# Patient Record
Sex: Female | Born: 1970 | Race: Black or African American | Hispanic: No | Smoking: Never smoker
Health system: Southern US, Community
[De-identification: ages and names within clinical notes are randomized; demographics above are authoritative.]

## PROBLEM LIST (undated history)

## (undated) DIAGNOSIS — N6019 Diffuse cystic mastopathy of unspecified breast: Secondary | ICD-10-CM

## (undated) DIAGNOSIS — Z8742 Personal history of other diseases of the female genital tract: Secondary | ICD-10-CM

## (undated) DIAGNOSIS — N809 Endometriosis, unspecified: Secondary | ICD-10-CM

## (undated) DIAGNOSIS — M179 Osteoarthritis of knee, unspecified: Secondary | ICD-10-CM

## (undated) DIAGNOSIS — E119 Type 2 diabetes mellitus without complications: Secondary | ICD-10-CM

## (undated) DIAGNOSIS — E785 Hyperlipidemia, unspecified: Secondary | ICD-10-CM

## (undated) DIAGNOSIS — K219 Gastro-esophageal reflux disease without esophagitis: Secondary | ICD-10-CM

## (undated) DIAGNOSIS — T7840XA Allergy, unspecified, initial encounter: Secondary | ICD-10-CM

## (undated) DIAGNOSIS — M1710 Unilateral primary osteoarthritis, unspecified knee: Secondary | ICD-10-CM

## (undated) DIAGNOSIS — O1490 Unspecified pre-eclampsia, unspecified trimester: Secondary | ICD-10-CM

## (undated) HISTORY — DX: Diffuse cystic mastopathy of unspecified breast: N60.19

## (undated) HISTORY — DX: Hyperlipidemia, unspecified: E78.5

## (undated) HISTORY — PX: DILATION AND CURETTAGE, DIAGNOSTIC / THERAPEUTIC: SUR384

## (undated) HISTORY — DX: Unspecified pre-eclampsia, unspecified trimester: O14.90

## (undated) HISTORY — DX: Gastro-esophageal reflux disease without esophagitis: K21.9

## (undated) HISTORY — DX: Osteoarthritis of knee, unspecified: M17.9

## (undated) HISTORY — DX: Type 2 diabetes mellitus without complications: E11.9

## (undated) HISTORY — PX: PARTIAL HYSTERECTOMY: SHX80

## (undated) HISTORY — DX: Endometriosis, unspecified: N80.9

## (undated) HISTORY — DX: Allergy, unspecified, initial encounter: T78.40XA

## (undated) HISTORY — DX: Personal history of other diseases of the female genital tract: Z87.42

## (undated) HISTORY — DX: Unilateral primary osteoarthritis, unspecified knee: M17.10

---

## 1998-10-14 ENCOUNTER — Other Ambulatory Visit: Admission: RE | Admit: 1998-10-14 | Discharge: 1998-10-14 | Payer: Self-pay | Admitting: Obstetrics and Gynecology

## 1999-12-17 ENCOUNTER — Other Ambulatory Visit: Admission: RE | Admit: 1999-12-17 | Discharge: 1999-12-17 | Payer: Self-pay | Admitting: Obstetrics and Gynecology

## 2000-11-12 ENCOUNTER — Other Ambulatory Visit: Admission: RE | Admit: 2000-11-12 | Discharge: 2000-11-12 | Payer: Self-pay | Admitting: *Deleted

## 2001-11-15 ENCOUNTER — Other Ambulatory Visit: Admission: RE | Admit: 2001-11-15 | Discharge: 2001-11-15 | Payer: Self-pay | Admitting: *Deleted

## 2002-03-02 ENCOUNTER — Ambulatory Visit (HOSPITAL_COMMUNITY): Admission: RE | Admit: 2002-03-02 | Discharge: 2002-03-02 | Payer: Self-pay | Admitting: Family Medicine

## 2003-02-02 ENCOUNTER — Encounter: Payer: Self-pay | Admitting: Family Medicine

## 2003-02-02 ENCOUNTER — Encounter: Admission: RE | Admit: 2003-02-02 | Discharge: 2003-02-02 | Payer: Self-pay | Admitting: Family Medicine

## 2003-03-12 ENCOUNTER — Other Ambulatory Visit: Admission: RE | Admit: 2003-03-12 | Discharge: 2003-03-12 | Payer: Self-pay | Admitting: *Deleted

## 2004-03-18 ENCOUNTER — Inpatient Hospital Stay (HOSPITAL_COMMUNITY): Admission: AD | Admit: 2004-03-18 | Discharge: 2004-03-18 | Payer: Self-pay | Admitting: Obstetrics and Gynecology

## 2004-03-21 ENCOUNTER — Encounter: Admission: RE | Admit: 2004-03-21 | Discharge: 2004-03-21 | Payer: Self-pay | Admitting: Obstetrics and Gynecology

## 2004-03-27 ENCOUNTER — Encounter: Admission: RE | Admit: 2004-03-27 | Discharge: 2004-04-26 | Payer: Self-pay | Admitting: Obstetrics and Gynecology

## 2004-05-06 ENCOUNTER — Other Ambulatory Visit: Admission: RE | Admit: 2004-05-06 | Discharge: 2004-05-06 | Payer: Self-pay | Admitting: Obstetrics and Gynecology

## 2004-07-01 HISTORY — PX: ABDOMINAL ADHESION SURGERY: SHX90

## 2004-07-22 ENCOUNTER — Ambulatory Visit (HOSPITAL_COMMUNITY): Admission: RE | Admit: 2004-07-22 | Discharge: 2004-07-22 | Payer: Self-pay | Admitting: Obstetrics and Gynecology

## 2005-05-11 ENCOUNTER — Other Ambulatory Visit: Admission: RE | Admit: 2005-05-11 | Discharge: 2005-05-11 | Payer: Self-pay | Admitting: Obstetrics and Gynecology

## 2006-05-03 ENCOUNTER — Encounter: Payer: Self-pay | Admitting: Internal Medicine

## 2006-05-03 LAB — CONVERTED CEMR LAB: Pap Smear: NORMAL

## 2006-05-20 ENCOUNTER — Other Ambulatory Visit: Admission: RE | Admit: 2006-05-20 | Discharge: 2006-05-20 | Payer: Self-pay | Admitting: Obstetrics and Gynecology

## 2008-05-31 ENCOUNTER — Encounter: Payer: Self-pay | Admitting: Internal Medicine

## 2008-05-31 DIAGNOSIS — K219 Gastro-esophageal reflux disease without esophagitis: Secondary | ICD-10-CM

## 2008-05-31 DIAGNOSIS — J45909 Unspecified asthma, uncomplicated: Secondary | ICD-10-CM

## 2008-05-31 DIAGNOSIS — J309 Allergic rhinitis, unspecified: Secondary | ICD-10-CM | POA: Insufficient documentation

## 2008-06-06 ENCOUNTER — Ambulatory Visit: Payer: Self-pay | Admitting: Internal Medicine

## 2008-06-06 DIAGNOSIS — J069 Acute upper respiratory infection, unspecified: Secondary | ICD-10-CM | POA: Insufficient documentation

## 2008-06-27 ENCOUNTER — Telehealth (INDEPENDENT_AMBULATORY_CARE_PROVIDER_SITE_OTHER): Payer: Self-pay | Admitting: *Deleted

## 2008-06-27 DIAGNOSIS — N63 Unspecified lump in unspecified breast: Secondary | ICD-10-CM

## 2008-07-04 ENCOUNTER — Ambulatory Visit: Payer: Self-pay | Admitting: Internal Medicine

## 2008-07-04 LAB — HM MAMMOGRAPHY

## 2009-02-05 ENCOUNTER — Ambulatory Visit: Payer: Self-pay | Admitting: Internal Medicine

## 2009-02-05 DIAGNOSIS — R21 Rash and other nonspecific skin eruption: Secondary | ICD-10-CM | POA: Insufficient documentation

## 2009-02-05 DIAGNOSIS — J209 Acute bronchitis, unspecified: Secondary | ICD-10-CM

## 2009-07-17 ENCOUNTER — Ambulatory Visit: Payer: Self-pay | Admitting: Internal Medicine

## 2009-07-17 DIAGNOSIS — G4710 Hypersomnia, unspecified: Secondary | ICD-10-CM | POA: Insufficient documentation

## 2009-07-17 DIAGNOSIS — H1030 Unspecified acute conjunctivitis, unspecified eye: Secondary | ICD-10-CM | POA: Insufficient documentation

## 2009-07-18 LAB — CONVERTED CEMR LAB
Albumin: 3.7 g/dL (ref 3.5–5.2)
Alkaline Phosphatase: 53 units/L (ref 39–117)
BUN: 9 mg/dL (ref 6–23)
Basophils Relative: 0 % (ref 0.0–3.0)
Bilirubin Urine: NEGATIVE
CO2: 26 meq/L (ref 19–32)
Calcium: 9.3 mg/dL (ref 8.4–10.5)
Glucose, Bld: 89 mg/dL (ref 70–99)
HCT: 41.3 % (ref 36.0–46.0)
HDL: 67.9 mg/dL (ref >39.00)
Hemoglobin, Urine: NEGATIVE
Ketones, ur: NEGATIVE mg/dL
LDL Cholesterol: 45 mg/dL (ref 0–99)
Lymphocytes Relative: 42.1 % (ref 12.0–46.0)
Lymphs Abs: 3.1 10*3/uL (ref 0.7–4.0)
MCHC: 34.2 g/dL (ref 30.0–36.0)
Monocytes Relative: 6.5 % (ref 3.0–12.0)
Neutro Abs: 3.7 10*3/uL (ref 1.4–7.7)
Neutrophils Relative %: 50.3 % (ref 43.0–77.0)
Platelets: 310 10*3/uL (ref 150.0–400.0)
RBC: 4.68 M/uL (ref 3.87–5.11)
RDW: 12 % (ref 11.5–14.6)
Sodium: 141 meq/L (ref 135–145)
TSH: 1.49 microintl units/mL (ref 0.35–5.50)
Total Bilirubin: 0.6 mg/dL (ref 0.3–1.2)
Total CHOL/HDL Ratio: 2
Total Protein, Urine: NEGATIVE mg/dL
Triglycerides: 144 mg/dL (ref 0.0–149.0)
Urine Glucose: NEGATIVE mg/dL

## 2009-07-30 ENCOUNTER — Ambulatory Visit: Payer: Self-pay | Admitting: Pulmonary Disease

## 2009-08-20 ENCOUNTER — Ambulatory Visit (HOSPITAL_BASED_OUTPATIENT_CLINIC_OR_DEPARTMENT_OTHER): Admission: RE | Admit: 2009-08-20 | Discharge: 2009-08-20 | Payer: Self-pay | Admitting: Pulmonary Disease

## 2009-08-20 ENCOUNTER — Encounter: Payer: Self-pay | Admitting: Pulmonary Disease

## 2009-08-26 ENCOUNTER — Ambulatory Visit: Payer: Self-pay | Admitting: Pulmonary Disease

## 2009-11-29 ENCOUNTER — Ambulatory Visit (HOSPITAL_COMMUNITY): Admission: RE | Admit: 2009-11-29 | Discharge: 2009-11-29 | Payer: Self-pay | Admitting: Obstetrics and Gynecology

## 2009-11-29 ENCOUNTER — Encounter (INDEPENDENT_AMBULATORY_CARE_PROVIDER_SITE_OTHER): Payer: Self-pay | Admitting: Obstetrics and Gynecology

## 2010-02-21 ENCOUNTER — Ambulatory Visit: Payer: Self-pay | Admitting: Internal Medicine

## 2010-02-21 DIAGNOSIS — M545 Low back pain, unspecified: Secondary | ICD-10-CM | POA: Insufficient documentation

## 2010-02-21 DIAGNOSIS — M62838 Other muscle spasm: Secondary | ICD-10-CM

## 2010-02-21 DIAGNOSIS — R10819 Abdominal tenderness, unspecified site: Secondary | ICD-10-CM

## 2010-02-21 DIAGNOSIS — R509 Fever, unspecified: Secondary | ICD-10-CM

## 2010-02-21 LAB — CONVERTED CEMR LAB
Bilirubin Urine: NEGATIVE
Ketones, ur: NEGATIVE mg/dL
Leukocytes, UA: NEGATIVE
Nitrite: NEGATIVE
Specific Gravity, Urine: 1.015 (ref 1.000–1.030)
Total Protein, Urine: NEGATIVE mg/dL
Urobilinogen, UA: 0.2 (ref 0.0–1.0)

## 2010-04-07 ENCOUNTER — Telehealth (INDEPENDENT_AMBULATORY_CARE_PROVIDER_SITE_OTHER): Payer: Self-pay | Admitting: *Deleted

## 2010-04-21 ENCOUNTER — Encounter
Admission: RE | Admit: 2010-04-21 | Discharge: 2010-04-21 | Payer: Self-pay | Admitting: Physical Medicine and Rehabilitation

## 2010-07-07 ENCOUNTER — Ambulatory Visit: Payer: Self-pay | Admitting: Internal Medicine

## 2010-07-07 LAB — CONVERTED CEMR LAB
Albumin: 3.7 g/dL (ref 3.5–5.2)
Alkaline Phosphatase: 55 units/L (ref 39–117)
Basophils Absolute: 0 10*3/uL (ref 0.0–0.1)
Basophils Relative: 0.4 % (ref 0.0–3.0)
Bilirubin Urine: NEGATIVE
CO2: 26 meq/L (ref 19–32)
Chloride: 107 meq/L (ref 96–112)
Cholesterol: 152 mg/dL (ref 0–200)
Creatinine, Ser: 1 mg/dL (ref 0.4–1.2)
Eosinophils Absolute: 0.1 10*3/uL (ref 0.0–0.7)
HDL: 47.2 mg/dL (ref >39.00)
Hemoglobin: 14.5 g/dL (ref 12.0–15.0)
Leukocytes, UA: NEGATIVE
Lymphocytes Relative: 38.3 % (ref 12.0–46.0)
Lymphs Abs: 2.7 10*3/uL (ref 0.7–4.0)
MCHC: 34.5 g/dL (ref 30.0–36.0)
MCV: 89.8 fL (ref 78.0–100.0)
Platelets: 319 10*3/uL (ref 150.0–400.0)
Sodium: 142 meq/L (ref 135–145)
TSH: 1.49 microintl units/mL (ref 0.35–5.50)
Triglycerides: 73 mg/dL (ref 0.0–149.0)
Urobilinogen, UA: 0.2 (ref 0.0–1.0)
VLDL: 14.6 mg/dL (ref 0.0–40.0)
WBC: 7 10*3/uL (ref 4.5–10.5)
pH: 7 (ref 5.0–8.0)

## 2010-07-14 ENCOUNTER — Ambulatory Visit: Payer: Self-pay | Admitting: Internal Medicine

## 2010-07-14 DIAGNOSIS — M1710 Unilateral primary osteoarthritis, unspecified knee: Secondary | ICD-10-CM

## 2010-09-30 NOTE — Assessment & Plan Note (Signed)
Summary: 5 mos f/u // cd   Vital Signs:  Patient profile:   40 year old female Height:      64.5 inches Weight:      196.25 pounds BMI:     33.29 O2 Sat:      98 % on Room air Temp:     98.9 degrees F oral Pulse rate:   87 / minute BP sitting:   106 / 70  (left arm) Cuff size:   regular  Vitals Entered By: Zella Ball Ewing CMA Duncan Dull) (July 14, 2010 4:06 PM)  O2 Flow:  Room air  CC: 5 month followup/RE   Primary Care Provider:  Corwin Levins MD  CC:  5 month followup/RE.  History of Present Illness: here for wellness, overall doing well,  Pt denies CP, worsening sob, doe, wheezing, orthopnea, pnd, worsening LE edema, palps, dizziness or syncope  Pt denies new neuro symptoms such as headache, facial or extremity weakness  Pt denies polydipsia, polyuria   Overall good compliance with meds, trying to follow low chol diet, wt stable, little excercise however No fever, wt loss, night sweats, loss of appetite or other constitutional symptoms  Denies worsening depressive symptoms, suicidal ideation, or panic.  Pt states good ability with ADL's, low fall risk, home safety reviewed and adequate, no significant change in hearing or vision, trying to follow lower chol diet, and occasionally active only with regular excercise.   Preventive Screening-Counseling & Management      Drug Use:  no.    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 2 Puffs Qid As Needed 2)  Xyzal 5 Mg Tabs (Levocetirizine Dihydrochloride) .Marland Kitchen.. 1 By Mouth Once Daily As Needed 3)  Advair Diskus 250-50 Mcg/dose Misc (Fluticasone-Salmeterol) .Marland Kitchen.. 1 Puff Two Times A Day As Needed 4)  Fluticasone Propionate 50 Mcg/act Susp (Fluticasone Propionate) .... 2 Spray/side Once Daily As Needed 5)  Gianvi 3-0.02 Mg Tabs (Drospirenone-Ethinyl Estradiol) .... Use As Directed 6)  Tramadol Hcl 50 Mg Tabs (Tramadol Hcl) .Marland Kitchen.. 1-2 By Mouth Q 6 Hrs As Needed 7)  Carisoprodol 350 Mg Tabs (Carisoprodol) .Marland Kitchen.. 1 By Mouth Q 6 Hrs As Needed Spasm 8)  Prednisone 10 Mg Tabs (Prednisone) .... 3po Qd For 3days, Then 2po Qd For 3days, Then 1po Qd For 3days, Then Stop  Current Medications (verified): 1)  Proair Hfa 108 (90 Base) Mcg/act Aers (Albuterol Sulfate) .... 2 Puffs Qid As Needed 2)  Xyzal 5 Mg Tabs (Levocetirizine Dihydrochloride) .Marland Kitchen.. 1 By Mouth Once Daily As Needed 3)  Advair Diskus 250-50 Mcg/dose Misc (Fluticasone-Salmeterol) .Marland Kitchen.. 1 Puff Two Times A Day As Needed 4)  Fluticasone Propionate 50 Mcg/act Susp (Fluticasone Propionate) .... 2 Spray/side Once Daily As Needed 5)  Tramadol Hcl 50 Mg Tabs (Tramadol Hcl) .Marland Kitchen.. 1-2 By Mouth Q 6 Hrs As Needed 6)  Norethindrone Acetate 5 Mg Tabs (Norethindrone Acetate) .Marland Kitchen.. 1 By Mouth  Once Daily  Allergies (verified): 1)  ! Sulfa 2)  ! 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 Married Drug use-no  Risk  Factors: Smoking Status: never (05/31/2008)  Past Medical History: Allergic rhinitis GERD fibrocystic breast disease Asthma hx of preeclampsia hx of endometriosis left knee DJD  Social History: Never Smoked 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         all otherwise negative per pt -    Physical Exam  General:  alert and overweight-appearing.  , not ill appearing Head:  normocephalic and atraumatic.   Eyes:  vision grossly intact, pupils equal, and pupils round.   Ears:  R ear normal and L ear normal.   Nose:  no external deformity and no nasal discharge.   Mouth:  no gingival abnormalities and pharynx pink and moist.   Neck:  supple and no masses.   Lungs:  normal respiratory effort and normal breath sounds.   Heart:  normal rate and regular rhythm.   Abdomen:  soft and normal bowel sounds.  non-tender, no hepatomegaly, and no splenomegaly.   Msk:  no joint tenderness and no joint swelling., mild left knee crepitus noted Extremities:  no edema, no erythema  Neurologic:  cranial nerves II-XII intact, strength normal in all extremities, and gait normal.   Skin:  color normal and no rashes.   Psych:  not anxious appearing and not depressed appearing.     Impression & Recommendations:  Problem # 1:  Preventive Health Care (ICD-V70.0) Overall doing well, age appropriate education and counseling updated, referral for preventive services and immunizations addressed, dietary  counseling and smoking status adressed , most recent labs reviewed I have personally reviewed and have noted 1.The patient's medical and social history 2.Their use of alcohol, tobacco or illicit drugs 3.Their current medications and supplements 4. Functional ability including ADL's, fall risk, home safety risk, hearing & visual impairment  5.Diet and physical activities 6.Evidence for depression or mood disorders The patients weight, height, BMI  have been recorded in the chart I have made referrals, counseling and provided education to the patient based review of the above   Problem # 2:  OSTEOARTHRITIS, KNEE, LEFT (ICD-715.96)  The following medications were removed from the medication list:    Carisoprodol 350 Mg Tabs (Carisoprodol) .Marland Kitchen... 1 by mouth q 6 hrs as needed spasm Her updated medication list for this problem includes:    Tramadol Hcl 50 Mg Tabs (Tramadol hcl) .Marland Kitchen... 1-2 by mouth q 6 hrs as needed treat as above, f/u any worsening signs or symptoms   Complete Medication List: 1)  Proair Hfa 108 (90 Base) Mcg/act Aers (Albuterol sulfate) .... 2 puffs qid as needed 2)  Xyzal 5 Mg Tabs (Levocetirizine dihydrochloride) .Marland Kitchen.. 1 by mouth once daily as needed 3)  Advair Diskus 250-50 Mcg/dose Misc (Fluticasone-salmeterol) .Marland Kitchen.. 1 puff two times a day as needed 4)  Fluticasone Propionate 50 Mcg/act Susp (Fluticasone propionate) .... 2 spray/side once daily as needed 5)  Tramadol Hcl 50 Mg Tabs (Tramadol hcl) .Marland Kitchen.. 1-2 by mouth q 6 hrs as needed 6)  Norethindrone Acetate 5 Mg Tabs (Norethindrone acetate) .Marland Kitchen.. 1 by mouth once daily  Patient Instructions: 1)  Continue all previous medications as before this visit  2)  Your blood work is forwarded to your GYN 3)  Your prevention is up to date 4)  Please follow up with GYN for further bleeding 5)  Please schedule a follow-up appointment in 1 year, or sooner if needed. with CPX and labs Prescriptions: TRAMADOL HCL 50 MG TABS (TRAMADOL HCL)  1-2 by mouth q 6 hrs as needed  #60 x 2   Entered and Authorized by:   Corwin Levins MD   Signed by:   Corwin Levins MD on 07/14/2010   Method used:   Electronically to        [facility]  [address] [phone]* (retail)       [address]       [place], [place]  [address]       Ph: [phone]       Fax: [phone]   RxID:   [id number] ADVAIR DISKUS 250-50 MCG/DOSE MISC (FLUTICASONE-SALMETEROL) 1 puff two times a day as needed  #1 x 11   Entered and Authorized by:   Corwin Levins MD   Signed by:   Corwin Levins MD on 07/14/2010   Method used:   [facility]ronically to        [facility]  [address] [phone]* (retail)       [address]       [place], [place]  [address]       Ph: [phone]       Fax: [phone]   RxID:   [id number] PROAIR HFA 108 (90 BASE) MCG/ACT AERS (ALBUTEROL SULFATE) 2 puffs qid as needed  #1 x 11   Entered and Authorized by:   Corwin Levins MD   Signed by:   Corwin Levins MD on 07/14/2010   Method used:   [facility] to        [facility]  [address] [phone]* (retail)       [address]       [place], [place]  [address]       Ph: [phone]       Fax: [phone]   RxID:   [id number]    Orders Added: 1)  Est. Patient 18-39 years [[id number]]

## 2010-09-30 NOTE — Assessment & Plan Note (Signed)
Summary: HIP PAIN   MUSCLE SPASMS--STC   Vital Signs:  Patient profile:   40 year old female Height:      65 inches Weight:      195.50 pounds BMI:     32.65 O2 Sat:      99 % on Room air Temp:     99.8 degrees F oral Pulse rate:   92 / minute BP sitting:   122 / 84  (left arm) Cuff size:   regular  Vitals Entered By: Zella Ball Ewing CMA Duncan Dull) (February 21, 2010 2:40 PM)  O2 Flow:  Room air CC: left hip pain/RE   Primary Care Provider:  Corwin Levins MD  CC:  left hip pain/RE.  History of Present Illness: here after slipped on treadmill in august 2010 but did not fall but was jerked to the left; now with recurrent Left lower back pain for one year, exepcially worse in the past 1 wk, now moderate with some radiation to the left post and lat thigh area (not below the knee);  pain worse to get up from sitting and not assoc with worsening LE pain otherwise, numbness or weakness;  no bowel or bladder changes, no gait change or fall,  no subjective fever, wt loss, night sweats, or other constitutional symtpoms.  Did have some tingling to left toes today but not sure if related.  Alleve helps pain.  Better to lie down or sit as well.  She is unaware of any fever today, chills n/v abd pain.   Does c/o also of intermittent muscular cramps that seem to be random, but persitent since prior to last visit as well, often to the calves, but several to the right upper arm as well.  Pt denies CP, sob, doe, wheezing, orthopnea, pnd, worsening LE edema, palps, dizziness or syncope  Pt denies new neuro symptoms such as headache, facial or extremity weakness  No recent reduced by mouth intake.  had similar symptoms nov 2010 and labs reviewed with pt.  Needs inhaler refill, but no sob, wheezing or nighttime awakening.  Also specifically denies GU symtpoms such as pain on urination, urgency or freq.  No t currently having menses.  NO bowel changes or constipation.    Problems Prior to Update: 1)  Abdominal Tenderness   (ICD-789.60) 2)  Back Pain, Lumbar  (ICD-724.2) 3)  Hypersomnia  (ICD-780.54) 4)  Conjunctivitis, Acute, Right  (ICD-372.00) 5)  Preventive Health Care  (ICD-V70.0) 6)  Rash-nonvesicular  (ICD-782.1) 7)  Acute Bronchitis  (ICD-466.0) 8)  Breast Mass  (ICD-611.72) 9)  Uri  (ICD-465.9) 10)  Asthma  (ICD-493.90) 11)  Gerd  (ICD-530.81) 12)  Allergic Rhinitis  (ICD-477.9)  Medications Prior to Update: 1)  Proair Hfa 108 (90 Base) Mcg/act Aers (Albuterol Sulfate) .... 2 Puffs Qid As Needed 2)  Xyzal 5 Mg Tabs (Levocetirizine Dihydrochloride) .Marland Kitchen.. 1 By Mouth Once Daily As Needed 3)  Advair Diskus 250-50 Mcg/dose Misc (Fluticasone-Salmeterol) .Marland Kitchen.. 1 Puff Two Times A Day As Needed 4)  Fluticasone Propionate 50 Mcg/act Susp (Fluticasone Propionate) .... 2 Spray/side Once Daily As Needed 5)  Gianvi 3-0.02 Mg Tabs (Drospirenone-Ethinyl Estradiol) .... Use As Directed  Current Medications (verified): 1)  Proair Hfa 108 (90 Base) Mcg/act Aers (Albuterol Sulfate) .... 2 Puffs Qid As Needed 2)  Xyzal 5 Mg Tabs (Levocetirizine Dihydrochloride) .Marland Kitchen.. 1 By Mouth Once Daily As Needed 3)  Advair Diskus 250-50 Mcg/dose Misc (Fluticasone-Salmeterol) .Marland Kitchen.. 1 Puff Two Times A Day As Needed 4)  Fluticasone  Propionate 50 Mcg/act Susp (Fluticasone Propionate) .... 2 Spray/side Once Daily As Needed 5)  Gianvi 3-0.02 Mg Tabs (Drospirenone-Ethinyl Estradiol) .... Use As Directed 6)  Tramadol Hcl 50 Mg Tabs (Tramadol Hcl) .Marland Kitchen.. 1-2 By Mouth Q 6 Hrs As Needed 7)  Carisoprodol 350 Mg Tabs (Carisoprodol) .Marland Kitchen.. 1 By Mouth Q 6 Hrs As Needed Spasm 8)  Prednisone 10 Mg Tabs (Prednisone) .... 3po Qd For 3days, Then 2po Qd For 3days, Then 1po Qd For 3days, Then Stop  Allergies (verified): 1)  ! Sulfa 2)  ! 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  , not ill appearing Head:  normocephalic and atraumatic.   Eyes:  vision grossly intact, pupils equal, and pupils round.   Ears:  R ear normal and L ear normal.   Nose:  no external deformity and no nasal discharge.   Mouth:  no gingival abnormalities and pharynx pink and moist.   Neck:  supple and no masses.   Lungs:  normal respiratory effort and normal breath sounds.   Heart:  normal rate and regular rhythm.   Abdomen:  soft and normal bowel sounds.  , with lower mid abd tender without guarding or rebound Msk:  no joint tenderness and no joint swelling.  , but has tender over left SI joint area, no spine tender or significant paravertebral tenderness except for marked left lumbar paravertebral spasm/tender Pulses:  2+ LE's Extremities:  no edema, no erythema  Neurologic:  cranial nerves II-XII intact, strength normal in lowerextremities, and sensation intact to light touc except for ? mild prox LE weakness to hip flexion but exam limited due to pain   Impression & Recommendations:  Problem # 1:  BACK PAIN, LUMBAR (ICD-724.2)  c/w left sciatica with ? mild LLE weakness , but also has lumbar left spasm by exam , doubt needs MRI at this time - for medical tx for now, f/u any worsening symptoms  Her updated medication list for this problem includes:    Tramadol Hcl 50 Mg Tabs (Tramadol hcl) .Marland Kitchen... 1-2 by mouth q 6 hrs as needed    Carisoprodol 350 Mg Tabs (Carisoprodol) .Marland Kitchen... 1 by mouth q 6 hrs as needed spasm  Problem # 2:  MUSCLE SPASM (ICD-728.85) Assessment: Unchanged  suspect  due to overall deconditioning,  labs revewed with pt, declines repeat at this time;  tx with different muscle relaxer  Problem # 3:  ABDOMINAL TENDERNESS (ICD-789.60)  low mid abd, with fever but o/w asympt  - for urine studies, and antibx pending urine studies  Orders: T-Culture, Urine ([id number]) TLB-Udip w/ Micro (81001-URINE)  Problem # 4:  FEVER UNSPECIFIED (ICD-780.60) Assessment: Unchanged low grade, exam o/w benign except for above, ok to follow  Complete Medication List: 1)  Proair Hfa 108 (90 Base) Mcg/act Aers (Albuterol sulfate) .... 2 puffs qid as needed 2)  Xyzal 5 Mg Tabs (Levocetirizine dihydrochloride) .Marland Kitchen.. 1 by mouth once daily as needed 3)  Advair Diskus 250-50 Mcg/dose Misc (Fluticasone-salmeterol) .Marland KitchenMarland KitchenMarland Kitchen  1 puff two times a day as needed 4)  Fluticasone Propionate 50 Mcg/act Susp (Fluticasone propionate) .... 2 spray/side once daily as needed 5)  Gianvi 3-0.02 Mg Tabs (Drospirenone-ethinyl estradiol) .... Use as directed 6)  Tramadol Hcl 50 Mg Tabs (Tramadol hcl) .Marland Kitchen.. 1-2 by mouth q 6 hrs as needed 7)  Carisoprodol 350 Mg Tabs (Carisoprodol) .Marland Kitchen.. 1 by mouth q 6 hrs as needed spasm 8)  Prednisone 10 Mg Tabs (Prednisone) .... 3po qd for 3days, then 2po qd for 3days, then 1po qd for 3days, then stop  Patient Instructions: 1)  Please take all new medications as prescribed 2)  Continue all previous medications as before this visit  3)  Please go to the Lab in the basement for your urine tests today 4)  Please schedule a follow-up appointment in 5 months with CPX labs Prescriptions: ADVAIR DISKUS 250-50 MCG/DOSE MISC (FLUTICASONE-SALMETEROL) 1 puff two times a day as needed  #1 x 11   Entered and Authorized by:   Corwin Levins MD   Signed by:   Corwin Levins MD on 02/22/2010   Method used:   [facility] to        [facility]  [address] [phone]* (retail)       [address]       [place], [place]  [address]       Ph: [phone]       Fax: [phone]   RxID:    [id number] PROAIR HFA 108 (90 BASE) MCG/ACT AERS (ALBUTEROL SULFATE) 2 puffs qid as needed  #1 x 11   Entered and Authorized by:   Corwin Levins MD   Signed by:   Corwin Levins MD on 02/22/2010   Method used:   [facility]        [facility]  [address] [id number] ([facility]ail)       [address]       [place], [place]  [address]       Ph: [phone]       Fax: [phone]   RxID:   [id number] PREDNISONE 10 MG TABS (PREDNISONE) 3po qd for 3days, then 2po qd for 3days, then 1po qd for 3days, then stop  #18 x 0   Entered and Authorized by:   Corwin Levins MD   Signed by:   Corwin Levins MD on 02/21/2010   Method used:   Print then Give to Patient   RxID:   [id number] CARISOPRODOL 350 MG TABS (CARISOPRODOL) 1 by mouth q 6 hrs as needed spasm  #60 x 2   Entered and Authorized by:   Corwin Levins MD   Signed by:   Corwin Levins MD on 02/21/2010   Method used:   Print then Give to Patient   RxID:   [id number] TRAMADOL HCL 50 MG TABS (TRAMADOL HCL) 1-2 by mouth q 6 hrs as needed  #60 x 2   Entered and Authorized by:   Corwin Levins MD   Signed by:   Corwin Levins MD on 02/21/2010   Method used:   Print then Give to Patient   RxID:   [id number]

## 2010-09-30 NOTE — Progress Notes (Signed)
  Phone Note Other Incoming   Request: Send information Action Taken: Software engineer of Call: Request for records received from [employer]. Request forwarded to [facility].

## 2010-11-23 LAB — CBC
Hemoglobin: 14.5 g/dL (ref 12.0–15.0)
RBC: 4.83 MIL/uL (ref 3.87–5.11)
RDW: 13.6 % (ref 11.5–15.5)

## 2010-11-23 LAB — PREGNANCY, URINE: Preg Test, Ur: NEGATIVE

## 2010-12-24 ENCOUNTER — Telehealth: Payer: Self-pay

## 2010-12-24 MED ORDER — FLUTICASONE PROPIONATE 50 MCG/ACT NA SUSP: 2.0000 | Freq: Every day | NASAL | Status: DC

## 2010-12-24 MED ORDER — FEXOFENADINE HCL 180 MG PO TABS: 180.0000 mg | ORAL_TABLET | Freq: Every day | ORAL | Status: DC

## 2010-12-24 NOTE — Telephone Encounter (Signed)
Pt called stating she has been experiencing severe allergy sxs x 1-2 months. Pt says she has tried several OTC medication with no help and is requesting prescription strength medication to pharmacy

## 2010-12-24 NOTE — Telephone Encounter (Signed)
Done per emr 

## 2010-12-25 NOTE — Telephone Encounter (Signed)
Pt advised of Rxs and pharmacy

## 2010-12-26 ENCOUNTER — Encounter: Payer: Self-pay | Admitting: Internal Medicine

## 2010-12-26 ENCOUNTER — Ambulatory Visit (INDEPENDENT_AMBULATORY_CARE_PROVIDER_SITE_OTHER): Payer: BC Managed Care – PPO | Admitting: Internal Medicine

## 2010-12-26 VITALS — BP 106/78 | HR 97 | Temp 99.0°F | Ht 65.0 in | Wt 193.2 lb

## 2010-12-26 DIAGNOSIS — M629 Disorder of muscle, unspecified: Secondary | ICD-10-CM

## 2010-12-26 DIAGNOSIS — Z0001 Encounter for general adult medical examination with abnormal findings: Secondary | ICD-10-CM | POA: Insufficient documentation

## 2010-12-26 DIAGNOSIS — Z0000 Encounter for general adult medical examination without abnormal findings: Secondary | ICD-10-CM | POA: Insufficient documentation

## 2010-12-26 DIAGNOSIS — J45909 Unspecified asthma, uncomplicated: Secondary | ICD-10-CM

## 2010-12-26 DIAGNOSIS — M6289 Other specified disorders of muscle: Secondary | ICD-10-CM

## 2010-12-26 DIAGNOSIS — J019 Acute sinusitis, unspecified: Secondary | ICD-10-CM | POA: Insufficient documentation

## 2010-12-26 DIAGNOSIS — J309 Allergic rhinitis, unspecified: Secondary | ICD-10-CM

## 2010-12-26 MED ORDER — CYCLOBENZAPRINE HCL 5 MG PO TABS: 5.0000 mg | ORAL_TABLET | Freq: Three times a day (TID) | ORAL | Status: AC | PRN

## 2010-12-26 MED ORDER — CEPHALEXIN 500 MG PO CAPS: 500.0000 mg | ORAL_CAPSULE | Freq: Four times a day (QID) | ORAL | Status: AC

## 2010-12-26 NOTE — Patient Instructions (Addendum)
Take all new medications as prescribed - the antibiotic, and the muscle relaxer Continue all other medications as before Please return in Nov 2012 with Lab testing done 3-5 days before

## 2010-12-27 ENCOUNTER — Encounter: Payer: Self-pay | Admitting: Internal Medicine

## 2010-12-27 NOTE — Assessment & Plan Note (Signed)
stable overall by hx and exam, most recent lab reviewed with pt, and pt to continue medical treatment as before Lab Results  Component Value Date   WBC 7.0 07/07/2010   HGB 14.5 07/07/2010   HCT 42.0 07/07/2010   PLT 319.0 07/07/2010   CHOL 152 07/07/2010   TRIG 73.0 07/07/2010   HDL 47.20 07/07/2010   ALT 19 07/07/2010   AST 21 07/07/2010   NA 142 07/07/2010   K 5.2* 07/07/2010   CL 107 07/07/2010   CREATININE 1.0 07/07/2010   BUN 8 07/07/2010   CO2 26 07/07/2010   TSH 1.49 07/07/2010

## 2010-12-27 NOTE — Assessment & Plan Note (Signed)
Mild to mod, for antibx course,  to f/u any worsening symptoms or concerns 

## 2010-12-27 NOTE — Progress Notes (Signed)
Subjective:    Patient ID: Claire Irwin, female    DOB: Jan 16, 1971, 40 y.o.   MRN: [id number]  HPI   Here with 3 days acute onset fever, facial pain, pressure, general weakness and malaise, and greenish d/c, with slight ST, but little to no cough and Pt denies chest pain, increased sob or doe, wheezing, orthopnea, PND, increased LE swelling, palpitations, dizziness or syncope.  Also with hamstring tightness and spasm occasionally to the left post thigh con's to recur over the last 6 mo for no apparent reason such as overexertion, repetition movement, trauma.; overall mild but cont;s to be bothersome.  Pt denies recurring LBP or bowel or bladder change, fever, wt loss,  worsening LE pain/numbness/weakness, gait change or falls.  Also with mild increased nasal allergy symptoms over the past month, without wheezing or night time awakening, overall well controlled with more regular use of her allergy meds.  Pt denies chest pain, increased sob or doe, wheezing, orthopnea, PND, increased LE swelling, palpitations, dizziness or syncope.  Pt denies new neurological symptoms such as new headache, or facial or extremity weakness or numbness   Pt denies polydipsia, polyuria.  Overall good compliance with treatment, and good medicine tolerability.  Past Medical History  Diagnosis Date  . Allergy   . GERD (gastroesophageal reflux disease)   . Asthma   . Fibrocystic breast disease   . History of endometriosis   . Preeclampsia   . DJD (degenerative joint disease) of knee     LEFT   Past Surgical History  Procedure Date  . Cesarean section 02/2004  . Dilation and curettage, diagnostic / therapeutic   . Abdominal adhesion surgery 07/2004    reports that she has never smoked. She does not have any smokeless tobacco history on file. She reports that she does not drink alcohol or use illicit drugs. family history includes Allergies in her maternal grandmother and mother; Asthma in her maternal grandmother  and mother; Cancer in her paternal grandmother; Diabetes in her father and mother; Heart disease in her paternal uncle; and Hypertension in her mother. Allergies  Allergen Reactions  . Hydrocodone   . Sulfonamide Derivatives    Current Outpatient Prescriptions on File Prior to Visit  Medication Sig Dispense Refill  . fexofenadine (ALLEGRA) 180 MG tablet Take 1 tablet (180 mg total) by mouth daily.  30 tablet  2  . fluticasone (FLONASE) 50 MCG/ACT nasal spray 2 sprays by Nasal route daily.  16 g  2   Review of Systems Review of Systems  Constitutional: Negative for diaphoresis and unexpected weight change.  HENT: Negative for drooling and tinnitus.   Eyes: Negative for photophobia and visual disturbance.  Respiratory: Negative for choking and stridor.   Gastrointestinal: Negative for vomiting and blood in stool.  Genitourinary: Negative for hematuria and decreased urine volume.  Musculoskeletal: Negative for gait problem.  Skin: Negative for color change and wound.  Neurological: Negative for tremors and numbness.  Psychiatric/Behavioral: Negative for decreased concentration. The patient is not hyperactive.       Objective:   Physical Exam BP 106/78  Pulse 97  Temp(Src) 99 F (37.2 C) (Oral)  Ht 5\' 5"  (1.651 m)  Wt 193 lb 4 oz (87.658 kg)  BMI 32.16 kg/m2  SpO2 98% Physical Exam  VS noted Constitutional: Pt appears well-developed and well-nourished.  HENT: Head: Normocephalic.  Bilat tm's mild erythema.  Sinus tender.  Pharynx mild erythema Right Ear: External ear normal.  Left Ear: External ear normal.  Eyes: Conjunctivae and EOM are normal. Pupils are equal, round, and reactive to light.  Neck: Normal range of motion. Neck supple.  Cardiovascular: Normal rate and regular rhythm.   Pulmonary/Chest: Effort normal and breath sounds normal.  Abd:  Soft, NT, non-distended, + BS Neurological: Pt is alert. No cranial nerve deficit.  Skin: Skin is warm. No erythema.    Psychiatric: Pt behavior is normal. Thought content normal. 1+ nervous MSK:  nontender posterior left thigh , no current spasm,  Has FROM without pain to left hip or knee, no effusion       Assessment & Plan:

## 2010-12-27 NOTE — Assessment & Plan Note (Signed)
Mild, current exam benign, for flexeril prn,  to f/u any worsening symptoms or concerns

## 2010-12-27 NOTE — Assessment & Plan Note (Signed)
stable overall by hx and exam, most recent lab reviewed with pt, and pt to continue medical treatment as before 

## 2011-01-16 NOTE — H&P (Signed)
Claire Irwin, Claire Irwin              ACCOUNT NO.:  [id number]   MEDICAL RECORD NO.:  [id number]          PATIENT TYPE:  AMB   LOCATION:  SDC                           FACILITY:  [facility]   PHYSICIAN:  Zenaida Niece, M.D.DATE OF BIRTH:  09/08/1970   DATE OF ADMISSION:  DATE OF DISCHARGE:                                HISTORY & PHYSICAL   DATE OF ADMISSION:  July 22, 2004   CHIEF COMPLAINT:  Pelvic pain.   HISTORY OF PRESENT ILLNESS:  This is a 40 year old black female gravida 2  para 1-0-1-2 who had a cesarean section for twins on March 23, 2004 at  [facility].  She was known prior to and during pregnancy to have an  irregularity in her posterior cul-de-sac, but this was not causing any  problems.  She had a postpartum exam on September 6 and at that time was  doing well.  However, she presented to the office on October 24 with  increasing vaginal and rectal pain for the past 2 weeks which was gradually  getting worse.  Ibuprofen was helping but she was having to take it on a  regular basis, as it would wear off.  Exam revealed cervical motion  tenderness and a tender cervix with a tender mass in the posterior left cul-  de-sac.  She is being admitted for laparoscopic evaluation of this  mass/tenderness and possible laparotomy.   PAST OBSTETRICAL HISTORY:  Significant for the recent delivery of twins by  cesarean section, and she has one prior spontaneous miscarriage treated with  a D&C.   PAST MEDICAL HISTORY:  Mild asthma, gastroesophageal reflux disease, and  hemoglobin AS.   PAST SURGICAL HISTORY:  Significant only for the [facility] and her recent cesarean  section.   ALLERGIES:  None known.   CURRENT MEDICATIONS:  She received Depo-Provera on October 19.   SOCIAL HISTORY:  The patient is married and denies alcohol, tobacco, or drug  use.   FAMILY HISTORY:  Noncontributory.   PHYSICAL EXAMINATION:  GENERAL:  This is a well-developed, well-nourished  black female  who is in no acute distress.  Last weight in the office was 170  pounds.  NECK:  Supple without lymphadenopathy or thyromegaly.  LUNGS:  Clear to auscultation.  HEART:  Regular rate and rhythm without murmur.  ABDOMEN:  Soft, nondistended, without palpable masses and she has a well-  healed transverse scar.  EXTREMITIES:  Have no edema and are nontender.  PELVIC:  External genitalia is without lesions.  Again, on bimanual exam she  has cervical motion tenderness, a tender cervix, and a tender mass on the  posterior left cul-de-sac.  She is tender in her left adnexa but okay on the  right.  The uterus seems normal size.   ASSESSMENT:  Pelvic pain with irregularity/mass in the left posterior cul-de-  sac.  This irregularity has been known to exist for some time, even before  her pregnancy when she was followed by Dr. Randell Patient.  However, it is now  becoming uncomfortable.  All options were discussed with the patient and she  wants  surgical evaluation.  Risks of surgery including bleeding, infection,  and damage to surrounding organs have been discussed with the patient, and  she understands.   PLAN:  Admit the patient for a diagnostic laparoscopy with possible lysis of  adhesions and fulguration of endometriosis, as well as evaluation of this  irregularity in the left posterior cul-de-sac.  There is a possibility for a  laparotomy and the patient understands this.     Todd   TDM/MEDQ  D:  07/21/2004  T:  07/21/2004  Job:  [id number]

## 2011-01-16 NOTE — Op Note (Signed)
NAMEBARB, Claire Irwin              ACCOUNT NO.:  [id number]   MEDICAL RECORD NO.:  [id number]          PATIENT TYPE:  AMB   LOCATION:  SDC                           [facility]ILITY:  [facility]   PHYSICIAN:  Zenaida Niece, M.D.DATE OF BIRTH:  1970-12-22   DATE OF PROCEDURE:  07/22/2004  DATE OF DISCHARGE:                                 OPERATIVE REPORT   PREOPERATIVE DIAGNOSES:  Pelvic pain and left posterior cul-de-sac mass.   POSTOPERATIVE DIAGNOSES:  Pelvic pain and pelvic adhesions.   PROCEDURE:  Laparoscopy with adhesiolysis.   SURGEON:  Zenaida Niece, M.D.   ANESTHESIA:  General endotracheal tube.   ESTIMATED BLOOD LOSS:  Less than 50 mL.   FINDINGS:  She had a normal appendix, liver edge and gallbladder. The middle  portion of the right tube was adherent to the anterior portion of the  uterus. There were filmy adhesions in the anterior cul-de-sac. She had  otherwise normal appearing tubes and ovaries. There were adhesions of the  sigmoid colon to the posterior uterus at the junction of the left  uterosacral ligament but no significant mass or endometriosis was seen.   DESCRIPTION OF PROCEDURE:  The patient was taken to the operating room and  placed in the dorsal supine position. General anesthesia was induced and she  was placed in mobile stirrups. The abdomen was then prepped and draped in  the usual sterile fashion for a laparoscopic procedure, bladder drained with  a red rubber catheter, hulka tenaculum applied to the cervix for uterine  manipulation.  Infraumbilical skin was then infiltrated with 0.25% Marcaine  and a 1.5 cm horizontal was made.  The Veress needle was inserted into the  peritoneal cavity and placement confirmed by the water drop test and by an  opening pressure of 2 mmHg.  CO2 gas was insufflated to a pressure of 12  mmHg and the Veress needle was removed.  The 10/11 disposable trocar was  then introduced and placement confirmed by the laparoscope. A 5  mm port was  placed low in the midline under direct visualization.  Inspection revealed  the above mentioned findings.  There were also some adhesions noted of the  small intestine to the right abdominal side wall.  The adhesions of the  right fallopian tube to the anterior uterus was taken down with sharp  dissection and the remainder of the tube looked normal. The filmy adhesions  in the anterior cul-de-sac were incised with scissors.  The adhesions in the  posterior cul-de-sac were taken down sharply and with blunt dissection. This  freed the sigmoid colon from this area of the posterior uterus and left  uterosacral ligament.  Bleeding in this area was controlled with bipolar  cautery.  I was unable to identify any significant areas that looked like  endometriosis or any significant mass.  I feel the mass that was felt on  exam was just the adhesions.  The remainder of her pelvis looked normal  without evidence of endometriosis.  The pelvis was copiously irrigated and  bleeding from the posterior portion of the uterus was controlled  with  electrocautery.  The remainder of the pelvis was hemostatic.  The 5 mm port  was then removed under direct visualization. The laparoscope was removed and  all gas allowed to deflate from the abdomen. The umbilical trocar was then  removed. The skin incisions were closed with interrupted subcuticular  sutures of 4-0 Vicryl followed by Steri-Strips and band-aids.  The patient  tolerated the  procedure well and was taken to the recovery room in stable condition.  At  the end of the procedure, the Hulka tenaculum was removed from the cervix.  Counts were correct x2 and the patient had PAS hose on throughout the  procedure.     Todd   TDM/MEDQ  D:  07/22/2004  T:  07/22/2004  Job:  [id number]

## 2011-06-24 ENCOUNTER — Other Ambulatory Visit (INDEPENDENT_AMBULATORY_CARE_PROVIDER_SITE_OTHER): Payer: BC Managed Care – PPO

## 2011-06-24 DIAGNOSIS — Z0000 Encounter for general adult medical examination without abnormal findings: Secondary | ICD-10-CM

## 2011-06-24 LAB — HEPATIC FUNCTION PANEL
AST: 23 U/L (ref 0–37)
Albumin: 3.8 g/dL (ref 3.5–5.2)
Alkaline Phosphatase: 55 U/L (ref 39–117)
Total Protein: 7.4 g/dL (ref 6.0–8.3)

## 2011-06-24 LAB — URINALYSIS, ROUTINE W REFLEX MICROSCOPIC
Bilirubin Urine: NEGATIVE
Hgb urine dipstick: NEGATIVE
Ketones, ur: NEGATIVE
Leukocytes, UA: NEGATIVE
Specific Gravity, Urine: 1.01 (ref 1.000–1.030)
Urobilinogen, UA: 0.2 (ref 0.0–1.0)

## 2011-06-24 LAB — CBC WITH DIFFERENTIAL/PLATELET
Basophils Absolute: 0 10*3/uL (ref 0.0–0.1)
Eosinophils Absolute: 0.1 10*3/uL (ref 0.0–0.7)
HCT: 41.3 % (ref 36.0–46.0)
Lymphs Abs: 2.3 10*3/uL (ref 0.7–4.0)
Monocytes Relative: 7.3 % (ref 3.0–12.0)
Platelets: 289 10*3/uL (ref 150.0–400.0)
RDW: 13.5 % (ref 11.5–14.6)

## 2011-06-24 LAB — BASIC METABOLIC PANEL
CO2: 24 mEq/L (ref 19–32)
Glucose, Bld: 101 mg/dL — ABNORMAL HIGH (ref 70–99)
Potassium: 3.9 mEq/L (ref 3.5–5.1)
Sodium: 140 mEq/L (ref 135–145)

## 2011-06-24 LAB — TSH: TSH: 1.38 u[IU]/mL (ref 0.35–5.50)

## 2011-06-29 ENCOUNTER — Ambulatory Visit (INDEPENDENT_AMBULATORY_CARE_PROVIDER_SITE_OTHER): Payer: BC Managed Care – PPO | Admitting: Internal Medicine

## 2011-06-29 ENCOUNTER — Encounter: Payer: Self-pay | Admitting: Internal Medicine

## 2011-06-29 VITALS — BP 104/72 | HR 90 | Temp 98.2°F | Ht 64.0 in | Wt 198.1 lb

## 2011-06-29 DIAGNOSIS — Z0000 Encounter for general adult medical examination without abnormal findings: Secondary | ICD-10-CM

## 2011-06-29 DIAGNOSIS — E785 Hyperlipidemia, unspecified: Secondary | ICD-10-CM

## 2011-06-29 HISTORY — DX: Hyperlipidemia, unspecified: E78.5

## 2011-06-29 NOTE — Progress Notes (Signed)
Subjective:    Patient ID: Claire Irwin, female    DOB: [birth year], 40 y.o.   MRN: [id number]  HPI  Here for wellness and f/u;  Overall doing ok;  Pt denies CP, worsening SOB, DOE, wheezing, orthopnea, PND, worsening LE edema, palpitations, dizziness or syncope.  Pt denies neurological change such as new Headache, facial or extremity weakness.  Pt denies polydipsia, polyuria, or low sugar symptoms. Pt states overall good compliance with treatment and medications, good tolerability, and trying to follow lower cholesterol diet.  Pt denies worsening depressive symptoms, suicidal ideation or panic. No fever, wt loss, night sweats, loss of appetite, or other constitutional symptoms.  Pt states good ability with ADL's, low fall risk, home safety reviewed and adequate, no significant changes in hearing or vision, and occasionally active with exercise. No acute complaints Past Medical History  Diagnosis Date  . Allergy   . GERD (gastroesophageal reflux disease)   . Asthma   . Fibrocystic breast disease   . History of endometriosis   . Preeclampsia   . DJD (degenerative joint disease) of knee     LEFT   Past Surgical History  Procedure Date  . Cesarean section 02/2004  . Dilation and curettage, diagnostic / therapeutic   . Abdominal adhesion surgery 07/2004    reports that she has never smoked. She does not have any smokeless tobacco history on file. She reports that she does not drink alcohol or use illicit drugs. family history includes Allergies in her maternal grandmother and mother; Asthma in her maternal grandmother and mother; Cancer in her paternal grandmother; Diabetes in her father and mother; Heart disease in her paternal uncle; and Hypertension in her mother. Allergies  Allergen Reactions  . Hydrocodone   . Sulfonamide Derivatives    Current Outpatient Prescriptions on File Prior to Visit  Medication Sig Dispense Refill  . albuterol (PROAIR HFA) 108 (90 BASE) MCG/ACT inhaler  Inhale 2 puffs into the lungs every 6 (six) hours as needed.        . cyclobenzaprine (FLEXERIL) 5 MG tablet Take 1 tablet (5 mg total) by mouth 3 (three) times daily as needed for muscle spasms.  60 tablet  1  . fexofenadine (ALLEGRA) 180 MG tablet Take 1 tablet (180 mg total) by mouth daily.  30 tablet  2  . fluticasone (FLONASE) 50 MCG/ACT nasal spray 2 sprays by Nasal route daily.  16 g  2  . Fluticasone-Salmeterol (ADVAIR DISKUS) 250-50 MCG/DOSE AEPB Inhale 1 puff into the lungs every 12 (twelve) hours.        . NORETHINDRONE ACETATE PO Take by mouth.         Review of Systems Review of Systems  Constitutional: Negative for diaphoresis, activity change, appetite change and unexpected weight change.  HENT: Negative for hearing loss, ear pain, facial swelling, mouth sores and neck stiffness.   Eyes: Negative for pain, redness and visual disturbance.  Respiratory: Negative for shortness of breath and wheezing.   Cardiovascular: Negative for chest pain and palpitations.  Gastrointestinal: Negative for diarrhea, blood in stool, abdominal distention and rectal pain.  Genitourinary: Negative for hematuria, flank pain and decreased urine volume.  Musculoskeletal: Negative for myalgias and joint swelling.  Skin: Negative for color change and wound.  Neurological: Negative for syncope and numbness.  Hematological: Negative for adenopathy.  Psychiatric/Behavioral: Negative for hallucinations, self-injury, decreased concentration and agitation.      Objective:   Physical Exam BP 104/72  Pulse 90  Temp(Src) 98.2 F (  36.8 C) (Oral)  Ht 5\' 4"  (1.626 m)  Wt 198 lb 2 oz (89.869 kg)  BMI 34.01 kg/m2  SpO2 97% Physical Exam  VS noted, obese Constitutional: Pt is oriented to person, place, and time. Appears well-developed and well-nourished.  HENT:  Head: Normocephalic and atraumatic.  Right Ear: External ear normal.  Left Ear: External ear normal.  Nose: Nose normal.  Mouth/Throat:  Oropharynx is clear and moist.  Eyes: Conjunctivae and EOM are normal. Pupils are equal, round, and reactive to light.  Neck: Normal range of motion. Neck supple. No JVD present. No tracheal deviation present.  Cardiovascular: Normal rate, regular rhythm, normal heart sounds and intact distal pulses.   Pulmonary/Chest: Effort normal and breath sounds normal.  Abdominal: Soft. Bowel sounds are normal. There is no tenderness.  Musculoskeletal: Normal range of motion. Exhibits no edema.  Lymphadenopathy:  Has no cervical adenopathy.  Neurological: Pt is alert and oriented to person, place, and time. Pt has normal reflexes. No cranial nerve deficit.  Skin: Skin is warm and dry. No rash noted.  Psychiatric:  Has  normal mood and affect. Behavior is normal.     Assessment & Plan:

## 2011-06-29 NOTE — Patient Instructions (Signed)
Continue all other medications as before Please have the pharmacy call if you need refills Please remember to get regular exercise, lower cholesterol diet and weight loss Please return in 1 year for your yearly visit, or sooner if needed, with Lab testing done 3-5 days before

## 2011-06-29 NOTE — Assessment & Plan Note (Signed)

## 2011-07-23 ENCOUNTER — Other Ambulatory Visit: Payer: Self-pay | Admitting: Internal Medicine

## 2012-03-07 ENCOUNTER — Other Ambulatory Visit: Payer: Self-pay | Admitting: Internal Medicine

## 2012-10-06 ENCOUNTER — Telehealth: Payer: Self-pay

## 2012-10-06 DIAGNOSIS — Z0000 Encounter for general adult medical examination without abnormal findings: Secondary | ICD-10-CM

## 2012-10-06 NOTE — Telephone Encounter (Signed)
Labs entered.

## 2012-10-27 ENCOUNTER — Encounter: Payer: Self-pay | Admitting: Internal Medicine

## 2012-10-27 ENCOUNTER — Telehealth: Payer: Self-pay | Admitting: Internal Medicine

## 2012-10-27 ENCOUNTER — Ambulatory Visit (INDEPENDENT_AMBULATORY_CARE_PROVIDER_SITE_OTHER): Payer: BC Managed Care – PPO | Admitting: Internal Medicine

## 2012-10-27 VITALS — BP 110/80 | HR 80 | Temp 98.0°F | Ht 64.5 in | Wt 198.0 lb

## 2012-10-27 DIAGNOSIS — M546 Pain in thoracic spine: Secondary | ICD-10-CM

## 2012-10-27 DIAGNOSIS — M65839 Other synovitis and tenosynovitis, unspecified forearm: Secondary | ICD-10-CM

## 2012-10-27 DIAGNOSIS — M549 Dorsalgia, unspecified: Secondary | ICD-10-CM

## 2012-10-27 DIAGNOSIS — M65849 Other synovitis and tenosynovitis, unspecified hand: Secondary | ICD-10-CM

## 2012-10-27 DIAGNOSIS — M659 Synovitis and tenosynovitis, unspecified: Secondary | ICD-10-CM

## 2012-10-27 MED ORDER — METHYLPREDNISOLONE ACETATE 80 MG/ML IJ SUSP
80.0000 mg | Freq: Once | INTRAMUSCULAR | Status: AC
  Administered 2012-10-27: 80 mg via INTRAMUSCULAR

## 2012-10-27 MED ORDER — NAPROXEN 500 MG PO TABS: 500.0000 mg | ORAL_TABLET | Freq: Two times a day (BID) | ORAL | Status: DC

## 2012-10-27 MED ORDER — CYCLOBENZAPRINE HCL 5 MG PO TABS: 5.0000 mg | ORAL_TABLET | Freq: Three times a day (TID) | ORAL | Status: DC | PRN

## 2012-10-27 NOTE — Telephone Encounter (Signed)
[facility] Triage Call Report Triage Record Num: [id number] Operator: Griselda Miner Patient Name: Claire Irwin Call Date & Time: 10/26/2012 5:18:28PM Patient Phone: PCP: Oliver Barre Patient Gender: Female PCP Fax : [phone] Patient DOB: October 05, 1970 [facility] Name: [facility] Reason for Call: Caller: Lior/Patient; PCP: Oliver Barre (Adults only); CB#: [phone]; Call regarding Knot in Neck and Tingling in Right Arm; Has a knot in neck for a few years-provider is aware. With recent stress (has lost family member) area is tender and she knows back is more tense. Tingling in right hand and fingers started on 10/24/12-does not affect function but patient is aware of it. Triaged using numbenss or tingling with a disposition to see provider within 72 hours due to any numbness or tingling that persists despite home care measures for past 24 hours. Care advice given. Appointment made with Dr. Jonny Ruiz for 10/27/12 at 10:00. Caller demonstrated her understanding. Protocol(s) Used: Arm Non-Injury Protocol(s) Used: Numbness or Tingling Recommended Outcome per Protocol: See Provider within 72 Hours Reason for Outcome: Gradual onset or worsening numbness/tingling Any numbness/tingling (not related to trauma or prolonged positioning) that persists despite home care measures for past 24 hours. Care Advice: ~ Avoid injury to affected area. Maintain good posture. Avoid putting pressure on a nerve by not carrying heavy objects such as computer case or backpack. Avoid overuse activities - alternate activities by switching sides and limit length of activity. Avoid lifting heavy objects. ~ If numbness/tingling, weakness or pain worsens, see provider in 24 hours. Call provider if numbness/tingling becomes worse when you walk, or is accompanied by other symptoms such as dizziness or muscle spasm.

## 2012-10-27 NOTE — Patient Instructions (Signed)
You had the steroid shot today Please take all new medication as prescribed - the anti-inflammatory Please continue all other medications as before, and refills have been done if requested. Please wear the right wrist splint you have at home at night only to see if this helps Thank you for enrolling in MyChart. Please follow the instructions below to securely access your online medical record. MyChart allows you to send messages to your doctor, view your test results, renew your prescriptions, schedule appointments, and more. To Log into My Chart online, please go by [employer] or [name] to [email or web address], or download the MyChart App from the [employer] .  Your Username is: [email or web address] (pass barbie) Please send a practice Message on Mychart later today.

## 2012-10-27 NOTE — Progress Notes (Signed)
Subjective:    Patient ID: Claire Irwin, female    DOB: 1970/09/09, 42 y.o.   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  Pt denies chest pain, increased sob or doe, wheezing, orthopnea, PND, increased LE swelling, palpitations, dizziness or syncope.  Pt denies new neurological symptoms such as new headache, or facial or extremity weakness or numbness except for the above.   Pt denies fever, wt loss, night sweats, loss of appetite, or other constitutional symptoms No recent traums Past Medical History  Diagnosis Date  . Allergy   . GERD (gastroesophageal reflux disease)   . Asthma   . Fibrocystic breast disease   . History of endometriosis   . Preeclampsia   . 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 Hyperlipidemia 06/29/2011   Past Surgical History  Procedure Laterality Date  . Cesarean section  02/2004  . Dilation and curettage, diagnostic / therapeutic    . Abdominal adhesion surgery  07/2004    reports that she has never smoked. She does not have any smokeless tobacco history on file. She reports that she does not drink alcohol or use illicit drugs. family history includes Allergies in her maternal grandmother and mother; Asthma in her maternal grandmother and mother; Cancer in her paternal grandmother; Diabetes in her father and mother; Heart disease in her paternal uncle; and Hypertension in her mother. Allergies  Allergen Reactions  . Hydrocodone   . Sulfonamide Derivatives    Current Outpatient Prescriptions on File Prior to Visit  Medication Sig Dispense Refill  . ADVAIR DISKUS 250-50 MCG/DOSE AEPB USE 1 PUFF TWO TIMES DAILY AS NEEDED  60 each  2  . NORETHINDRONE ACETATE PO  Take by mouth.        Marland Kitchen PROAIR HFA 108 (90 BASE) MCG/ACT inhaler USE 2 PUFFS 4 TIMES DAILY AS NEEDED  8.5 g  5  . fexofenadine (ALLEGRA) 180 MG tablet Take 1 tablet (180 mg total) by mouth daily.  30 tablet  2  . fluticasone (FLONASE) 50 MCG/ACT nasal spray 2 sprays by Nasal route daily.  16 g  2   No current facility-administered medications on file prior to visit.   Review of Systems  Constitutional: Negative for unexpected weight change, or unusual diaphoresis  HENT: Negative for tinnitus.   Eyes: Negative for photophobia and visual disturbance.  Respiratory: Negative for choking and stridor.   Gastrointestinal: Negative for vomiting and blood in stool.  Genitourinary: Negative for hematuria and decreased urine volume.  Musculoskeletal: Negative for acute joint swelling Skin: Negative for color change and wound.  Neurological: Negative for tremors and numbness other than noted  Psychiatric/Behavioral: Negative for decreased concentration or  hyperactivity.       Objective:   Physical Exam BP 110/80  Pulse 80  Temp(Src) 98 F (36.7 C) (Oral)  Ht 5' 4.5" (1.638 m)  Wt 198 lb (89.812 kg)  BMI 33.47 kg/m2  SpO2 97% VS noted,  Constitutional: Pt appears well-developed and well-nourished.  HENT: Head: NCAT.  Right Ear: External ear normal.  Left Ear: External ear normal.  Eyes: Conjunctivae and EOM are normal. Pupils are equal, round, and reactive to  light.  Neck: Normal range of motion. Neck supple.  Cardiovascular: Normal rate and regular rhythm.   Pulmonary/Chest: Effort normal and breath sounds normal.  Bilat trapezoid tender noted Spine nontender No paracervical tender, shoulders NT with FROM Neurological: Pt is alert. Not confused , motor/sens/dtr intact to UE's, neg tinel Skin: Skin is warm. No erythema. No rash but has diffuse mild puffiness/tender to RUE diffusely, cappillary refill normal, no bruising or other skin change Psychiatric: Pt behavior is normal. Thought  content normal.     Assessment & Plan:

## 2012-10-30 DIAGNOSIS — M659 Synovitis and tenosynovitis, unspecified: Secondary | ICD-10-CM | POA: Insufficient documentation

## 2012-10-30 DIAGNOSIS — M549 Dorsalgia, unspecified: Secondary | ICD-10-CM | POA: Insufficient documentation

## 2012-10-30 NOTE — Assessment & Plan Note (Signed)
C/w bilat trapezoid strain, for pain controltylenol prn

## 2012-10-30 NOTE — Assessment & Plan Note (Signed)
C/w overuse distal RUE, for depomedrol IM, and nsiad prn, avoid further overuse

## 2012-11-14 ENCOUNTER — Other Ambulatory Visit (INDEPENDENT_AMBULATORY_CARE_PROVIDER_SITE_OTHER): Payer: BC Managed Care – PPO

## 2012-11-14 DIAGNOSIS — Z0000 Encounter for general adult medical examination without abnormal findings: Secondary | ICD-10-CM

## 2012-11-14 LAB — URINALYSIS, ROUTINE W REFLEX MICROSCOPIC
Ketones, ur: NEGATIVE
Leukocytes, UA: NEGATIVE
Nitrite: NEGATIVE
Specific Gravity, Urine: 1.01 (ref 1.000–1.030)
Urobilinogen, UA: 0.2 (ref 0.0–1.0)
pH: 7.5 (ref 5.0–8.0)

## 2012-11-14 LAB — BASIC METABOLIC PANEL
BUN: 11 mg/dL (ref 6–23)
Calcium: 9 mg/dL (ref 8.4–10.5)
Creatinine, Ser: 1 mg/dL (ref 0.4–1.2)
GFR: 76.72 mL/min (ref >60.00)
Glucose, Bld: 100 mg/dL — ABNORMAL HIGH (ref 70–99)

## 2012-11-14 LAB — CBC WITH DIFFERENTIAL/PLATELET
Basophils Relative: 0.8 % (ref 0.0–3.0)
Eosinophils Absolute: 0.1 10*3/uL (ref 0.0–0.7)
Eosinophils Relative: 1.5 % (ref 0.0–5.0)
HCT: 42.7 % (ref 36.0–46.0)
Lymphs Abs: 2.3 10*3/uL (ref 0.7–4.0)
MCHC: 34 g/dL (ref 30.0–36.0)
MCV: 87.9 fl (ref 78.0–100.0)
Monocytes Absolute: 0.5 10*3/uL (ref 0.1–1.0)
Neutrophils Relative %: 57 % (ref 43.0–77.0)
RBC: 4.86 Mil/uL (ref 3.87–5.11)
WBC: 6.9 10*3/uL (ref 4.5–10.5)

## 2012-11-14 LAB — TSH: TSH: 1.91 u[IU]/mL (ref 0.35–5.50)

## 2012-11-14 LAB — LIPID PANEL
Total CHOL/HDL Ratio: 4
Triglycerides: 60 mg/dL (ref 0.0–149.0)

## 2012-11-14 LAB — HEPATIC FUNCTION PANEL
Albumin: 3.8 g/dL (ref 3.5–5.2)
Total Bilirubin: 0.6 mg/dL (ref 0.3–1.2)

## 2012-11-22 ENCOUNTER — Encounter: Payer: Self-pay | Admitting: Internal Medicine

## 2012-11-22 ENCOUNTER — Ambulatory Visit (INDEPENDENT_AMBULATORY_CARE_PROVIDER_SITE_OTHER): Payer: BC Managed Care – PPO | Admitting: Internal Medicine

## 2012-11-22 VITALS — BP 130/88 | HR 93 | Temp 98.8°F | Ht 65.0 in | Wt 199.0 lb

## 2012-11-22 DIAGNOSIS — Z23 Encounter for immunization: Secondary | ICD-10-CM

## 2012-11-22 DIAGNOSIS — K219 Gastro-esophageal reflux disease without esophagitis: Secondary | ICD-10-CM

## 2012-11-22 DIAGNOSIS — R609 Edema, unspecified: Secondary | ICD-10-CM

## 2012-11-22 DIAGNOSIS — R600 Localized edema: Secondary | ICD-10-CM | POA: Insufficient documentation

## 2012-11-22 DIAGNOSIS — Z0000 Encounter for general adult medical examination without abnormal findings: Secondary | ICD-10-CM

## 2012-11-22 MED ORDER — ALBUTEROL SULFATE HFA 108 (90 BASE) MCG/ACT IN AERS: INHALATION_SPRAY | RESPIRATORY_TRACT | Status: DC

## 2012-11-22 MED ORDER — FLUTICASONE-SALMETEROL 250-50 MCG/DOSE IN AEPB: INHALATION_SPRAY | RESPIRATORY_TRACT | Status: DC

## 2012-11-22 MED ORDER — HYDROCHLOROTHIAZIDE 12.5 MG PO TABS: 12.5000 mg | ORAL_TABLET | Freq: Every day | ORAL | Status: DC

## 2012-11-22 MED ORDER — PANTOPRAZOLE SODIUM 40 MG PO TBEC: 40.0000 mg | DELAYED_RELEASE_TABLET | Freq: Every day | ORAL | Status: DC

## 2012-11-22 NOTE — Assessment & Plan Note (Signed)

## 2012-11-22 NOTE — Assessment & Plan Note (Signed)
Mild, likely related to varicosities, for hctz 12.5 qd prn, leg elevation, low salt,  to f/u any worsening symptoms or concerns

## 2012-11-22 NOTE — Patient Instructions (Addendum)
Please take all new medication as prescribed - the fluid pill as needed, and the generic protonix for reflux You are given the prescriptionn for the compression stockings as well;  You can have this filled by [facility] on [address] Please continue all other medications as before, and refills have been done if requested. Please remember to followup with your GYN for the yearly pap smear and/or mammogram as you do You had the tetanus shot today (Tdap) Please continue your efforts at being more active, low cholesterol diet, and weight control. You are otherwise up to date with prevention measures today. Thank you for enrolling in MyChart. Please follow the instructions below to securely access your online medical record. MyChart allows you to send messages to your doctor, view your test results, renew your prescriptions, schedule appointments, and more To Log into My Chart online, please go by [name] or [name] to [name], or download the MyChart App from the [employer] .  Your Username is: [email or web address] (pass barbie) Please send a practice Message on Mychart later today. Please return in 1 year for your yearly visit, or sooner if needed, with Lab testing done 3-5 days before

## 2012-11-22 NOTE — Assessment & Plan Note (Signed)
For protonix asd,  to f/u any worsening symptoms or concerns

## 2012-11-22 NOTE — Progress Notes (Signed)
Subjective:    Patient ID: Claire Irwin, female    DOB: May 16, 1971, 42 y.o.   MRN: [id number]  HPI  Here for wellness and f/u;  Overall doing ok;  Pt denies CP, worsening SOB, DOE, wheezing, orthopnea, PND, palpitations, dizziness or syncope, but has had mild recurrent pedal edema in the last 6 mo daily.    Pt denies neurological change such as new headache, facial or extremity weakness.  Pt denies polydipsia, polyuria, or low sugar symptoms. Pt states overall good compliance with treatment and medications, good tolerability, and has been trying to follow lower cholesterol diet.  Pt denies worsening depressive symptoms, suicidal ideation or panic. No fever, night sweats, wt loss, loss of appetite, or other constitutional symptoms.  Pt states good ability with ADL's, has low fall risk, home safety reviewed and adequate, no other significant changes in hearing or vision, and only occasionally active with exercise.Has also had mild worsening reflux, but no abd pain, dysphagia, n/v, bowel change or blood. Past Medical History  Diagnosis Date  . Allergy   . GERD (gastroesophageal reflux disease)   . Asthma   . Fibrocystic breast disease   . History of endometriosis   . Preeclampsia   . 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 Hyperlipidemia 06/29/2011   Past Surgical History  Procedure Laterality Date  . Cesarean section  02/2004  . Dilation and curettage, diagnostic / therapeutic    . Abdominal adhesion surgery  07/2004    reports that she has never smoked. She does not have any smokeless tobacco history on file. She reports that she does not drink alcohol or use illicit drugs. family history includes Allergies in her maternal grandmother and mother; Asthma in her maternal grandmother and mother; Cancer in her paternal grandmother; Diabetes in her father and mother; Heart disease in her paternal uncle; and Hypertension in her mother. Allergies  Allergen Reactions  .  Hydrocodone   . Sulfonamide Derivatives    Current Outpatient Prescriptions on File Prior to Visit  Medication Sig Dispense Refill  . cyclobenzaprine (FLEXERIL) 5 MG tablet Take 1 tablet (5 mg total) by mouth 3 (three) times daily as needed for muscle spasms.  60 tablet  1  . naproxen (NAPROSYN) 500 MG tablet Take 1 tablet (500 mg total) by mouth 2 (two) times daily with a meal.  60 tablet  2  . NORETHINDRONE ACETATE PO Take by mouth.        . fexofenadine (ALLEGRA) 180 MG tablet Take 1 tablet (180 mg total) by mouth daily.  30 tablet  2  . fluticasone (FLONASE) 50 MCG/ACT nasal spray 2 sprays by Nasal route daily.  16 g  2   No current facility-administered medications on file prior to visit.   Review of Systems Constitutional: Negative for diaphoresis, activity change, appetite change or unexpected weight change.  HENT: Negative for hearing loss, ear pain, facial swelling, mouth sores and neck stiffness.   Eyes: Negative for pain, redness and visual disturbance.  Respiratory: Negative for shortness of breath and wheezing.   Cardiovascular: Negative for chest pain and palpitations.  Gastrointestinal: Negative for diarrhea, blood in stool, abdominal distention or other pain Genitourinary: Negative for hematuria, flank pain or change in urine volume.  Musculoskeletal: Negative for myalgias and joint swelling.  Skin: Negative for color change and wound.  Neurological: Negative for syncope and numbness. other than noted Hematological: Negative for adenopathy.  Psychiatric/Behavioral: Negative for hallucinations, self-injury, decreased concentration  and agitation.      Objective:   Physical Exam BP 130/88  Pulse 93  Temp(Src) 98.8 F (37.1 C) (Oral)  Ht 5\' 5"  (1.651 m)  Wt 199 lb (90.266 kg)  BMI 33.12 kg/m2  SpO2 97% VS noted,  Constitutional: Pt is oriented to person, place, and time. Appears well-developed and well-nourished.  Head: Normocephalic and atraumatic.  Right Ear:  External ear normal.  Left Ear: External ear normal.  Nose: Nose normal.  Mouth/Throat: Oropharynx is clear and moist.  Eyes: Conjunctivae and EOM are normal. Pupils are equal, round, and reactive to light.  Neck: Normal range of motion. Neck supple. No JVD present. No tracheal deviation present.  Cardiovascular: Normal rate, regular rhythm, normal heart sounds and intact distal pulses.   Pulmonary/Chest: Effort normal and breath sounds normal.  Abdominal: Soft. Bowel sounds are normal. There is no tenderness. No HSM  Musculoskeletal: Normal range of motion. Exhibits no edema.  Lymphadenopathy:  Has no cervical adenopathy.  Neurological: Pt is alert and oriented to person, place, and time. Pt has normal reflexes. No cranial nerve deficit.  Skin: Skin is warm and dry. No rash noted. trace pedal edema bilat, mult varicosities noted to bilat legs Psychiatric:  Has  normal mood and affect. Behavior is normal.     Assessment & Plan:

## 2013-03-23 ENCOUNTER — Telehealth: Payer: Self-pay | Admitting: *Deleted

## 2013-03-23 NOTE — Telephone Encounter (Signed)
Pt called states when she attempted to refill her Pantoprazole, the pharmacy states the insurance would not authorize refill until Sept. 14.  Pt purchased OTC as replacement.  please advise.

## 2013-03-25 NOTE — Telephone Encounter (Signed)
This sounds like the best option, though if could try change to lasoprazole 30 qd if covered by insurance now

## 2013-03-27 MED ORDER — LANSOPRAZOLE 30 MG PO CPDR: 30.0000 mg | DELAYED_RELEASE_CAPSULE | Freq: Every day | ORAL | Status: DC

## 2013-03-27 NOTE — Telephone Encounter (Signed)
Ok to change

## 2013-03-27 NOTE — Telephone Encounter (Signed)
Done erx 

## 2013-03-27 NOTE — Addendum Note (Signed)
Addended by: Corwin Levins on: 03/27/2013 10:11 AM   Modules accepted: Orders

## 2013-03-31 LAB — HM MAMMOGRAPHY

## 2013-04-05 ENCOUNTER — Ambulatory Visit (INDEPENDENT_AMBULATORY_CARE_PROVIDER_SITE_OTHER): Payer: BC Managed Care – PPO | Admitting: Internal Medicine

## 2013-04-05 ENCOUNTER — Ambulatory Visit: Payer: BC Managed Care – PPO | Admitting: Internal Medicine

## 2013-04-05 ENCOUNTER — Ambulatory Visit (INDEPENDENT_AMBULATORY_CARE_PROVIDER_SITE_OTHER)
Admission: RE | Admit: 2013-04-05 | Discharge: 2013-04-05 | Disposition: A | Discharge status: Home / Self Care | Payer: BC Managed Care – PPO | Source: Ambulatory Visit | Attending: Internal Medicine | Admitting: Internal Medicine

## 2013-04-05 ENCOUNTER — Telehealth: Payer: Self-pay | Admitting: *Deleted

## 2013-04-05 ENCOUNTER — Encounter: Payer: Self-pay | Admitting: Internal Medicine

## 2013-04-05 VITALS — BP 102/72 | HR 84 | Temp 98.0°F | Ht 64.0 in | Wt 199.0 lb

## 2013-04-05 DIAGNOSIS — R609 Edema, unspecified: Secondary | ICD-10-CM

## 2013-04-05 DIAGNOSIS — J45909 Unspecified asthma, uncomplicated: Secondary | ICD-10-CM

## 2013-04-05 MED ORDER — HYDROCHLOROTHIAZIDE 25 MG PO TABS: 25.0000 mg | ORAL_TABLET | Freq: Every day | ORAL | Status: DC

## 2013-04-05 NOTE — Telephone Encounter (Signed)
Prior auth initiated & approved 7.16.14 through 8.6.15. Pharmacy made aware.

## 2013-04-05 NOTE — Progress Notes (Signed)
Subjective:    Patient ID: Claire Irwin, female    DOB: 08/24/1971, 42 y.o.   MRN: [id number]  HPI   Here to f/u; overall doing ok, and Pt denies chest pain, increased sob or doe, wheezing, orthopnea, PND, palpitations, dizziness or syncope.  Pt denies polydipsia, polyuria, or low sugar symptoms such as weakness or confusion improved with po intake.  Pt denies new neurological symptoms such as new headache, or facial or extremity weakness or numbness.   Pt states overall good compliance with meds, has been trying to follow lower cholesterol diet, with wt overall stable,  but little exercise however.  C/o 1 mo increased feet and ankle swelling, has trace in march, now worse.  Not sure about incrased fluids or salt intake, has been trying to elevate lgs occaisonally, has not yet started the compression stockings. Taking HCT every day, and taking the naproxen as needed only.   Past Medical History  Diagnosis Date  . Allergy   . GERD (gastroesophageal reflux disease)   . Asthma   . Fibrocystic breast disease   . History of endometriosis   . Preeclampsia   . 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 Hyperlipidemia 06/29/2011   Past Surgical History  Procedure Laterality Date  . Cesarean section  02/2004  . Dilation and curettage, diagnostic / therapeutic    . Abdominal adhesion surgery  07/2004    reports that she has never smoked. She does not have any smokeless tobacco history on file. She reports that she does not drink alcohol or use illicit drugs. family history includes Allergies in her maternal grandmother and mother; Asthma in her maternal grandmother and mother; Cancer in her paternal grandmother; Diabetes in her father and mother; Heart disease in her paternal uncle; and Hypertension in her mother. Allergies  Allergen Reactions  . Hydrocodone   . Sulfonamide Derivatives    Current Outpatient Prescriptions on File Prior to Visit  Medication Sig Dispense Refill  .  albuterol (PROAIR HFA) 108 (90 BASE) MCG/ACT inhaler USE 2 PUFFS 4 TIMES DAILY AS NEEDED  8.5 g  5  . cyclobenzaprine (FLEXERIL) 5 MG tablet Take 1 tablet (5 mg total) by mouth 3 (three) times daily as needed for muscle spasms.  60 tablet  1  . Fluticasone-Salmeterol (ADVAIR DISKUS) 250-50 MCG/DOSE AEPB USE 1 PUFF TWO TIMES DAILY AS NEEDED  60 each  5  . lansoprazole (PREVACID) 30 MG capsule Take 1 capsule (30 mg total) by mouth daily.  90 capsule  3  . naproxen (NAPROSYN) 500 MG tablet Take 1 tablet (500 mg total) by mouth 2 (two) times daily with a meal.  60 tablet  2  . NORETHINDRONE ACETATE PO Take by mouth.        . pantoprazole (PROTONIX) 40 MG tablet Take 1 tablet (40 mg total) by mouth daily.  90 tablet  3  . fexofenadine (ALLEGRA) 180 MG tablet Take 1 tablet (180 mg total) by mouth daily.  30 tablet  2  . fluticasone (FLONASE) 50 MCG/ACT nasal spray 2 sprays by Nasal route daily.  16 g  2   No current facility-administered medications on file prior to visit.   Review of Systems  Constitutional: Negative for unexpected weight change, or unusual diaphoresis  HENT: Negative for tinnitus.   Eyes: Negative for photophobia and visual disturbance.  Respiratory: Negative for choking and stridor.   Gastrointestinal: Negative for vomiting and blood in stool.  Genitourinary: Negative  for hematuria and decreased urine volume.  Musculoskeletal: Negative for acute joint swelling Skin: Negative for color change and wound.  Neurological: Negative for tremors and numbness other than noted  Psychiatric/Behavioral: Negative for decreased concentration or  hyperactivity.       Objective:   Physical Exam BP 102/72  Pulse 84  Temp(Src) 98 F (36.7 C) (Oral)  Ht 5\' 4"  (1.626 m)  Wt 199 lb (90.266 kg)  BMI 34.14 kg/m2  SpO2 97% VS noted,  Constitutional: Pt appears well-developed and well-nourished.  HENT: Head: NCAT.  Right Ear: External ear normal.  Left Ear: External ear normal.  Eyes:  Conjunctivae and EOM are normal. Pupils are equal, round, and reactive to light.  Neck: Normal range of motion. Neck supple.  Cardiovascular: Normal rate and regular rhythm.   Pulmonary/Chest: Effort normal and breath sounds normal.  Abd:  Soft, NT, non-distended, + BS Neurological: Pt is alert. Not confused  Skin: Skin is warm. No erythema. trace to 1+ ankle edema bilat Psychiatric: Pt behavior is normal. Thought content normal.      Assessment & Plan:

## 2013-04-05 NOTE — Patient Instructions (Signed)
Ok to increase the HCT fluid pill to 25 mg per day Please continue all other medications as before, and refills have been done if requested. Please have the pharmacy call with any other refills you may need. You will be contacted regarding the referral for: echocardiogram Please go to the XRAY Department in the Basement (go straight as you get off the elevator) for the x-ray testing Please follow lower salt diet, lose wt, and try to use the compression stockings during daytime only  Please remember to sign up for My Chart if you have not done so, as this will be important to you in the future with finding out test results, communicating by private email, and scheduling acute appointments online when needed.

## 2013-04-05 NOTE — Assessment & Plan Note (Signed)
stable overall by history and exam, recent data reviewed with pt, and pt to continue medical treatment as before,  to f/u any worsening symptoms or concerns SpO2 Readings from Last 3 Encounters:  04/05/13 97%  11/22/12 97%  10/27/12 97%

## 2013-04-05 NOTE — Assessment & Plan Note (Signed)
?   Worsening venous insuff, in the setting of dietary salt/fluids vs other; I reveiewd mar ecg, labs with pt; since worse now will ask for cxr, and echo, also for incr HCT to 25 mg qd

## 2013-04-10 ENCOUNTER — Ambulatory Visit (HOSPITAL_COMMUNITY): Payer: BC Managed Care – PPO | Attending: Internal Medicine

## 2013-04-10 DIAGNOSIS — J45909 Unspecified asthma, uncomplicated: Secondary | ICD-10-CM | POA: Insufficient documentation

## 2013-04-10 DIAGNOSIS — R609 Edema, unspecified: Secondary | ICD-10-CM | POA: Insufficient documentation

## 2013-04-10 DIAGNOSIS — E785 Hyperlipidemia, unspecified: Secondary | ICD-10-CM | POA: Insufficient documentation

## 2013-04-10 NOTE — Progress Notes (Signed)
Echocardiogram performed.  

## 2013-07-06 ENCOUNTER — Other Ambulatory Visit: Payer: Self-pay

## 2013-11-28 ENCOUNTER — Telehealth: Payer: Self-pay

## 2013-11-28 NOTE — Telephone Encounter (Signed)
Called left message to call back 

## 2013-11-28 NOTE — Telephone Encounter (Signed)
The patient called wanting to discuss her leg swelling and compression hose options with the CMA.  She is hoping to try a full length version of a compression hose if possible.

## 2013-11-29 NOTE — Telephone Encounter (Signed)
Called left message to call back 

## 2013-11-29 NOTE — Telephone Encounter (Signed)
Called the patient left a detailed message of MD instructions.

## 2013-11-29 NOTE — Telephone Encounter (Signed)
Please consider OV 

## 2013-11-29 NOTE — Telephone Encounter (Signed)
Called the patient left a detailed message to schedule appointment to address issues.  Patient had stated ok to leave a message on cell [phone].

## 2013-11-29 NOTE — Telephone Encounter (Signed)
Feet  Are still swelling, right foot worse than left.  She has done the knee highs but do not work well because of knot on leg.  Would PCP recommend hose that go all the way up.  Also fluid pill should she do 2 per day or do every day.  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  Swelling has increased over the last couple of weeks.  Call back number [phone].

## 2013-11-30 NOTE — Telephone Encounter (Signed)
Patient called to confirm she did get my message, scheduled appointment 12/05/13 at 6:15 PM.

## 2013-12-05 ENCOUNTER — Encounter: Payer: Self-pay | Admitting: Internal Medicine

## 2013-12-05 ENCOUNTER — Ambulatory Visit (INDEPENDENT_AMBULATORY_CARE_PROVIDER_SITE_OTHER): Payer: BC Managed Care – PPO | Admitting: Internal Medicine

## 2013-12-05 VITALS — BP 114/70 | HR 96 | Temp 99.2°F | Ht 64.0 in | Wt 204.0 lb

## 2013-12-05 DIAGNOSIS — R609 Edema, unspecified: Secondary | ICD-10-CM

## 2013-12-05 DIAGNOSIS — I83893 Varicose veins of bilateral lower extremities with other complications: Secondary | ICD-10-CM | POA: Insufficient documentation

## 2013-12-05 DIAGNOSIS — I5189 Other ill-defined heart diseases: Secondary | ICD-10-CM

## 2013-12-05 DIAGNOSIS — I519 Heart disease, unspecified: Secondary | ICD-10-CM

## 2013-12-05 MED ORDER — FUROSEMIDE 20 MG PO TABS: 20.0000 mg | ORAL_TABLET | Freq: Every day | ORAL | Status: DC

## 2013-12-05 NOTE — Assessment & Plan Note (Signed)
For referral vein clinic due to pain

## 2013-12-05 NOTE — Assessment & Plan Note (Signed)
Consider increase lasix to 40 mg if not improved with 20

## 2013-12-05 NOTE — Progress Notes (Signed)
Pre visit review using our clinic review tool, if applicable. No additional management support is needed unless otherwise documented below in the visit note. 

## 2013-12-05 NOTE — Progress Notes (Signed)
Subjective:    Patient ID: Claire Irwin, female    DOB: [birth year], 43 y.o.   MRN: [id number]  HPI  Here to f/u with worsening LE swelling to legs in past 2 wks without change in diet, position, medicaion compliance or other known issue such as renal insuff.  Has some tender painful varicosities and swelling that somewhat reduced in the AM, worse at night.  Aso has known gr 2 diastolic dysfxn on echo 1443.  Pt denies chest pain, increased sob or doe, wheezing, orthopnea, PND, palpitations, dizziness or syncope.  Pt denies polydipsia, polyuria, o Pt states overall good compliance with meds.  Did not tolerate the knee high compression stockings as seemed to "cut into me" at the edge below the knee, so will not wear. Past Medical History  Diagnosis Date  . Allergy   . GERD (gastroesophageal reflux disease)   . Asthma   . Fibrocystic breast disease   . History of endometriosis   . Preeclampsia   . 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 Hyperlipidemia 06/29/2011   Past Surgical History  Procedure Laterality Date  . Cesarean section  02/2004  . Dilation and curettage, diagnostic / therapeutic    . Abdominal adhesion surgery  07/2004    reports that she has never smoked. She does not have any smokeless tobacco history on file. She reports that she does not drink alcohol or use illicit drugs. family history includes Allergies in her maternal grandmother and mother; Asthma in her maternal grandmother and mother; Cancer in her paternal grandmother; Diabetes in her father and mother; Heart disease in her paternal uncle; Hypertension in her mother. Allergies  Allergen Reactions  . Hydrocodone   . Sulfonamide Derivatives    Current Outpatient Prescriptions on File Prior to Visit  Medication Sig Dispense Refill  . albuterol (PROAIR HFA) 108 (90 BASE) MCG/ACT inhaler USE 2 PUFFS 4 TIMES DAILY AS NEEDED  8.5 g  5  . cyclobenzaprine (FLEXERIL) 5 MG tablet Take 1 tablet (5 mg  total) by mouth 3 (three) times daily as needed for muscle spasms.  60 tablet  1  . Fluticasone-Salmeterol (ADVAIR DISKUS) 250-50 MCG/DOSE AEPB USE 1 PUFF TWO TIMES DAILY AS NEEDED  60 each  5  . lansoprazole (PREVACID) 30 MG capsule Take 1 capsule (30 mg total) by mouth daily.  90 capsule  3  . naproxen (NAPROSYN) 500 MG tablet Take 1 tablet (500 mg total) by mouth 2 (two) times daily with a meal.  60 tablet  2  . NORETHINDRONE ACETATE PO Take by mouth.        . pantoprazole (PROTONIX) 40 MG tablet Take 1 tablet (40 mg total) by mouth daily.  90 tablet  3  . fexofenadine (ALLEGRA) 180 MG tablet Take 1 tablet (180 mg total) by mouth daily.  30 tablet  2  . fluticasone (FLONASE) 50 MCG/ACT nasal spray 2 sprays by Nasal route daily.  16 g  2   No current facility-administered medications on file prior to visit.   Review of Systems  Constitutional: Negative for unexpected weight change, or unusual diaphoresis  HENT: Negative for tinnitus.   Eyes: Negative for photophobia and visual disturbance.  Respiratory: Negative for choking and stridor.   Gastrointestinal: Negative for vomiting and blood in stool.  Genitourinary: Negative for hematuria and decreased urine volume.  Musculoskeletal: Negative for acute joint swelling Skin: Negative for color change and wound.  Neurological: Negative for tremors  and numbness other than noted  Psychiatric/Behavioral: Negative for decreased concentration or  hyperactivity.       Objective:   Physical Exam BP 114/70  Pulse 96  Temp(Src) 99.2 F (37.3 C) (Oral)  Ht 5\' 4"  (1.626 m)  Wt 204 lb (92.534 kg)  BMI 35.00 kg/m2  SpO2 99% VS noted,  Constitutional: Pt appears well-developed and well-nourished.  HENT: Head: NCAT.  Right Ear: External ear normal.  Left Ear: External ear normal.  Eyes: Conjunctivae and EOM are normal. Pupils are equal, round, and reactive to light.  Neck: Normal range of motion. Neck supple.  Cardiovascular: Normal rate and  regular rhythm.   Pulmonary/Chest: Effort normal and breath sounds normal.  Neurological: Pt is alert. Not confused  Skin: Skin is warm. No erythema. trae to 1+ edema to knee bilat, several tender but not thrombosed varicosities noted bilat LE's Psychiatric: Pt behavior is normal. Thought content normal.     Assessment & Plan:

## 2013-12-05 NOTE — Assessment & Plan Note (Addendum)
Likely combination of venous insuff +/- diast dysfxn, for change hct to lasix 20 qd, consider 40qd if not improved, for leg elevation, low salt diet, and thigh high compression stockings; then f/u next planned visit with lab apr 29

## 2013-12-05 NOTE — Patient Instructions (Addendum)
Ok to stop the HCT fluid pill (hydrochlorothiazide)  Please take all new medication as prescribed  - the lasix 20 mg per day (furosemide)  You are given the prescription and information about getting the thigh high compression stockings  You will be contacted regarding the referral for: vein clinic  Please continue all other medications as before, and refills have been done if requested. Please have the pharmacy call with any other refills you may need.

## 2013-12-06 ENCOUNTER — Other Ambulatory Visit (INDEPENDENT_AMBULATORY_CARE_PROVIDER_SITE_OTHER): Payer: BC Managed Care – PPO

## 2013-12-06 DIAGNOSIS — Z0000 Encounter for general adult medical examination without abnormal findings: Secondary | ICD-10-CM

## 2013-12-06 LAB — CBC WITH DIFFERENTIAL/PLATELET
Basophils Absolute: 0 10*3/uL (ref 0.0–0.1)
Basophils Relative: 0.5 % (ref 0.0–3.0)
EOS PCT: 2.3 % (ref 0.0–5.0)
Eosinophils Absolute: 0.2 10*3/uL (ref 0.0–0.7)
HEMATOCRIT: 41.4 % (ref 36.0–46.0)
HEMOGLOBIN: 14.1 g/dL (ref 12.0–15.0)
LYMPHS ABS: 2.6 10*3/uL (ref 0.7–4.0)
Lymphocytes Relative: 39.4 % (ref 12.0–46.0)
MCHC: 34.1 g/dL (ref 30.0–36.0)
MCV: 87.5 fl (ref 78.0–100.0)
MONOS PCT: 8.1 % (ref 3.0–12.0)
Monocytes Absolute: 0.5 10*3/uL (ref 0.1–1.0)
NEUTROS ABS: 3.2 10*3/uL (ref 1.4–7.7)
Neutrophils Relative %: 49.7 % (ref 43.0–77.0)
Platelets: 305 10*3/uL (ref 150.0–400.0)
RBC: 4.73 Mil/uL (ref 3.87–5.11)
RDW: 13.3 % (ref 11.5–14.6)
WBC: 6.5 10*3/uL (ref 4.5–10.5)

## 2013-12-06 LAB — BASIC METABOLIC PANEL
BUN: 7 mg/dL (ref 6–23)
CALCIUM: 9.2 mg/dL (ref 8.4–10.5)
CHLORIDE: 107 meq/L (ref 96–112)
CO2: 27 mEq/L (ref 19–32)
CREATININE: 1 mg/dL (ref 0.4–1.2)
GFR: 79 mL/min (ref >60.00)
Glucose, Bld: 98 mg/dL (ref 70–99)
Potassium: 4 mEq/L (ref 3.5–5.1)
Sodium: 139 mEq/L (ref 135–145)

## 2013-12-06 LAB — HEPATIC FUNCTION PANEL
ALBUMIN: 3.5 g/dL (ref 3.5–5.2)
ALT: 24 U/L (ref 0–35)
AST: 21 U/L (ref 0–37)
Alkaline Phosphatase: 68 U/L (ref 39–117)
BILIRUBIN TOTAL: 0.8 mg/dL (ref 0.3–1.2)
Bilirubin, Direct: 0.1 mg/dL (ref 0.0–0.3)
Total Protein: 7.1 g/dL (ref 6.0–8.3)

## 2013-12-06 LAB — LIPID PANEL
CHOLESTEROL: 172 mg/dL (ref 0–200)
HDL: 44.1 mg/dL (ref >39.00)
LDL Cholesterol: 116 mg/dL — ABNORMAL HIGH (ref 0–99)
TRIGLYCERIDES: 60 mg/dL (ref 0.0–149.0)
Total CHOL/HDL Ratio: 4
VLDL: 12 mg/dL (ref 0.0–40.0)

## 2013-12-06 LAB — URINALYSIS, ROUTINE W REFLEX MICROSCOPIC
BILIRUBIN URINE: NEGATIVE
HGB URINE DIPSTICK: NEGATIVE
Ketones, ur: NEGATIVE
Leukocytes, UA: NEGATIVE
Nitrite: NEGATIVE
RBC / HPF: NONE SEEN (ref 0–?)
Specific Gravity, Urine: 1.01 (ref 1.000–1.030)
TOTAL PROTEIN, URINE-UPE24: NEGATIVE
URINE GLUCOSE: NEGATIVE
Urobilinogen, UA: 0.2 (ref 0.0–1.0)
WBC, UA: NONE SEEN (ref 0–?)
pH: 6.5 (ref 5.0–8.0)

## 2013-12-06 LAB — TSH: TSH: 1.03 u[IU]/mL (ref 0.35–5.50)

## 2013-12-25 ENCOUNTER — Other Ambulatory Visit: Payer: Self-pay | Admitting: *Deleted

## 2013-12-25 DIAGNOSIS — I83893 Varicose veins of bilateral lower extremities with other complications: Secondary | ICD-10-CM

## 2013-12-27 ENCOUNTER — Ambulatory Visit (INDEPENDENT_AMBULATORY_CARE_PROVIDER_SITE_OTHER): Payer: BC Managed Care – PPO | Admitting: Internal Medicine

## 2013-12-27 ENCOUNTER — Encounter: Payer: Self-pay | Admitting: Internal Medicine

## 2013-12-27 VITALS — BP 110/72 | HR 85 | Temp 99.1°F | Ht 64.0 in | Wt 203.0 lb

## 2013-12-27 DIAGNOSIS — I5189 Other ill-defined heart diseases: Secondary | ICD-10-CM

## 2013-12-27 DIAGNOSIS — E785 Hyperlipidemia, unspecified: Secondary | ICD-10-CM

## 2013-12-27 DIAGNOSIS — I519 Heart disease, unspecified: Secondary | ICD-10-CM

## 2013-12-27 DIAGNOSIS — Z0000 Encounter for general adult medical examination without abnormal findings: Secondary | ICD-10-CM

## 2013-12-27 MED ORDER — OMEPRAZOLE 20 MG PO CPDR: 20.0000 mg | DELAYED_RELEASE_CAPSULE | Freq: Two times a day (BID) | ORAL | Status: DC

## 2013-12-27 MED ORDER — HYDROCHLOROTHIAZIDE 25 MG PO TABS: 25.0000 mg | ORAL_TABLET | Freq: Every day | ORAL | Status: DC

## 2013-12-27 MED ORDER — POTASSIUM CHLORIDE ER 10 MEQ PO TBCR: 10.0000 meq | EXTENDED_RELEASE_TABLET | Freq: Every day | ORAL | Status: DC

## 2013-12-27 NOTE — Progress Notes (Signed)
Pre visit review using our clinic review tool, if applicable. No additional management support is needed unless otherwise documented below in the visit note. 

## 2013-12-27 NOTE — Assessment & Plan Note (Signed)

## 2013-12-27 NOTE — Patient Instructions (Addendum)
Ok to stop the lasix fluid pill  Please take all new medication as prescribed - the HCT fluid pill, and the potassium pill to go with it, and the prilosec prescription  Please continue all other medications as before, and refills have been done if requested. Please have the pharmacy call with any other refills you may need.  Please continue your efforts at being more active, low cholesterol diet, and weight control.  You are otherwise up to date with prevention measures today.  Your lab work was OK from last month  Your EKG was OK today  Please remember to sign up for MyChart if you have not done so, as this will be important to you in the future with finding out test results, communicating by private email, and scheduling acute appointments online when needed.  Please return in 1 year for your yearly visit, or sooner if needed, with Lab testing done 3-5 days before

## 2013-12-27 NOTE — Progress Notes (Signed)
Subjective:    Patient ID: Claire Irwin, female    DOB: October 06, 1970, 43 y.o.   MRN: [id number]  HPI  Here for wellness and f/u;  Overall doing ok;  Pt denies CP, worsening SOB, DOE, wheezing, orthopnea, PND, worsening LE edema, palpitations, dizziness or syncope.  Pt denies neurological change such as new headache, facial or extremity weakness. Does have occas leg cramps and lasix not really working well. Legs get tighter with exercise, very uncomfortable.  Pt denies polydipsia, polyuria, or low sugar symptoms. Pt states overall good compliance with treatment and medications, good tolerability, and has been trying to follow lower cholesterol diet.  Pt denies worsening depressive symptoms, suicidal ideation or panic. No fever, night sweats, wt loss, loss of appetite, or other constitutional symptoms.  Pt states good ability with ADL's, has low fall risk, home safety reviewed and adequate, no other significant changes in hearing or vision, and only occasionally active with exercise. No other compalints.  Prevacid and protonix not covered by insuarnace  Past Medical History  Diagnosis Date  . Allergy   . GERD (gastroesophageal reflux disease)   . Asthma   . Fibrocystic breast disease   . History of endometriosis   . Preeclampsia   . 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 Hyperlipidemia 06/29/2011   Past Surgical History  Procedure Laterality Date  . Cesarean section  02/2004  . Dilation and curettage, diagnostic / therapeutic    . Abdominal adhesion surgery  07/2004    reports that she has never smoked. She does not have any smokeless tobacco history on file. She reports that she does not drink alcohol or use illicit drugs. family history includes Allergies in her maternal grandmother and mother; Asthma in her maternal grandmother and mother; Cancer in her paternal grandmother; Diabetes in her father and mother; Heart disease in her paternal uncle; Hypertension in her  mother. Allergies  Allergen Reactions  . Hydrocodone   . Sulfonamide Derivatives    Current Outpatient Prescriptions on File Prior to Visit  Medication Sig Dispense Refill  . albuterol (PROAIR HFA) 108 (90 BASE) MCG/ACT inhaler USE 2 PUFFS 4 TIMES DAILY AS NEEDED  8.5 g  5  . cyclobenzaprine (FLEXERIL) 5 MG tablet Take 1 tablet (5 mg total) by mouth 3 (three) times daily as needed for muscle spasms.  60 tablet  1  . Fluticasone-Salmeterol (ADVAIR DISKUS) 250-50 MCG/DOSE AEPB USE 1 PUFF TWO TIMES DAILY AS NEEDED  60 each  5  . furosemide (LASIX) 20 MG tablet Take 1 tablet (20 mg total) by mouth daily.  90 tablet  3  . lansoprazole (PREVACID) 30 MG capsule Take 1 capsule (30 mg total) by mouth daily.  90 capsule  3  . NORETHINDRONE ACETATE PO Take by mouth.        . fexofenadine (ALLEGRA) 180 MG tablet Take 1 tablet (180 mg total) by mouth daily.  30 tablet  2  . fluticasone (FLONASE) 50 MCG/ACT nasal spray 2 sprays by Nasal route daily.  16 g  2   No current facility-administered medications on file prior to visit.    Review of Systems Constitutional: Negative for increased diaphoresis, other activity, appetite or other siginficant weight change  HENT: Negative for worsening hearing loss, ear pain, facial swelling, mouth sores and neck stiffness.   Eyes: Negative for other worsening pain, redness or visual disturbance.  Respiratory: Negative for shortness of breath and wheezing.   Cardiovascular: Negative  for chest pain and palpitations.  Gastrointestinal: Negative for diarrhea, blood in stool, abdominal distention or other pain Genitourinary: Negative for hematuria, flank pain or change in urine volume.  Musculoskeletal: Negative for myalgias or other joint complaints.  Skin: Negative for color change and wound.  Neurological: Negative for syncope and numbness. other than noted Hematological: Negative for adenopathy. or other swelling Psychiatric/Behavioral: Negative for  hallucinations, self-injury, decreased concentration or other worsening agitation.      Objective:   Physical Exam BP 110/72  Pulse 85  Temp(Src) 99.1 F (37.3 C) (Oral)  Ht 5\' 4"  (1.626 m)  Wt 203 lb (92.08 kg)  BMI 34.83 kg/m2  SpO2 97% VS noted,  Constitutional: Pt is oriented to person, place, and time. Appears well-developed and well-nourished.  Head: Normocephalic and atraumatic.  Right Ear: External ear normal.  Left Ear: External ear normal.  Nose: Nose normal.  Mouth/Throat: Oropharynx is clear and moist.  Eyes: Conjunctivae and EOM are normal. Pupils are equal, round, and reactive to light.  Neck: Normal range of motion. Neck supple. No JVD present. No tracheal deviation present.  Cardiovascular: Normal rate, regular rhythm, normal heart sounds and intact distal pulses.   Pulmonary/Chest: Effort normal and breath sounds without rales or wheezing  Abdominal: Soft. Bowel sounds are normal. NT. No HSM  Musculoskeletal: Normal range of motion. Exhibits no edema.  Lymphadenopathy:  Has no cervical adenopathy.  Neurological: Pt is alert and oriented to person, place, and time. Pt has normal reflexes. No cranial nerve deficit. Motor grossly intact Skin: Skin is warm and dry. No rash noted. 1_+ edema left > right to knees Psychiatric:  Has normal mood and affect. Behavior is normal.     Assessment & Plan:

## 2013-12-27 NOTE — Assessment & Plan Note (Signed)
To cont diet lower cholesterol

## 2013-12-27 NOTE — Assessment & Plan Note (Signed)
Ok to change lasix to hct 25 qd, with klor con 10 qd

## 2014-01-24 ENCOUNTER — Encounter: Payer: BC Managed Care – PPO | Admitting: Vascular Surgery

## 2014-01-24 ENCOUNTER — Encounter (HOSPITAL_COMMUNITY): Payer: BC Managed Care – PPO

## 2014-01-24 ENCOUNTER — Other Ambulatory Visit: Payer: Self-pay | Admitting: Internal Medicine

## 2014-02-21 ENCOUNTER — Encounter: Payer: BC Managed Care – PPO | Admitting: Vascular Surgery

## 2014-02-21 ENCOUNTER — Encounter (HOSPITAL_COMMUNITY): Payer: BC Managed Care – PPO

## 2014-03-14 ENCOUNTER — Encounter: Payer: BC Managed Care – PPO | Admitting: Vascular Surgery

## 2014-03-14 ENCOUNTER — Encounter (HOSPITAL_COMMUNITY): Payer: BC Managed Care – PPO

## 2014-07-09 ENCOUNTER — Other Ambulatory Visit: Payer: Self-pay | Admitting: Internal Medicine

## 2014-12-17 ENCOUNTER — Other Ambulatory Visit: Payer: BC Managed Care – PPO

## 2015-01-02 ENCOUNTER — Encounter: Payer: Self-pay | Admitting: Internal Medicine

## 2015-01-02 ENCOUNTER — Ambulatory Visit (INDEPENDENT_AMBULATORY_CARE_PROVIDER_SITE_OTHER): Payer: BC Managed Care – PPO | Admitting: Internal Medicine

## 2015-01-02 VITALS — BP 118/82 | HR 102 | Temp 98.0°F | Resp 18 | Ht 64.0 in | Wt 199.0 lb

## 2015-01-02 DIAGNOSIS — F411 Generalized anxiety disorder: Secondary | ICD-10-CM

## 2015-01-02 DIAGNOSIS — Z0000 Encounter for general adult medical examination without abnormal findings: Secondary | ICD-10-CM | POA: Diagnosis not present

## 2015-01-02 MED ORDER — ESCITALOPRAM OXALATE 10 MG PO TABS: 10.0000 mg | ORAL_TABLET | Freq: Every day | ORAL | Status: DC

## 2015-01-02 NOTE — Patient Instructions (Signed)
Please take all new medication as prescribed - the lexapro at 10 mg per day (and remember this takes up to 3-4 weeks for the full effect)  Please continue all other medications as before, and refills have been done if requested.  Please have the pharmacy call with any other refills you may need.  Please continue your efforts at being more active, low cholesterol diet, and weight control.  You are otherwise up to date with prevention measures today.  Please keep your appointments with your specialists as you may have planned  Please go to the LAB in the Basement (turn left off the elevator) for the tests to be done today  You will be contacted by phone if any changes need to be made immediately.  Otherwise, you will receive a letter about your results with an explanation, but please check with MyChart first.  Please remember to sign up for MyChart if you have not done so, as this will be important to you in the future with finding out test results, communicating by private email, and scheduling acute appointments online when needed.  Please return in 1 year for your yearly visit, or sooner if needed, with Lab testing done 3-5 days before

## 2015-01-02 NOTE — Assessment & Plan Note (Signed)
Ok for lexapro 10 qd,  to f/u any worsening symptoms or concerns  

## 2015-01-02 NOTE — Progress Notes (Signed)
Subjective:    Patient ID: Claire Irwin, female    DOB: [birth year], 44 y.o.   MRN: [id number]  HPI  Here for wellness and f/u;  Overall doing ok;  Pt denies Chest pain, worsening SOB, DOE, wheezing, orthopnea, PND, worsening LE edema, palpitations, dizziness or syncope.  Pt denies neurological change such as new headache, facial or extremity weakness.  Pt denies polydipsia, polyuria, or low sugar symptoms. Pt states overall good compliance with treatment and medications, good tolerability, and has been trying to follow appropriate diet.  Pt denies worsening depressive symptoms, suicidal ideation but has had incresaed anxiety recently, several times near panic.  No fever, night sweats, wt loss, loss of appetite, or other constitutional symptoms.  Pt states good ability with ADL's, has low fall risk, home safety reviewed and adequate, no other significant changes in hearing or vision, and only occasionally active with exercise.  Mild LE edema with venous insuff no change. Past Medical History  Diagnosis Date  . Allergy   . GERD (gastroesophageal reflux disease)   . Asthma   . Fibrocystic breast disease   . History of endometriosis   . Preeclampsia   . 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 Hyperlipidemia 06/29/2011   Past Surgical History  Procedure Laterality Date  . Cesarean section  02/2004  . Dilation and curettage, diagnostic / therapeutic    . Abdominal adhesion surgery  07/2004    reports that she has never smoked. She does not have any smokeless tobacco history on file. She reports that she does not drink alcohol or use illicit drugs. family history includes Allergies in her maternal grandmother and mother; Asthma in her maternal grandmother and mother; Cancer in her paternal grandmother; Diabetes in her father and mother; Heart disease in her paternal uncle; Hypertension in her mother. Allergies  Allergen Reactions  . Hydrocodone   . Sulfonamide Derivatives     Current Outpatient Prescriptions on File Prior to Visit  Medication Sig Dispense Refill  . ADVAIR DISKUS 250-50 MCG/DOSE AEPB USE 1 PUFF TWO TIMES DAILY AS NEEDED 60 each 11  . cyclobenzaprine (FLEXERIL) 5 MG tablet Take 1 tablet (5 mg total) by mouth 3 (three) times daily as needed for muscle spasms. 60 tablet 1  . hydrochlorothiazide (HYDRODIURIL) 25 MG tablet Take 1 tablet (25 mg total) by mouth daily. 90 tablet 3  . NORETHINDRONE ACETATE PO Take by mouth.      Marland Kitchen omeprazole (PRILOSEC) 20 MG capsule Take 1 capsule (20 mg total) by mouth 2 (two) times daily before a meal. 180 capsule 3  . potassium chloride (KLOR-CON 10) 10 MEQ tablet Take 1 tablet (10 mEq total) by mouth daily. 90 tablet 3  . PROAIR HFA 108 (90 BASE) MCG/ACT inhaler USE 2 PUFFS 4 TIMES DAILY AS NEEDED 8.5 each 5  . fexofenadine (ALLEGRA) 180 MG tablet Take 1 tablet (180 mg total) by mouth daily. 30 tablet 2  . fluticasone (FLONASE) 50 MCG/ACT nasal spray 2 sprays by Nasal route daily. 16 g 2   No current facility-administered medications on file prior to visit.   Review of Systems Constitutional: Negative for increased diaphoresis, other activity, appetite or siginficant weight change other than noted HENT: Negative for worsening hearing loss, ear pain, facial swelling, mouth sores and neck stiffness.   Eyes: Negative for other worsening pain, redness or visual disturbance.  Respiratory: Negative for shortness of breath and wheezing  Cardiovascular: Negative for chest pain and  palpitations.  Gastrointestinal: Negative for diarrhea, blood in stool, abdominal distention or other pain Genitourinary: Negative for hematuria, flank pain or change in urine volume.  Musculoskeletal: Negative for myalgias or other joint complaints.  Skin: Negative for color change and wound or drainage.  Neurological: Negative for syncope and numbness. other than noted Hematological: Negative for adenopathy. or other  swelling Psychiatric/Behavioral: Negative for hallucinations, SI, self-injury, decreased concentration or other worsening agitation.      Objective:   Physical Exam BP 118/82 mmHg  Pulse 102  Temp(Src) 98 F (36.7 C) (Oral)  Resp 18  Ht 5\' 4"  (1.626 m)  Wt 199 lb (90.266 kg)  BMI 34.14 kg/m2  SpO2 98% VS noted,  Constitutional: Pt is oriented to person, place, and time. Appears well-developed and well-nourished, in no significant distress Head: Normocephalic and atraumatic.  Right Ear: External ear normal.  Left Ear: External ear normal.  Nose: Nose normal.  Mouth/Throat: Oropharynx is clear and moist.  Eyes: Conjunctivae and EOM are normal. Pupils are equal, round, and reactive to light.  Neck: Normal range of motion. Neck supple. No JVD present. No tracheal deviation present or significant neck LA or mass Cardiovascular: Normal rate, regular rhythm, normal heart sounds and intact distal pulses.   Pulmonary/Chest: Effort normal and breath sounds without rales or wheezing  Abdominal: Soft. Bowel sounds are normal. NT. No HSM  Musculoskeletal: Normal range of motion. Exhibits no edema.  Lymphadenopathy:  Has no cervical adenopathy.  Neurological: Pt is alert and oriented to person, place, and time. Pt has normal reflexes. No cranial nerve deficit. Motor grossly intact Skin: Skin is warm and dry. No rash noted.  Psychiatric:  Has nervous mood and affect. Behavior is normal.     Assessment & Plan:

## 2015-01-02 NOTE — Progress Notes (Signed)
Pre visit review using our clinic review tool, if applicable. No additional management support is needed unless otherwise documented below in the visit note. 

## 2015-01-02 NOTE — Assessment & Plan Note (Signed)

## 2015-01-10 ENCOUNTER — Other Ambulatory Visit (INDEPENDENT_AMBULATORY_CARE_PROVIDER_SITE_OTHER): Payer: BC Managed Care – PPO

## 2015-01-10 DIAGNOSIS — Z0000 Encounter for general adult medical examination without abnormal findings: Secondary | ICD-10-CM

## 2015-01-10 LAB — CBC WITH DIFFERENTIAL/PLATELET
Basophils Absolute: 0 10*3/uL (ref 0.0–0.1)
Basophils Relative: 0.4 % (ref 0.0–3.0)
EOS ABS: 0.2 10*3/uL (ref 0.0–0.7)
Eosinophils Relative: 2.5 % (ref 0.0–5.0)
HCT: 42.3 % (ref 36.0–46.0)
Hemoglobin: 14.6 g/dL (ref 12.0–15.0)
Lymphocytes Relative: 39.7 % (ref 12.0–46.0)
Lymphs Abs: 2.5 10*3/uL (ref 0.7–4.0)
MCHC: 34.6 g/dL (ref 30.0–36.0)
MCV: 86.6 fl (ref 78.0–100.0)
MONO ABS: 0.5 10*3/uL (ref 0.1–1.0)
Monocytes Relative: 8.2 % (ref 3.0–12.0)
NEUTROS PCT: 49.2 % (ref 43.0–77.0)
Neutro Abs: 3.2 10*3/uL (ref 1.4–7.7)
PLATELETS: 322 10*3/uL (ref 150.0–400.0)
RBC: 4.88 Mil/uL (ref 3.87–5.11)
RDW: 13.4 % (ref 11.5–15.5)
WBC: 6.4 10*3/uL (ref 4.0–10.5)

## 2015-01-10 LAB — HEPATIC FUNCTION PANEL
ALBUMIN: 3.9 g/dL (ref 3.5–5.2)
ALT: 21 U/L (ref 0–35)
AST: 19 U/L (ref 0–37)
Alkaline Phosphatase: 65 U/L (ref 39–117)
Bilirubin, Direct: 0.1 mg/dL (ref 0.0–0.3)
Total Bilirubin: 0.6 mg/dL (ref 0.2–1.2)
Total Protein: 7.2 g/dL (ref 6.0–8.3)

## 2015-01-10 LAB — LIPID PANEL
CHOLESTEROL: 151 mg/dL (ref 0–200)
HDL: 43.7 mg/dL (ref >39.00)
LDL CALC: 94 mg/dL (ref 0–99)
NonHDL: 107.3
TRIGLYCERIDES: 68 mg/dL (ref 0.0–149.0)
Total CHOL/HDL Ratio: 3
VLDL: 13.6 mg/dL (ref 0.0–40.0)

## 2015-01-10 LAB — BASIC METABOLIC PANEL
BUN: 9 mg/dL (ref 6–23)
CO2: 27 meq/L (ref 19–32)
Calcium: 9.4 mg/dL (ref 8.4–10.5)
Chloride: 103 mEq/L (ref 96–112)
Creatinine, Ser: 1.14 mg/dL (ref 0.40–1.20)
GFR: 66.79 mL/min (ref >60.00)
Glucose, Bld: 123 mg/dL — ABNORMAL HIGH (ref 70–99)
Potassium: 3.7 mEq/L (ref 3.5–5.1)
SODIUM: 138 meq/L (ref 135–145)

## 2015-01-10 LAB — URINALYSIS, ROUTINE W REFLEX MICROSCOPIC
BILIRUBIN URINE: NEGATIVE
KETONES UR: NEGATIVE
Leukocytes, UA: NEGATIVE
Nitrite: NEGATIVE
SPECIFIC GRAVITY, URINE: 1.02 (ref 1.000–1.030)
Total Protein, Urine: NEGATIVE
UROBILINOGEN UA: 0.2 (ref 0.0–1.0)
Urine Glucose: NEGATIVE
pH: 6 (ref 5.0–8.0)

## 2015-01-10 LAB — TSH: TSH: 1.34 u[IU]/mL (ref 0.35–4.50)

## 2015-05-26 ENCOUNTER — Other Ambulatory Visit: Payer: Self-pay | Admitting: Internal Medicine

## 2015-05-28 ENCOUNTER — Other Ambulatory Visit: Payer: Self-pay | Admitting: Internal Medicine

## 2015-06-15 ENCOUNTER — Other Ambulatory Visit: Payer: Self-pay | Admitting: Internal Medicine

## 2015-09-17 ENCOUNTER — Ambulatory Visit (INDEPENDENT_AMBULATORY_CARE_PROVIDER_SITE_OTHER): Payer: BC Managed Care – PPO | Admitting: Internal Medicine

## 2015-09-17 ENCOUNTER — Encounter: Payer: Self-pay | Admitting: Internal Medicine

## 2015-09-17 VITALS — BP 130/76 | HR 88 | Temp 98.9°F | Resp 20 | Ht 65.0 in | Wt 199.5 lb

## 2015-09-17 DIAGNOSIS — R05 Cough: Secondary | ICD-10-CM | POA: Diagnosis not present

## 2015-09-17 DIAGNOSIS — H6091 Unspecified otitis externa, right ear: Secondary | ICD-10-CM | POA: Diagnosis not present

## 2015-09-17 DIAGNOSIS — J209 Acute bronchitis, unspecified: Secondary | ICD-10-CM

## 2015-09-17 DIAGNOSIS — R062 Wheezing: Secondary | ICD-10-CM

## 2015-09-17 DIAGNOSIS — R059 Cough, unspecified: Secondary | ICD-10-CM

## 2015-09-17 MED ORDER — PREDNISONE 10 MG PO TABS: ORAL_TABLET | ORAL | Status: DC

## 2015-09-17 MED ORDER — LEVOFLOXACIN 500 MG PO TABS: 500.0000 mg | ORAL_TABLET | Freq: Every day | ORAL | Status: DC

## 2015-09-17 MED ORDER — PROMETHAZINE-CODEINE 6.25-10 MG/5ML PO SYRP: 5.0000 mL | ORAL_SOLUTION | ORAL | Status: DC | PRN

## 2015-09-17 MED ORDER — NEOMYCIN-POLYMYXIN-HC 1 % OT SOLN: 3.0000 [drp] | Freq: Four times a day (QID) | OTIC | Status: DC

## 2015-09-17 MED ORDER — POTASSIUM CHLORIDE ER 10 MEQ PO TBCR: 10.0000 meq | EXTENDED_RELEASE_TABLET | Freq: Every day | ORAL | Status: DC

## 2015-09-17 NOTE — Progress Notes (Signed)
Pre visit review using our clinic review tool, if applicable. No additional management support is needed unless otherwise documented below in the visit note. 

## 2015-09-17 NOTE — Progress Notes (Signed)
Subjective:    Patient ID: Claire Irwin, female    DOB: 25-Mar-1971, 45 y.o.   MRN: [id number]  HPI  Here with acute onset mild to mod 2-3 days severe right ear pain, without d/c or hearing loss, but+ ST, HA, general weakness and malaise, with prod cough greenish sputum, but Pt denies chest pain, increased sob or doe, wheezing, orthopnea, PND, increased LE swelling, palpitations, dizziness or syncope, except for onset mild wheezing/sob/doe last 2 days having to increase inhaler use.   Pt denies new neurological symptoms such as new headache, or facial or extremity weakness or numbness except for the above.   Pt denies polydipsia, polyuria, Past Medical History  Diagnosis Date  . Allergy   . GERD (gastroesophageal reflux disease)   . Asthma   . Fibrocystic breast disease   . History of endometriosis   . Preeclampsia   . 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 Hyperlipidemia 06/29/2011   Past Surgical History  Procedure Laterality Date  . Cesarean section  02/2004  . Dilation and curettage, diagnostic / therapeutic    . Abdominal adhesion surgery  07/2004    reports that she has never smoked. She does not have any smokeless tobacco history on file. She reports that she does not drink alcohol or use illicit drugs. family history includes Allergies in her maternal grandmother and mother; Asthma in her maternal grandmother and mother; Cancer in her paternal grandmother; Diabetes in her father and mother; Heart disease in her paternal uncle; Hypertension in her mother. Allergies  Allergen Reactions  . Hydrocodone   . Sulfonamide Derivatives    Current Outpatient Prescriptions on File Prior to Visit  Medication Sig Dispense Refill  . ADVAIR DISKUS 250-50 MCG/DOSE AEPB USE 1 PUFF TWO TIMES DAILY AS NEEDED 60 each 11  . cyclobenzaprine (FLEXERIL) 5 MG tablet TAKE 1 TABLET (5 MG TOTAL) BY MOUTH 3 (THREE) TIMES DAILY AS NEEDED FOR MUSCLE SPASMS. 60 tablet 0  .  hydrochlorothiazide (HYDRODIURIL) 25 MG tablet TAKE 1 TABLET (25 MG TOTAL) BY MOUTH DAILY. 90 tablet 1  . NORETHINDRONE ACETATE PO Take by mouth.      Marland Kitchen omeprazole (PRILOSEC) 20 MG capsule TAKE 1 CAPSULE (20 MG TOTAL) BY MOUTH 2 (TWO) TIMES DAILY BEFORE A MEAL. 180 capsule 1  . PROAIR HFA 108 (90 BASE) MCG/ACT inhaler USE 2 PUFFS 4 TIMES DAILY AS NEEDED 8.5 each 5  . escitalopram (LEXAPRO) 10 MG tablet Take 1 tablet (10 mg total) by mouth daily. 90 tablet 3  . fexofenadine (ALLEGRA) 180 MG tablet Take 1 tablet (180 mg total) by mouth daily. 30 tablet 2  . fluticasone (FLONASE) 50 MCG/ACT nasal spray 2 sprays by Nasal route daily. 16 g 2   No current facility-administered medications on file prior to visit.   Review of Systems  Constitutional: Negative for unusual diaphoresis or night sweats HENT: Negative for ringing in ear or discharge Eyes: Negative for double vision or worsening visual disturbance.  Respiratory: Negative for choking and stridor.   Gastrointestinal: Negative for vomiting or other signifcant bowel change Genitourinary: Negative for hematuria or change in urine volume.  Musculoskeletal: Negative for other MSK pain or swelling Skin: Negative for color change and worsening wound.  Neurological: Negative for tremors and numbness other than noted  Psychiatric/Behavioral: Negative for decreased concentration or agitation other than above       Objective:   Physical Exam BP 130/76 mmHg  Pulse 88  Temp(Src) 98.9 F (37.2 C) (Oral)  Resp 20  Ht 5\' 5"  (1.651 m)  Wt 199 lb 8 oz (90.493 kg)  BMI 33.20 kg/m2  SpO2 99% VS noted, mild ill Constitutional: Pt appears in no significant distress HENT: Head: NCAT.  Right Ear: External ear normal. Right TM with severe erythema, canal with 1-2+ red/swelling and mucoid d/c  Left Ear: External ear normal. Left tm slight erythema, canal without significant red or swelling Eyes: . Pupils are equal, round, and reactive to light.  Conjunctivae and EOM are normal Neck: Normal range of motion. Neck supple.  Cardiovascular: Normal rate and regular rhythm.   Pulmonary/Chest: Effort normal and breath sounds without rales or wheezing.  Abd:  Soft, NT, ND, + BS Neurological: Pt is alert. Not confused , motor grossly intact Skin: Skin is warm. No rash, no LE edema Psychiatric: Pt behavior is normal. No agitation.    Assessment & Plan:

## 2015-09-17 NOTE — Patient Instructions (Signed)
Please take all new medication as prescribed - the ear drops, antibiotic pills, cough medicine, and prednisone  Please continue all other medications as before, including the inhaler as needed  Please have the pharmacy call with any other refills you may need.  Please keep your appointments with your specialists as you may have planned

## 2015-09-18 NOTE — Assessment & Plan Note (Addendum)
Mild to mod, c/w bronchitis vs pna, for antibx course, cough med prn,  to f/u any worsening symptoms or concerns 

## 2015-09-18 NOTE — Assessment & Plan Note (Signed)
/  Mild to mod, for predpac asd,  to f/u any worsening symptoms or concerns 

## 2015-09-18 NOTE — Assessment & Plan Note (Signed)
Mild to mod, for antibx course,  to f/u any worsening symptoms or concerns 

## 2015-09-23 ENCOUNTER — Telehealth: Payer: Self-pay | Admitting: Internal Medicine

## 2015-09-23 NOTE — Telephone Encounter (Signed)
Please advise, thanks.

## 2015-09-23 NOTE — Telephone Encounter (Signed)
Pt was seen last Tuesday and her ear canal still has not opened up. She is wondering should she take something like a decongestant or should she come back in to be seen She can be reached at [phone] She is aware Dr. Jenny Reichmann is out till Tuesday and is all right waiting

## 2015-09-24 NOTE — Telephone Encounter (Signed)
Pt advised in detail 

## 2015-09-24 NOTE — Telephone Encounter (Signed)
OK to try change the allegra  To Allegra D  - OTC prn, and let me know if she wants ENT referral if not improved

## 2015-12-06 ENCOUNTER — Encounter: Payer: Self-pay | Admitting: Family

## 2015-12-06 ENCOUNTER — Ambulatory Visit (INDEPENDENT_AMBULATORY_CARE_PROVIDER_SITE_OTHER): Payer: BC Managed Care – PPO | Admitting: Family

## 2015-12-06 VITALS — BP 116/76 | HR 103 | Temp 99.1°F | Resp 16 | Ht 65.0 in | Wt 198.8 lb

## 2015-12-06 DIAGNOSIS — R05 Cough: Secondary | ICD-10-CM | POA: Diagnosis not present

## 2015-12-06 DIAGNOSIS — R059 Cough, unspecified: Secondary | ICD-10-CM

## 2015-12-06 MED ORDER — FLUTICASONE-SALMETEROL 250-50 MCG/DOSE IN AEPB: INHALATION_SPRAY | RESPIRATORY_TRACT | Status: DC

## 2015-12-06 MED ORDER — PREDNISONE 20 MG PO TABS: 20.0000 mg | ORAL_TABLET | Freq: Two times a day (BID) | ORAL | Status: DC

## 2015-12-06 MED ORDER — AMOXICILLIN-POT CLAVULANATE 875-125 MG PO TABS: 1.0000 | ORAL_TABLET | Freq: Two times a day (BID) | ORAL | Status: DC

## 2015-12-06 MED ORDER — PROMETHAZINE-CODEINE 6.25-10 MG/5ML PO SYRP: 5.0000 mL | ORAL_SOLUTION | ORAL | Status: DC | PRN

## 2015-12-06 NOTE — Progress Notes (Signed)
Subjective:    Patient ID: Claire Irwin, female    DOB: 1971-01-20, 45 y.o.   MRN: [id number]  Chief Complaint  Patient presents with  . Cough    x2 weeks has had a cough, has been coughing so much that her head has been hurting    HPI:  Claire Irwin is a 45 y.o. female who  has a past medical history of Allergy; GERD (gastroesophageal reflux disease); Asthma; Fibrocystic breast disease; History of endometriosis; Preeclampsia; DJD (degenerative joint disease) of knee; and Hyperlipidemia (06/29/2011). and presents today For an acute office visit.  This is a new problem. Associated symptoms of cough have been going on for approximately 2 weeks. Severity of the cough is enough that makes her head hurt. Modifying factors include Delsym and cough drops which have helped some. She has been having to use her albuterol/rescue inhaler more. Productive cough with purulent sputum. Timing of the symptoms is fairly consistent throughout the day. Course of the symptoms has improved slightly but remains. No recent antibiotics.   Allergies  Allergen Reactions  . Hydrocodone   . Sulfonamide Derivatives      Current Outpatient Prescriptions on File Prior to Visit  Medication Sig Dispense Refill  . cyclobenzaprine (FLEXERIL) 5 MG tablet TAKE 1 TABLET (5 MG TOTAL) BY MOUTH 3 (THREE) TIMES DAILY AS NEEDED FOR MUSCLE SPASMS. 60 tablet 0  . hydrochlorothiazide (HYDRODIURIL) 25 MG tablet TAKE 1 TABLET (25 MG TOTAL) BY MOUTH DAILY. 90 tablet 1  . NEOMYCIN-POLYMYXIN-HYDROCORTISONE (CORTISPORIN) 1 % SOLN otic solution Place 3 drops into the right ear every 6 (six) hours. 10 mL 0  . NORETHINDRONE ACETATE PO Take by mouth.      Marland Kitchen omeprazole (PRILOSEC) 20 MG capsule TAKE 1 CAPSULE (20 MG TOTAL) BY MOUTH 2 (TWO) TIMES DAILY BEFORE A MEAL. 180 capsule 1  . potassium chloride (KLOR-CON 10) 10 MEQ tablet Take 1 tablet (10 mEq total) by mouth daily. 90 tablet 3  . PROAIR HFA 108 (90 BASE) MCG/ACT inhaler USE  2 PUFFS 4 TIMES DAILY AS NEEDED 8.5 each 5   No current facility-administered medications on file prior to visit.    Past Medical History  Diagnosis Date  . Allergy   . GERD (gastroesophageal reflux disease)   . Asthma   . Fibrocystic breast disease   . History of endometriosis   . Preeclampsia   . 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 Hyperlipidemia 06/29/2011    Review of Systems  Constitutional: Negative for fever and chills.  HENT: Positive for congestion, sinus pressure and sore throat.   Respiratory: Positive for cough. Negative for chest tightness and shortness of breath.   Neurological: Positive for headaches.      Objective:    BP 116/76 mmHg  Pulse 103  Temp(Src) 99.1 F (37.3 C) (Oral)  Resp 16  Ht 5\' 5"  (1.651 m)  Wt 198 lb 12.8 oz (90.175 kg)  BMI 33.08 kg/m2  SpO2 97% Nursing note and vital signs reviewed.  Physical Exam  Constitutional: She is oriented to person, place, and time. She appears well-developed and well-nourished. No distress.  HENT:  Right Ear: Hearing, tympanic membrane, external ear and ear canal normal.  Left Ear: Hearing, tympanic membrane, external ear and ear canal normal.  Nose: Right sinus exhibits maxillary sinus tenderness and frontal sinus tenderness. Left sinus exhibits maxillary sinus tenderness and frontal sinus tenderness.  Mouth/Throat: Uvula is midline, oropharynx is clear and  moist and mucous membranes are normal.  Neck: Neck supple.  Cardiovascular: Normal rate, regular rhythm, normal heart sounds and intact distal pulses.   Pulmonary/Chest: Effort normal and breath sounds normal.  Neurological: She is alert and oriented to person, place, and time.  Skin: Skin is warm and dry.  Psychiatric: She has a normal mood and affect. Her behavior is normal. Judgment and thought content normal.       Assessment & Plan:   Problem List Items Addressed This Visit      Other   Cough - Primary    Symptoms and  exam consistent with bacterial sinusitis with possible concern for underlying asthmatic bronchitis. Start Augmentin. Start promethazine-codeine for cough and sleep. Start prednisone as needed for cough and wheezing. Continue over-the-counter medications as needed for symptom relief and supportive care. Follow-up if symptoms worsen or fail to improve.      Relevant Medications   promethazine-codeine (PHENERGAN WITH CODEINE) 6.25-10 MG/5ML syrup   amoxicillin-clavulanate (AUGMENTIN) 875-125 MG tablet   Fluticasone-Salmeterol (ADVAIR DISKUS) 250-50 MCG/DOSE AEPB   predniSONE (DELTASONE) 20 MG tablet       I have discontinued Ms. Santmyer's fexofenadine, fluticasone, escitalopram, levofloxacin, and predniSONE. I have also changed her ADVAIR DISKUS to Fluticasone-Salmeterol. Additionally, I am having her start on amoxicillin-clavulanate and predniSONE. Lastly, I am having her maintain her NORETHINDRONE ACETATE PO, PROAIR HFA, cyclobenzaprine, omeprazole, hydrochlorothiazide, potassium chloride, NEOMYCIN-POLYMYXIN-HYDROCORTISONE, and promethazine-codeine.   Meds ordered this encounter  Medications  . promethazine-codeine (PHENERGAN WITH CODEINE) 6.25-10 MG/5ML syrup    Sig: Take 5 mLs by mouth every 4 (four) hours as needed for cough.    Dispense:  180 mL    Refill:  0    Order Specific Question:  Supervising Provider    Answer:  Pricilla Holm A [id number]  . amoxicillin-clavulanate (AUGMENTIN) 875-125 MG tablet    Sig: Take 1 tablet by mouth 2 (two) times daily.    Dispense:  20 tablet    Refill:  0    Order Specific Question:  Supervising Provider    Answer:  Pricilla Holm A [id number]  . Fluticasone-Salmeterol (ADVAIR DISKUS) 250-50 MCG/DOSE AEPB    Sig: USE 1 PUFF TWO TIMES DAILY AS NEEDED    Dispense:  60 each    Refill:  11    Order Specific Question:  Supervising Provider    Answer:  Pricilla Holm A [id number]  . predniSONE (DELTASONE) 20 MG tablet    Sig: Take 1 tablet (20  mg total) by mouth 2 (two) times daily with a meal.    Dispense:  10 tablet    Refill:  0    Order Specific Question:  Supervising Provider    Answer:  Pricilla Holm A [id number]     Follow-up: Return if symptoms worsen or fail to improve.  Mauricio Po, FNP

## 2015-12-06 NOTE — Patient Instructions (Signed)
Thank you for choosing [facility].  Summary/Instructions:  Your prescription(s) have been submitted to your pharmacy or been printed and provided for you. Please take as directed and contact our office if you believe you are having problem(s) with the medication(s) or have any questions.  If your symptoms worsen or fail to improve, please contact our office for further instruction, or in case of emergency go directly to the emergency room at the closest medical facility.   General Recommendations:    Please drink plenty of fluids.  Get plenty of rest   Sleep in humidified air  Use saline nasal sprays  Netti pot   OTC Medications:  Decongestants - helps relieve congestion   Flonase (generic fluticasone) or Nasacort (generic triamcinolone) - please make sure to use the "cross-over" technique at a 45 degree angle towards the opposite eye as opposed to straight up the nasal passageway.   Sudafed (generic pseudoephedrine - Note this is the one that is available behind the pharmacy counter); Products with phenylephrine (-PE) may also be used but is often not as effective as pseudoephedrine.   If you have HIGH BLOOD PRESSURE - Coricidin HBP; AVOID any product that is -D as this contains pseudoephedrine which may increase your blood pressure.  Afrin (oxymetazoline) every 6-8 hours for up to 3 days.   Allergies - helps relieve runny nose, itchy eyes and sneezing   Claritin (generic loratidine), Allegra (fexofenidine), or Zyrtec (generic cyrterizine) for runny nose. These medications should not cause drowsiness.  Note - Benadryl (generic diphenhydramine) may be used however may cause drowsiness  Cough -   Delsym or Robitussin (generic dextromethorphan)  Expectorants - helps loosen mucus to ease removal   Mucinex (generic guaifenesin) as directed on the package.  Headaches / General Aches   Tylenol (generic acetaminophen) - DO NOT EXCEED 3 grams (3,000 mg) in a 24  hour time period  Advil/Motrin (generic ibuprofen)   Sore Throat -   Salt water gargle   Chloraseptic (generic benzocaine) spray or lozenges / Sucrets (generic dyclonine)    Sinusitis Sinusitis is redness, soreness, and inflammation of the paranasal sinuses. Paranasal sinuses are air pockets within the bones of your face (beneath the eyes, the middle of the forehead, or above the eyes). In healthy paranasal sinuses, mucus is able to drain out, and air is able to circulate through them by way of your nose. However, when your paranasal sinuses are inflamed, mucus and air can become trapped. This can allow bacteria and other germs to grow and cause infection. Sinusitis can develop quickly and last only a short time (acute) or continue over a long period (chronic). Sinusitis that lasts for more than 12 weeks is considered chronic.  CAUSES  Causes of sinusitis include:  Allergies.  Structural abnormalities, such as displacement of the cartilage that separates your nostrils (deviated septum), which can decrease the air flow through your nose and sinuses and affect sinus drainage.  Functional abnormalities, such as when the small hairs (cilia) that line your sinuses and help remove mucus do not work properly or are not present. SIGNS AND SYMPTOMS  Symptoms of acute and chronic sinusitis are the same. The primary symptoms are pain and pressure around the affected sinuses. Other symptoms include:  Upper toothache.  Earache.  Headache.  Bad breath.  Decreased sense of smell and taste.  A cough, which worsens when you are lying flat.  Fatigue.  Fever.  Thick drainage from your nose, which often is green and may   contain pus (purulent).  Swelling and warmth over the affected sinuses. DIAGNOSIS  Your health care provider will perform a physical exam. During the exam, your health care provider may:  Look in your nose for signs of abnormal growths in your nostrils (nasal  polyps).  Tap over the affected sinus to check for signs of infection.  View the inside of your sinuses (endoscopy) using an imaging device that has a light attached (endoscope). If your health care provider suspects that you have chronic sinusitis, one or more of the following tests may be recommended:  Allergy tests.  Nasal culture. A sample of mucus is taken from your nose, sent to a lab, and screened for bacteria.  Nasal cytology. A sample of mucus is taken from your nose and examined by your health care provider to determine if your sinusitis is related to an allergy. TREATMENT  Most cases of acute sinusitis are related to a viral infection and will resolve on their own within 10 days. Sometimes medicines are prescribed to help relieve symptoms (pain medicine, decongestants, nasal steroid sprays, or saline sprays).  However, for sinusitis related to a bacterial infection, your health care provider will prescribe antibiotic medicines. These are medicines that will help kill the bacteria causing the infection.  Rarely, sinusitis is caused by a fungal infection. In theses cases, your health care provider will prescribe antifungal medicine. For some cases of chronic sinusitis, surgery is needed. Generally, these are cases in which sinusitis recurs more than 3 times per year, despite other treatments. HOME CARE INSTRUCTIONS   Drink plenty of water. Water helps thin the mucus so your sinuses can drain more easily.  Use a humidifier.  Inhale steam 3 to 4 times a day (for example, sit in the bathroom with the shower running).  Apply a warm, moist washcloth to your face 3 to 4 times a day, or as directed by your health care provider.  Use saline nasal sprays to help moisten and clean your sinuses.  Take medicines only as directed by your health care provider.  If you were prescribed either an antibiotic or antifungal medicine, finish it all even if you start to feel better. SEEK IMMEDIATE  MEDICAL CARE IF:  You have increasing pain or severe headaches.  You have nausea, vomiting, or drowsiness.  You have swelling around your face.  You have vision problems.  You have a stiff neck.  You have difficulty breathing. MAKE SURE YOU:   Understand these instructions.  Will watch your condition.  Will get help right away if you are not doing well or get worse. Document Released: 08/17/2005 Document Revised: 01/01/2014 Document Reviewed: 09/01/2011 ExitCare Patient Information 2015 [employer]. This information is not intended to replace advice given to you by your health care provider. Make sure you discuss any questions you have with your health care provider.   

## 2015-12-06 NOTE — Assessment & Plan Note (Signed)
Symptoms and exam consistent with bacterial sinusitis with possible concern for underlying asthmatic bronchitis. Start Augmentin. Start promethazine-codeine for cough and sleep. Start prednisone as needed for cough and wheezing. Continue over-the-counter medications as needed for symptom relief and supportive care. Follow-up if symptoms worsen or fail to improve.

## 2015-12-06 NOTE — Progress Notes (Signed)
Pre visit review using our clinic review tool, if applicable. No additional management support is needed unless otherwise documented below in the visit note. 

## 2016-02-06 ENCOUNTER — Other Ambulatory Visit (INDEPENDENT_AMBULATORY_CARE_PROVIDER_SITE_OTHER): Payer: BC Managed Care – PPO

## 2016-02-06 DIAGNOSIS — Z0000 Encounter for general adult medical examination without abnormal findings: Secondary | ICD-10-CM | POA: Diagnosis not present

## 2016-02-06 LAB — BASIC METABOLIC PANEL
BUN: 8 mg/dL (ref 6–23)
CALCIUM: 9.8 mg/dL (ref 8.4–10.5)
CO2: 27 meq/L (ref 19–32)
CREATININE: 1.13 mg/dL (ref 0.40–1.20)
Chloride: 103 mEq/L (ref 96–112)
GFR: 67.14 mL/min (ref >60.00)
Glucose, Bld: 110 mg/dL — ABNORMAL HIGH (ref 70–99)
Potassium: 3.9 mEq/L (ref 3.5–5.1)
Sodium: 139 mEq/L (ref 135–145)

## 2016-02-06 LAB — URINALYSIS, ROUTINE W REFLEX MICROSCOPIC
Bilirubin Urine: NEGATIVE
Hgb urine dipstick: NEGATIVE
KETONES UR: NEGATIVE
LEUKOCYTES UA: NEGATIVE
NITRITE: NEGATIVE
PH: 6 (ref 5.0–8.0)
Specific Gravity, Urine: 1.01 (ref 1.000–1.030)
TOTAL PROTEIN, URINE-UPE24: NEGATIVE
URINE GLUCOSE: NEGATIVE
UROBILINOGEN UA: 0.2 (ref 0.0–1.0)

## 2016-02-06 LAB — LIPID PANEL
CHOL/HDL RATIO: 3
Cholesterol: 167 mg/dL (ref 0–200)
HDL: 50.2 mg/dL (ref >39.00)
LDL CALC: 99 mg/dL (ref 0–99)
NONHDL: 117.17
Triglycerides: 92 mg/dL (ref 0.0–149.0)
VLDL: 18.4 mg/dL (ref 0.0–40.0)

## 2016-02-06 LAB — CBC WITH DIFFERENTIAL/PLATELET
BASOS ABS: 0 10*3/uL (ref 0.0–0.1)
Basophils Relative: 0.5 % (ref 0.0–3.0)
EOS PCT: 1.7 % (ref 0.0–5.0)
Eosinophils Absolute: 0.1 10*3/uL (ref 0.0–0.7)
HCT: 44.4 % (ref 36.0–46.0)
HEMOGLOBIN: 15 g/dL (ref 12.0–15.0)
Lymphocytes Relative: 40 % (ref 12.0–46.0)
Lymphs Abs: 3 10*3/uL (ref 0.7–4.0)
MCHC: 33.8 g/dL (ref 30.0–36.0)
MCV: 85.8 fl (ref 78.0–100.0)
MONO ABS: 0.5 10*3/uL (ref 0.1–1.0)
MONOS PCT: 7 % (ref 3.0–12.0)
Neutro Abs: 3.8 10*3/uL (ref 1.4–7.7)
Neutrophils Relative %: 50.8 % (ref 43.0–77.0)
Platelets: 302 10*3/uL (ref 150.0–400.0)
RBC: 5.17 Mil/uL — AB (ref 3.87–5.11)
RDW: 14.3 % (ref 11.5–15.5)
WBC: 7.5 10*3/uL (ref 4.0–10.5)

## 2016-02-06 LAB — HEPATIC FUNCTION PANEL
ALBUMIN: 4.5 g/dL (ref 3.5–5.2)
ALK PHOS: 71 U/L (ref 39–117)
ALT: 24 U/L (ref 0–35)
AST: 21 U/L (ref 0–37)
Bilirubin, Direct: 0.1 mg/dL (ref 0.0–0.3)
TOTAL PROTEIN: 7.7 g/dL (ref 6.0–8.3)
Total Bilirubin: 0.8 mg/dL (ref 0.2–1.2)

## 2016-02-06 LAB — TSH: TSH: 1.2 u[IU]/mL (ref 0.35–4.50)

## 2016-02-14 ENCOUNTER — Encounter: Payer: Self-pay | Admitting: Internal Medicine

## 2016-02-14 ENCOUNTER — Ambulatory Visit (INDEPENDENT_AMBULATORY_CARE_PROVIDER_SITE_OTHER): Payer: BC Managed Care – PPO | Admitting: Internal Medicine

## 2016-02-14 VITALS — BP 128/78 | HR 89 | Temp 98.5°F | Resp 20 | Wt 199.0 lb

## 2016-02-14 DIAGNOSIS — Z0001 Encounter for general adult medical examination with abnormal findings: Secondary | ICD-10-CM

## 2016-02-14 DIAGNOSIS — M79675 Pain in left toe(s): Secondary | ICD-10-CM

## 2016-02-14 DIAGNOSIS — R6889 Other general symptoms and signs: Secondary | ICD-10-CM | POA: Diagnosis not present

## 2016-02-14 DIAGNOSIS — K219 Gastro-esophageal reflux disease without esophagitis: Secondary | ICD-10-CM

## 2016-02-14 DIAGNOSIS — M79609 Pain in unspecified limb: Secondary | ICD-10-CM | POA: Diagnosis not present

## 2016-02-14 DIAGNOSIS — R739 Hyperglycemia, unspecified: Secondary | ICD-10-CM

## 2016-02-14 DIAGNOSIS — M79674 Pain in right toe(s): Secondary | ICD-10-CM | POA: Diagnosis not present

## 2016-02-14 DIAGNOSIS — E119 Type 2 diabetes mellitus without complications: Secondary | ICD-10-CM | POA: Insufficient documentation

## 2016-02-14 MED ORDER — PANTOPRAZOLE SODIUM 40 MG PO TBEC: 40.0000 mg | DELAYED_RELEASE_TABLET | Freq: Every day | ORAL | Status: DC

## 2016-02-14 NOTE — Assessment & Plan Note (Signed)

## 2016-02-14 NOTE — Assessment & Plan Note (Signed)
Fleeting pains, exam benign, unclear etiology,  to f/u any worsening symptoms or concerns

## 2016-02-14 NOTE — Assessment & Plan Note (Signed)
Achy nightly pain, exam benign, etiology unclear, for tylenol prn,  to f/u any worsening symptoms or concerns

## 2016-02-14 NOTE — Progress Notes (Signed)
Subjective:    Patient ID: Claire Irwin, female    DOB: 16-Jul-1971, 45 y.o.   MRN: [id number]  HPI  Here for wellness and f/u;  Overall doing ok;  Pt denies Chest pain, worsening SOB, DOE, wheezing, orthopnea, PND, worsening LE edema, palpitations, dizziness or syncope.  Pt denies neurological change such as new headache, facial or extremity weakness.  Pt denies polydipsia, polyuria, or low sugar symptoms. Pt states overall good compliance with treatment and medications, good tolerability, and has been trying to follow appropriate diet.  Pt denies worsening depressive symptoms, suicidal ideation or panic. No fever, night sweats, wt loss, loss of appetite, or other constitutional symptoms.  Pt states good ability with ADL's, has low fall risk, home safety reviewed and adequate, no other significant changes in hearing or vision, and only occasionally active with exercise.  Has had mild worsening reflux x months despite current prilosec 20 bid, but no abd pain, dysphagia, n/v, bowel change or blood.    Has also fleeting sharp pains to mult areas such as fingers, large joints, torso, without trauma, fever, rash or erythema, more in the past few months.    Also c/o unusual bilat toe pain, aching type, only at night with lying in bed, only great toes, without trauma, erythema, swelling for several months Past Medical History  Diagnosis Date  . Allergy   . GERD (gastroesophageal reflux disease)   . Asthma   . Fibrocystic breast disease   . History of endometriosis   . Preeclampsia   . 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 Hyperlipidemia 06/29/2011   Past Surgical History  Procedure Laterality Date  . Cesarean section  02/2004  . Dilation and curettage, diagnostic / therapeutic    . Abdominal adhesion surgery  07/2004    reports that she has never smoked. She does not have any smokeless tobacco history on file. She reports that she does not drink alcohol or use illicit  drugs. family history includes Allergies in her maternal grandmother and mother; Asthma in her maternal grandmother and mother; Cancer in her paternal grandmother; Diabetes in her father and mother; Heart disease in her paternal uncle; Hypertension in her mother. Allergies  Allergen Reactions  . Hydrocodone   . Sulfonamide Derivatives    Current Outpatient Prescriptions on File Prior to Visit  Medication Sig Dispense Refill  . cyclobenzaprine (FLEXERIL) 5 MG tablet TAKE 1 TABLET (5 MG TOTAL) BY MOUTH 3 (THREE) TIMES DAILY AS NEEDED FOR MUSCLE SPASMS. 60 tablet 0  . Fluticasone-Salmeterol (ADVAIR DISKUS) 250-50 MCG/DOSE AEPB USE 1 PUFF TWO TIMES DAILY AS NEEDED 60 each 11  . hydrochlorothiazide (HYDRODIURIL) 25 MG tablet TAKE 1 TABLET (25 MG TOTAL) BY MOUTH DAILY. 90 tablet 1  . NEOMYCIN-POLYMYXIN-HYDROCORTISONE (CORTISPORIN) 1 % SOLN otic solution Place 3 drops into the right ear every 6 (six) hours. 10 mL 0  . NORETHINDRONE ACETATE PO Take by mouth.      . potassium chloride (KLOR-CON 10) 10 MEQ tablet Take 1 tablet (10 mEq total) by mouth daily. 90 tablet 3  . predniSONE (DELTASONE) 20 MG tablet Take 1 tablet (20 mg total) by mouth 2 (two) times daily with a meal. 10 tablet 0  . PROAIR HFA 108 (90 BASE) MCG/ACT inhaler USE 2 PUFFS 4 TIMES DAILY AS NEEDED 8.5 each 5   No current facility-administered medications on file prior to visit.    Review of Systems Constitutional: Negative for increased diaphoresis, or other  activity, appetite or siginficant weight change other than noted HENT: Negative for worsening hearing loss, ear pain, facial swelling, mouth sores and neck stiffness.   Eyes: Negative for other worsening pain, redness or visual disturbance.  Respiratory: Negative for choking or stridor Cardiovascular: Negative for other chest pain and palpitations.  Gastrointestinal: Negative for worsening diarrhea, blood in stool, or abdominal distention Genitourinary: Negative for  hematuria, flank pain or change in urine volume.  Musculoskeletal: Negative for myalgias or other joint complaints.  Skin: Negative for other color change and wound or drainage.  Neurological: Negative for syncope and numbness. other than noted Hematological: Negative for adenopathy. or other swelling Psychiatric/Behavioral: Negative for hallucinations, SI, self-injury, decreased concentration or other worsening agitation.      Objective:   Physical Exam BP 128/78 mmHg  Pulse 89  Temp(Src) 98.5 F (36.9 C) (Oral)  Resp 20  Wt 199 lb (90.266 kg)  SpO2 98% VS noted,  Constitutional: Pt is oriented to person, place, and time. Appears well-developed and well-nourished, in no significant distress Head: Normocephalic and atraumatic  Eyes: Conjunctivae and EOM are normal. Pupils are equal, round, and reactive to light Right Ear: External ear normal.  Left Ear: External ear normal Nose: Nose normal.  Mouth/Throat: Oropharynx is clear and moist  Neck: Normal range of motion. Neck supple. No JVD present. No tracheal deviation present or significant neck LA or mass Cardiovascular: Normal rate, regular rhythm, normal heart sounds and intact distal pulses.   Pulmonary/Chest: Effort normal and breath sounds without rales or wheezing  Abdominal: Soft. Bowel sounds are normal. NT. No HSM  Musculoskeletal: Normal range of motion. Exhibits no edema Lymphadenopathy: Has no cervical adenopathy.  Neurological: Pt is alert and oriented to person, place, and time. Pt has normal reflexes. No cranial nerve deficit. Motor grossly intact Skin: Skin is warm and dry. No rash noted or new ulcers Psychiatric:  Has normal mood and affect. Behavior is normal.  No joint effusions or tenderness  Most recent cxr  RADIOLOGY REPORT*  Clinical Data: 45 year old female with lower extremity swelling. Peripheral edema.  CHEST - 2 VIEW  Comparison: None.  Findings: Lung volumes are within normal limits.  Cardiac size and mediastinal contours are within normal limits. Visualized tracheal air column is within normal limits. The lungs are clear. No pneumothorax or effusion. No acute osseous abnormality identified.  IMPRESSION: Negative, no acute cardiopulmonary abnormality.   Original Report Authenticated By: Roselyn Reef, M.D.  Most recent echo summary   Result status: Final result   [facility]          [address], [place] [address]            [phone]  ------------------------------------------------------------ Transthoracic Echocardiography  Patient:  Zaylie, Boniello MR #:    [id number] Study Date: 04/10/2013 Gender:   F Age:    73 Height:   162.6cm Weight:   90.3kg BSA:    1.66m^2 Pt. Status: Room:  ATTENDING  [facility] REFERRING  Biagio Borg PERFORMING  [facility] SONOGRAPHER Barbra Sarks, RDCS cc:  ------------------------------------------------------------ LV EF: 65% -  70%  ------------------------------------------------------------ Indications:   Edema 782.3.  ------------------------------------------------------------ History:  PMH: Asthma. Acquired from the patient and from the patient's chart. Bilateral lower extremity edema. Risk factors: Dyslipidemia.  ------------------------------------------------------------ Study Conclusions  Left ventricle: The cavity size was normal. Wall thickness was increased  in a pattern of mild LVH. Systolic function was vigorous. The estimated ejection fraction was in the range of 65% to 70%. Features are consistent with a pseudonormal left ventricular filling pattern, with concomitant abnormal relaxation and increased filling pressure (grade 2 diastolic dysfunction).       Assessment & Plan:

## 2016-02-14 NOTE — Assessment & Plan Note (Signed)
Mild uncontrolled, no red flags, for change prilosec to protonix 40 qd,  to f/u any worsening symptoms or concerns

## 2016-02-14 NOTE — Patient Instructions (Addendum)
OK to stop the prilosec  Please take all new medication as prescribed - the protonix  Please continue all other medications as before, and refills have been done if requested.  Please have the pharmacy call with any other refills you may need.  Please continue your efforts at being more active, low cholesterol diet, and weight control.  You are otherwise up to date with prevention measures today.  Please keep your appointments with your specialists as you may have planned  You will be contacted regarding the referral for: podiatry  Please return in 1 year for your yearly visit, or sooner if needed, with Lab testing done 3-5 days before

## 2016-02-14 NOTE — Assessment & Plan Note (Signed)
Asympt, minor, cont work on wt control,  to f/u any worsening symptoms or concerns

## 2016-02-14 NOTE — Progress Notes (Signed)
Pre visit review using our clinic review tool, if applicable. No additional management support is needed unless otherwise documented below in the visit note. 

## 2016-03-13 ENCOUNTER — Ambulatory Visit: Payer: BC Managed Care – PPO | Admitting: Podiatry

## 2016-03-16 ENCOUNTER — Encounter: Payer: Self-pay | Admitting: Podiatry

## 2016-03-16 ENCOUNTER — Ambulatory Visit (INDEPENDENT_AMBULATORY_CARE_PROVIDER_SITE_OTHER): Payer: BC Managed Care – PPO

## 2016-03-16 ENCOUNTER — Ambulatory Visit (INDEPENDENT_AMBULATORY_CARE_PROVIDER_SITE_OTHER): Payer: BC Managed Care – PPO | Admitting: Podiatry

## 2016-03-16 VITALS — Resp 16 | Ht 65.0 in | Wt 200.0 lb

## 2016-03-16 DIAGNOSIS — M79672 Pain in left foot: Secondary | ICD-10-CM

## 2016-03-16 DIAGNOSIS — L600 Ingrowing nail: Secondary | ICD-10-CM | POA: Diagnosis not present

## 2016-03-16 DIAGNOSIS — M79671 Pain in right foot: Secondary | ICD-10-CM

## 2016-03-16 DIAGNOSIS — M722 Plantar fascial fibromatosis: Secondary | ICD-10-CM

## 2016-03-16 MED ORDER — TRIAMCINOLONE ACETONIDE 10 MG/ML IJ SUSP
10.0000 mg | Freq: Once | INTRAMUSCULAR | Status: AC
  Administered 2016-03-16: 10 mg

## 2016-03-16 NOTE — Patient Instructions (Signed)

## 2016-03-16 NOTE — Progress Notes (Signed)
   Subjective:    Patient ID: Claire Irwin, female    DOB: [birth year], 45 y.o.   MRN: [id number]  HPI Chief Complaint  Patient presents with  . Foot Pain    Bilateral; great toes radaitng to medial side; Left foot-heel; x2-3 months      Review of Systems  Cardiovascular: Positive for leg swelling.  Musculoskeletal: Positive for myalgias.  All other systems reviewed and are negative.      Objective:   Physical Exam        Assessment & Plan:

## 2016-03-18 ENCOUNTER — Telehealth: Payer: Self-pay | Admitting: *Deleted

## 2016-03-18 NOTE — Progress Notes (Signed)
Subjective:     Patient ID: Claire Irwin, female   DOB: 04/24/1971, 45 y.o.   MRN: [id number]  HPI patient presents stating she's had several months of pain in the left plantar heel and is also developed problems on the inside of the big toenails bilateral and is not sure if there ingrown   Review of Systems  All other systems reviewed and are negative.      Objective:   Physical Exam  Constitutional: She is oriented to person, place, and time.  Cardiovascular: Intact distal pulses.   Musculoskeletal: Normal range of motion.  Neurological: She is oriented to person, place, and time.  Skin: Skin is warm.  Nursing note and vitals reviewed.  neurovascular status found to be intact with muscle strength adequate range of motion within normal limits with patient noted to have severe discomfort plantar aspect left heel at the insertional point of the tendon into the calcaneus with moderate depression of the arch and is noted to have mild incurvation of the hallux nail borders hallux bilateral with patient found to have good digital perfusion and well oriented 3     Assessment:     Acute plantar fasciitis left with probable ingrown toenail deformity bilateral hallux    Plan:     H&P and conditions reviewed and at this time injected the left plantar fascia 3 mg Kenalog 5 mg Xylocaine and applied fascial brace gave instructions on physical therapy and will carefully trim the hallux nails and may need correction if they remained a problem  X-rays indicated spur formation with no indications of stress fracture or advanced arthritis

## 2016-03-18 NOTE — Telephone Encounter (Signed)
Pt states she received exercises after her visit but didn't know which ones to do.  Left message encouraging pt to try all of the stretching exercises, then divide and perform certain ones each day and through the day, and once she had longer periods of comfort through the day add the strengthening exercises.

## 2016-03-25 ENCOUNTER — Ambulatory Visit (INDEPENDENT_AMBULATORY_CARE_PROVIDER_SITE_OTHER): Payer: BC Managed Care – PPO | Admitting: Podiatry

## 2016-03-25 DIAGNOSIS — M722 Plantar fascial fibromatosis: Secondary | ICD-10-CM | POA: Diagnosis not present

## 2016-03-25 DIAGNOSIS — M79671 Pain in right foot: Secondary | ICD-10-CM

## 2016-03-25 DIAGNOSIS — M79672 Pain in left foot: Secondary | ICD-10-CM

## 2016-03-25 MED ORDER — TRIAMCINOLONE ACETONIDE 10 MG/ML IJ SUSP
10.0000 mg | Freq: Once | INTRAMUSCULAR | Status: AC
Start: 2016-03-25 — End: 2016-03-25
  Administered 2016-03-25: 10 mg

## 2016-03-25 NOTE — Progress Notes (Signed)
Subjective:     Patient ID: Claire Irwin, female   DOB: July 26, 1971, 45 y.o.   MRN: [id number]  HPI patient states that she still having pain in her left heel and it makes it hard for her to walk distances   Review of Systems     Objective:   Physical Exam Neurovascular status intact muscle strength adequate and continued discomfort in the left plantar fashion at the insertional point of the tendon into the calcaneus    Assessment:     Continued fasciitis left with inflammation fluid buildup    Plan:     Reinjected the plantar fascial left 3 mg Kenalog 5 mg Xylocaine advised on physical therapy and went ahead today and scanned for custom orthotics to reduce plantar pressures on the feet. Patient be seen back when ready and will not go barefoot until we can see the patient back to reevaluate

## 2016-03-26 ENCOUNTER — Telehealth: Payer: Self-pay | Admitting: Emergency Medicine

## 2016-03-26 MED ORDER — ALBUTEROL SULFATE HFA 108 (90 BASE) MCG/ACT IN AERS: INHALATION_SPRAY | RESPIRATORY_TRACT | 5 refills | Status: DC

## 2016-03-26 NOTE — Telephone Encounter (Signed)
Medication refill sent to pharmacy  

## 2016-03-26 NOTE — Telephone Encounter (Signed)
Pt needs a refill on PROAIR HFA 108 (90 BASE) MCG/ACT inhaler. Pharmacy is [facility] [address]. Please follow up thanks.

## 2016-04-15 ENCOUNTER — Ambulatory Visit: Payer: BC Managed Care – PPO | Admitting: *Deleted

## 2016-04-15 DIAGNOSIS — M722 Plantar fascial fibromatosis: Secondary | ICD-10-CM

## 2016-04-15 NOTE — Patient Instructions (Signed)

## 2016-04-15 NOTE — Progress Notes (Signed)
Patient ID: Claire Irwin, female   DOB: April 26, 1971, 45 y.o.   MRN: [id number]  Patient presents for orthotic pick up.  Verbal and written break in and wear instructions given.  Patient will follow up in 4 weeks if symptoms worsen or fail to improve.

## 2016-06-26 ENCOUNTER — Telehealth: Payer: Self-pay | Admitting: Podiatry

## 2016-06-26 NOTE — Telephone Encounter (Signed)
Left vm for pt to call office to schedule appt. Still having problems with orthotics.

## 2016-07-01 ENCOUNTER — Ambulatory Visit: Payer: BC Managed Care – PPO | Admitting: Internal Medicine

## 2016-07-01 ENCOUNTER — Ambulatory Visit (INDEPENDENT_AMBULATORY_CARE_PROVIDER_SITE_OTHER): Payer: BC Managed Care – PPO | Admitting: Internal Medicine

## 2016-07-01 ENCOUNTER — Encounter: Payer: Self-pay | Admitting: Internal Medicine

## 2016-07-01 VITALS — BP 136/72 | HR 100 | Temp 98.9°F | Resp 20 | Wt 201.5 lb

## 2016-07-01 DIAGNOSIS — R609 Edema, unspecified: Secondary | ICD-10-CM

## 2016-07-01 DIAGNOSIS — R5383 Other fatigue: Secondary | ICD-10-CM | POA: Insufficient documentation

## 2016-07-01 DIAGNOSIS — F411 Generalized anxiety disorder: Secondary | ICD-10-CM | POA: Diagnosis not present

## 2016-07-01 DIAGNOSIS — E119 Type 2 diabetes mellitus without complications: Secondary | ICD-10-CM | POA: Diagnosis not present

## 2016-07-01 DIAGNOSIS — Z23 Encounter for immunization: Secondary | ICD-10-CM | POA: Diagnosis not present

## 2016-07-01 DIAGNOSIS — Z0001 Encounter for general adult medical examination with abnormal findings: Secondary | ICD-10-CM

## 2016-07-01 MED ORDER — METFORMIN HCL ER 500 MG PO TB24: 500.0000 mg | ORAL_TABLET | Freq: Every day | ORAL | Status: DC

## 2016-07-01 MED ORDER — CLONAZEPAM 0.5 MG PO TABS: 0.5000 mg | ORAL_TABLET | Freq: Two times a day (BID) | ORAL | 1 refills | Status: DC | PRN

## 2016-07-01 MED ORDER — ASPIRIN EC 81 MG PO TBEC: 81.0000 mg | DELAYED_RELEASE_TABLET | Freq: Every day | ORAL | 11 refills | Status: AC

## 2016-07-01 MED ORDER — METFORMIN HCL ER 500 MG PO TB24: 500.0000 mg | ORAL_TABLET | Freq: Every day | ORAL | 3 refills | Status: DC

## 2016-07-01 MED ORDER — LANCETS MISC: 11 refills | Status: DC

## 2016-07-01 MED ORDER — GLUCOSE BLOOD VI STRP: ORAL_STRIP | 12 refills | Status: DC

## 2016-07-01 MED ORDER — HYDROCHLOROTHIAZIDE 25 MG PO TABS: 12.5000 mg | ORAL_TABLET | Freq: Every day | ORAL | 3 refills | Status: DC

## 2016-07-01 MED ORDER — ONETOUCH ULTRA 2 W/DEVICE KIT: PACK | 0 refills | Status: DC

## 2016-07-01 MED ORDER — POTASSIUM CHLORIDE ER 8 MEQ PO TBCR: 8.0000 meq | EXTENDED_RELEASE_TABLET | Freq: Every day | ORAL | 3 refills | Status: DC

## 2016-07-01 NOTE — Progress Notes (Signed)
Pre visit review using our clinic review tool, if applicable. No additional management support is needed unless otherwise documented below in the visit note. 

## 2016-07-01 NOTE — Progress Notes (Signed)
   Subjective:    Patient ID: Claire Irwin, female    DOB: Oct 17, 1970, 45 y.o.   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  Also Does have several wks ongoing nasal allergy symptoms with clearish congestion, itch and sneezing, without fever, pain, ST, cough, swelling or wheezing.  Denies worsening reflux, abd pain, dysphagia, n/v, or blood, but miralax works one day, but next day stomach seems irritated.    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  Also separated from husband dec 2016, little chance of reconciliation.  Has 2 twin children 68yo.    Denies worsening depressive symptoms, suicidal ideation, or panic;  Has never slep well, but not really worse. Appetite has been less at times, but wt no significant change. Has been tested for osa but neg, last done about 7 yrs ago.   No new recent meds changes. Last tsh normal.  No menses due to s/p TAH, no recent hx of anemia or overt blood loss.    Quite concerned about her risk of DM due to [place], asks for a1c today  Pt denies chest pain, orthopnea, PND, increased LE swelling, palpitations, dizziness or syncope, but has mild intermittent asthma symptoms, used more than twice per wk about 3 wks ago but seemed to improved..    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   Gastrointestinal: Negative for distension or worsening eructation Genitourinary: Negative for retention or change in urine volume.  Musculoskeletal: Negative for other MSK pain or swelling Skin: Negative for color change and worsening wound Neurological: Negative for tremors and numbness other than noted  Psychiatric/Behavioral: Negative for decreased concentration or agitation other than above   All other system neg per pt    Objective:   Physical Exam BP 136/72   Pulse 100   Temp 98.9 F (37.2 C) (Oral)   Resp 20   Wt 201 lb 8 oz (91.4 kg)   SpO2 98%   BMI 33.53 kg/m  VS noted,  Constitutional: Pt appears in no apparent distress HENT: Head: NCAT.  Right Ear: External ear normal.  Left Ear: External ear normal.  Eyes: . Pupils are equal, round, and reactive to light. Conjunctivae and EOM are normal Neck: Normal range of motion. Neck supple.  Cardiovascular: Normal rate and regular rhythm.   Pulmonary/Chest: Effort normal and breath sounds without rales or wheezing.  Abd:  Soft, NT, ND, + BS Neurological: Pt is alert. Not confused , motor grossly intact Skin: Skin is warm. No rash Psychiatric: Pt behavior is normal. No agitation. mod nervous Trace pedal edema bilat  POCT - Hgba1c today - 7.1      Assessment & Plan:

## 2016-07-01 NOTE — Patient Instructions (Addendum)
You had the flu shot today  OK to reduce the HCT fluid pill to Half pill per day, and potassium to 8 meq per day  Please take all new medication as prescribed  - klonopin as needed, and the metformin for sugar, and the Aspirin at 81 mg per day  Your A1c was 7.1 today, so there appears to be evidence for new Diabetes  We have sent prescriptions for glucometer, strips and lancets to your pharmacy to use once in the AM dailly  Please continue all other medications as before, and refills have been done if requested.  Please have the pharmacy call with any other refills you may need.  Please continue your efforts at being more active, low cholesterol diet, and weight control.  Please keep your appointments with your specialists as you may have planned  You will be contacted regarding the referral for: Diabetes Education  Please return in 6 months, or sooner if needed, with Lab testing done 3-5 days before

## 2016-07-01 NOTE — Assessment & Plan Note (Signed)
Ok for decreased hct toi 12.5 with reduce Kdur to klor cont 8 qd,

## 2016-07-06 NOTE — Assessment & Plan Note (Addendum)
New onset, Mild uncontrolled, to start metformin asd, DM diet, wt control, DM education, o/w stable overall by history and exam, recent data reviewed with pt, and pt to continue medical treatment as before,  to f/u any worsening symptoms or concerns No results found for: HGBA1C - POCT a1c 7.1 today

## 2016-07-06 NOTE — Assessment & Plan Note (Signed)
Etiology unclear, Exam otherwise benign, to check labs as documented next vistit , o/w follow with expectant management

## 2016-07-06 NOTE — Assessment & Plan Note (Addendum)
Mild uncontrolled, recent data reviewed with pt, and pt to continue medical treatment as before,  to f/u any worsening symptoms or concerns, for klonopin asd,  to f/u any worsening symptoms or concerns  Lab Results  Component Value Date   WBC 7.5 02/06/2016   HGB 15.0 02/06/2016   HCT 44.4 02/06/2016   PLT 302.0 02/06/2016   GLUCOSE 110 (H) 02/06/2016   CHOL 167 02/06/2016   TRIG 92.0 02/06/2016   HDL 50.20 02/06/2016   LDLCALC 99 02/06/2016   ALT 24 02/06/2016   AST 21 02/06/2016   NA 139 02/06/2016   K 3.9 02/06/2016   CL 103 02/06/2016   CREATININE 1.13 02/06/2016   BUN 8 02/06/2016   CO2 27 02/06/2016   TSH 1.20 02/06/2016

## 2016-07-10 ENCOUNTER — Encounter: Payer: Self-pay | Admitting: Podiatry

## 2016-07-10 ENCOUNTER — Ambulatory Visit (INDEPENDENT_AMBULATORY_CARE_PROVIDER_SITE_OTHER): Payer: BC Managed Care – PPO | Admitting: Podiatry

## 2016-07-10 DIAGNOSIS — M722 Plantar fascial fibromatosis: Secondary | ICD-10-CM | POA: Diagnosis not present

## 2016-07-10 DIAGNOSIS — B070 Plantar wart: Secondary | ICD-10-CM

## 2016-07-10 MED ORDER — TRIAMCINOLONE ACETONIDE 10 MG/ML IJ SUSP
10.0000 mg | Freq: Once | INTRAMUSCULAR | Status: AC
  Administered 2016-07-10: 10 mg

## 2016-07-11 NOTE — Progress Notes (Signed)
Subjective:     Patient ID: Claire Irwin, female   DOB: 1971/04/16, 45 y.o.   MRN: [id number]  HPI patient presents stating that she is having a lot of pain in her left heel that has been intensifying over the last few months. States she did have a period of relief and the orthotics help but still has pain   Review of Systems     Objective:   Physical Exam Neurovascular status intact with pain in the plantar aspect of the left heel at the insertional point tendon into the calcaneus with inflammation fluid buildup noted around the medial band    Assessment:     Acute plantar fasciitis left with inflammation fluid buildup    Plan:     Instructed the importance of chronic care for condition and not going barefoot and continuing orthotic usage and reinjected the plantar fascial left 3 mg Kenalog 5 mg Xylocaine. I then dispensed night splint with all instructions on usage

## 2016-07-26 NOTE — Progress Notes (Signed)
Subjective:    Patient ID: Claire Irwin, female    DOB: Jan 26, 1971, 45 y.o.   MRN: [id number]  HPI pt is referred by Dr Jenny Reichmann, for diabetes.  Pt states DM was dx'ed 1 month ago; she has mild if any neuropathy of the lower extremities; she is unaware of any associated chronic complications; she has never been on insulin; pt says her diet and exercise are not good; she has never had GDM, pancreatitis, severe hypoglycemia or DKA.   She was rx'ed metformin.   Past Medical History:  Diagnosis Date  . Allergy   . Asthma   . 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 Fibrocystic breast disease   . GERD (gastroesophageal reflux disease)   . History of endometriosis   . Hyperlipidemia 06/29/2011  . Preeclampsia     Past Surgical History:  Procedure Laterality Date  . ABDOMINAL ADHESION SURGERY  07/2004  . CESAREAN SECTION  02/2004  . DILATION AND CURETTAGE, DIAGNOSTIC / THERAPEUTIC      Social History   Social History  . Marital status: Legally Separated    Spouse name: N/A  . Number of children: N/A  . Years of education: N/A   Occupational History  . Not on file.   Social History Main Topics  . Smoking status: Never Smoker  . Smokeless tobacco: Not on file  . Alcohol use No  . Drug use: No  . Sexual activity: Not on file   Other Topics Concern  . Not on file   Social History Narrative  . No narrative on file    Current Outpatient Prescriptions on File Prior to Visit  Medication Sig Dispense Refill  . albuterol (PROAIR HFA) 108 (90 Base) MCG/ACT inhaler USE 2 PUFFS 4 TIMES DAILY AS NEEDED 8.5 each 5  . aspirin EC 81 MG tablet Take 1 tablet (81 mg total) by mouth daily. 90 tablet 11  . clonazePAM (KLONOPIN) 0.5 MG tablet Take 1 tablet (0.5 mg total) by mouth 2 (two) times daily as needed for anxiety. 60 tablet 1  . cyclobenzaprine (FLEXERIL) 5 MG tablet TAKE 1 TABLET (5 MG TOTAL) BY MOUTH 3 (THREE) TIMES DAILY AS NEEDED FOR MUSCLE SPASMS. 60 tablet 0  .  Fluticasone-Salmeterol (ADVAIR DISKUS) 250-50 MCG/DOSE AEPB USE 1 PUFF TWO TIMES DAILY AS NEEDED 60 each 11  . hydrochlorothiazide (HYDRODIURIL) 25 MG tablet Take 0.5 tablets (12.5 mg total) by mouth daily. (Patient taking differently: Take 12.5 mg by mouth as needed. ) 45 tablet 3  . Lancets MISC Use as directed one per day 100 each 11  . metFORMIN (GLUCOPHAGE-XR) 500 MG 24 hr tablet Take 1 tablet (500 mg total) by mouth daily with breakfast. 90 tablet 3  . NORETHINDRONE ACETATE PO Take by mouth.      . pantoprazole (PROTONIX) 40 MG tablet Take 1 tablet (40 mg total) by mouth daily. (Patient taking differently: Take 40 mg by mouth as needed. ) 90 tablet 3  . potassium chloride (KLOR-CON) 8 MEQ tablet Take 1 tablet (8 mEq total) by mouth daily. (Patient taking differently: Take 8 mEq by mouth as needed. ) 90 tablet 3   Current Facility-Administered Medications on File Prior to Visit  Medication Dose Route Frequency Provider Last Rate Last Dose  . metFORMIN (GLUCOPHAGE-XR) 24 hr tablet 500 mg  500 mg Oral Q breakfast Biagio Borg, MD        Allergies  Allergen Reactions  . Hydrocodone   .  Sulfonamide Derivatives     Family History  Problem Relation Age of Onset  . Diabetes Mother   . Hypertension Mother   . Asthma Mother   . Allergies Mother   . Diabetes Father   . Heart disease Paternal Uncle   . Asthma Maternal Grandmother   . Allergies Maternal Grandmother   . Cancer Paternal Grandmother     Brain and Lung    BP 122/84   Pulse 90   Wt 201 lb (91.2 kg)   SpO2 98%   BMI 33.45 kg/m    Review of Systems denies weight loss, blurry vision, chest pain, sob, n/v, urinary frequency, muscle cramps, excessive diaphoresis, depression, cold intolerance, rhinorrhea, and easy bruising. She has fatigue and intermittent headache.     Objective:   Physical Exam VS: see vs page GEN: no distress HEAD: head: no deformity eyes: no periorbital swelling, no proptosis external nose and  ears are normal mouth: no lesion seen NECK: supple, thyroid is not enlarged CHEST WALL: no deformity LUNGS: clear to auscultation CV: reg rate and rhythm, no murmur ABD: abdomen is soft, nontender.  no hepatosplenomegaly.  not distended.  no hernia MUSCULOSKELETAL: muscle bulk and strength are grossly normal.  no obvious joint swelling.  gait is normal and steady EXTEMITIES: no deformity.  no ulcer on the feet.  feet are of normal color and temp.  no edema PULSES: dorsalis pedis intact bilat.  no carotid bruit NEURO:  cn 2-12 grossly intact.   readily moves all 4's.  sensation is intact to touch on the feet SKIN:  Normal texture and temperature.  No rash or suspicious lesion is visible.   NODES:  None palpable at the neck PSYCH: alert, well-oriented.  Does not appear anxious nor depressed.   i personally reviewed electrocardiogram tracing (12/27/13):  Indication: wellness.  Impression: Low voltage.   Lab Results  Component Value Date   CREATININE 1.13 02/06/2016   BUN 8 02/06/2016   NA 139 02/06/2016   K 3.9 02/06/2016   CL 103 02/06/2016   CO2 27 02/06/2016   outside test results are reviewed: A1c=7.1%  Lab Results  Component Value Date   TSH 1.20 02/06/2016      Assessment & Plan:  Type 2 DM: prob better on metformin.  We'll check fructosamine.  Obesity: she declines surgery.  Patient is advised the following: Patient Instructions  good diet and exercise significantly improve the control of your diabetes.  please let me know if you wish to be referred to a dietician.  high blood sugar is very risky to your health.  you should see an eye doctor and dentist every year.  It is very important to get all recommended vaccinations.  Controlling your blood pressure and cholesterol drastically reduces the damage diabetes does to your body.  Those who smoke should quit.  Please discuss these with your doctor.  check your blood sugar once a day.  vary the time of day when you check,  between before the 3 meals, and at bedtime.  also check if you have symptoms of your blood sugar being too high or too low.  please keep a record of the readings and bring it to your next appointment here (or you can bring the meter itself).  You can write it on any piece of paper.  please call us sooner if your blood sugar goes below 70, or if you have a lot of readings over 200.   Here is a meter.  I have sent a prescription to your pharmacy, for strips.   blood tests are requested for you today.  We'll let you know about the results. Please come back for a follow-up appointment in 3 months.

## 2016-07-29 ENCOUNTER — Ambulatory Visit (INDEPENDENT_AMBULATORY_CARE_PROVIDER_SITE_OTHER): Payer: BC Managed Care – PPO | Admitting: Endocrinology

## 2016-07-29 VITALS — BP 122/84 | HR 90 | Wt 201.0 lb

## 2016-07-29 DIAGNOSIS — E119 Type 2 diabetes mellitus without complications: Secondary | ICD-10-CM | POA: Diagnosis not present

## 2016-07-29 MED ORDER — GLUCOSE BLOOD VI STRP: 1.0000 | ORAL_STRIP | Freq: Every day | 3 refills | Status: DC

## 2016-07-29 NOTE — Patient Instructions (Addendum)
good diet and exercise significantly improve the control of your diabetes.  please let me know if you wish to be referred to a dietician.  high blood sugar is very risky to your health.  you should see an eye doctor and dentist every year.  It is very important to get all recommended vaccinations.  Controlling your blood pressure and cholesterol drastically reduces the damage diabetes does to your body.  Those who smoke should quit.  Please discuss these with your doctor.  check your blood sugar once a day.  vary the time of day when you check, between before the 3 meals, and at bedtime.  also check if you have symptoms of your blood sugar being too high or too low.  please keep a record of the readings and bring it to your next appointment here (or you can bring the meter itself).  You can write it on any piece of paper.  please call us sooner if your blood sugar goes below 70, or if you have a lot of readings over 200.   Here is a meter.  I have sent a prescription to your pharmacy, for strips.   blood tests are requested for you today.  We'll let you know about the results. Please come back for a follow-up appointment in 3 months.

## 2016-07-31 LAB — FRUCTOSAMINE: Fructosamine: 286 umol/L — ABNORMAL HIGH (ref 190–270)

## 2016-08-07 ENCOUNTER — Ambulatory Visit: Payer: BC Managed Care – PPO | Admitting: Podiatry

## 2016-08-07 ENCOUNTER — Encounter: Payer: Self-pay | Admitting: Dietician

## 2016-08-07 ENCOUNTER — Encounter: Payer: BC Managed Care – PPO | Attending: Endocrinology | Admitting: Dietician

## 2016-08-07 ENCOUNTER — Ambulatory Visit (INDEPENDENT_AMBULATORY_CARE_PROVIDER_SITE_OTHER): Payer: BC Managed Care – PPO | Admitting: Podiatry

## 2016-08-07 ENCOUNTER — Encounter: Payer: Self-pay | Admitting: Podiatry

## 2016-08-07 DIAGNOSIS — Z6834 Body mass index (BMI) 34.0-34.9, adult: Secondary | ICD-10-CM | POA: Insufficient documentation

## 2016-08-07 DIAGNOSIS — M722 Plantar fascial fibromatosis: Secondary | ICD-10-CM

## 2016-08-07 DIAGNOSIS — Z713 Dietary counseling and surveillance: Secondary | ICD-10-CM | POA: Insufficient documentation

## 2016-08-07 DIAGNOSIS — E119 Type 2 diabetes mellitus without complications: Secondary | ICD-10-CM | POA: Diagnosis present

## 2016-08-07 DIAGNOSIS — B070 Plantar wart: Secondary | ICD-10-CM

## 2016-08-07 NOTE — Patient Instructions (Signed)
Consider 2000 units vitamin D daily. Start an active lifestyle.  Start with 10 minutes every day and increase to 30 minutes most days. LUVO frozen meals for example are low in sodium. Rethink your beverages.  Aim for 2-3 Carb Choices per meal (30-45 grams) +/- 1 either way  Aim for 0-1 Carbs per snack if hungry  Include protein in moderation with your meals and snacks Consider reading food labels for Total Carbohydrate and Fat Grams of foods Consider checking BG at alternate times per day as directed by MD  Consider taking medication as directed by MD

## 2016-08-07 NOTE — Progress Notes (Signed)
Diabetes Self-Management Education  Visit Type: First/Initial  Appt. Start Time: 0800 Appt. End Time: 0930  08/07/2016  [name], identified by name and date of birth, is a 45 y.o. female with a diagnosis of newly diagnosed Diabetes: Type 2.  Other hx includes GERD and asthma.  Fructosamine 286 07/29/16 and A1C 7.1% 07/01/16.  She states that she wants to lose weight and avoid bariatric surgery.  She lost to 150 lbs 10 years ago with a program called "[facility]" but did not maintain it.  Patient lives with 64 yo twin daughters.  She works as a [employer] about 10 hours per day, separated from her husband this year and is trying to find balance in her life.  Her daughters have started to make changes to eat healthier and refuse to eat fast food so will often eat a frozen meal.  Patient always eats take out fast food for dinner.  She states that her job has increased stress and is considering a change after this year.  She used to skip breakfast but has started to include this again since diabetes diagnosis.  She dislikes most vegetables except for salads.  She states that a good friend went on a vegan diet, lost weight and decreased her A1C from more than 10 to below 5 but Itha does not feel ready for this change although her friend will share recipes.  ASSESSMENT  Height 5' 4.5" (1.638 m), weight 202 lb (91.6 kg). Body mass index is 34.14 kg/m.      Diabetes Self-Management Education - 08/07/16 0817      Visit Information   Visit Type First/Initial     Initial Visit   Diabetes Type Type 2   Are you currently following a meal plan? No   Are you taking your medications as prescribed? Yes   Date Diagnosed 07/2016     Health Coping   How would you rate your overall health? Good     Psychosocial Assessment   Patient Belief/Attitude about Diabetes Motivated to manage diabetes   Self-care barriers None   Self-management support Doctor's office;Family   Other persons present Patient   Patient Concerns Nutrition/Meal planning;Weight Control;Glycemic Control   Special Needs None   Preferred Learning Style No preference indicated   Learning Readiness Ready   How often do you need to have someone help you when you read instructions, pamphlets, or other written materials from your doctor or pharmacy? 1 - Never   What is the last grade level you completed in school? 4 years college     Pre-Education Assessment   Patient understands the diabetes disease and treatment process. Needs Instruction   Patient understands incorporating nutritional management into lifestyle. Needs Instruction   Patient undertands incorporating physical activity into lifestyle. Needs Instruction   Patient understands using medications safely. Needs Instruction   Patient understands monitoring blood glucose, interpreting and using results Needs Instruction   Patient understands prevention, detection, and treatment of acute complications. Needs Instruction   Patient understands prevention, detection, and treatment of chronic complications. Needs Instruction   Patient understands how to develop strategies to address psychosocial issues. Needs Instruction   Patient understands how to develop strategies to promote health/change behavior. Needs Instruction     Complications   Last HgB A1C per patient/outside source 7.1 %  07/01/16   How often do you check your blood sugar? 0 times/day (not testing)  was testing daily but now only if sick or concerned about  blood sugar   Fasting Blood glucose range (mg/dL) 130-179;180-200   Number of hypoglycemic episodes per month 0   Number of hyperglycemic episodes per week 0   Have you had a dilated eye exam in the past 12 months? No   Have you had a dental exam in the past 12 months? Yes   Are you checking your feet? Yes   How many days per week are you checking your feet? 7     Dietary Intake   Breakfast kind bar or yogurt or  applesauce or NABS   Snack (morning) none   Lunch TV dinner OR sandwich, chips, fruit or yogurt    Snack (afternoon) none   Dinner fast food (sandwich and fries OR chicken, slaw, green beans   Snack (evening) only if stressed or "starving" chips, fruit, yogurt, cookies   Beverage(s) water, green coke (cane sugar and stevia)     Exercise   Exercise Type ADL's  has flexbands, DVD's (beginning yoga, walk a mile)   How many days per week to you exercise? 0   How many minutes per day do you exercise? 0   Total minutes per week of exercise 0     Patient Education   Previous Diabetes Education No   Disease state  Definition of diabetes, type 1 and 2, and the diagnosis of diabetes;Factors that contribute to the development of diabetes;Explored patient's options for treatment of their diabetes   Nutrition management  Role of diet in the treatment of diabetes and the relationship between the three main macronutrients and blood glucose level;Food label reading, portion sizes and measuring food.;Information on hints to eating out and maintain blood glucose control.;Meal options for control of blood glucose level and chronic complications.   Physical activity and exercise  Role of exercise on diabetes management, blood pressure control and cardiac health.   Medications Reviewed patients medication for diabetes, action, purpose, timing of dose and side effects.   Monitoring Purpose and frequency of SMBG.;Yearly dilated eye exam;Daily foot exams;Identified appropriate SMBG and/or A1C goals.   Chronic complications Assessed and discussed foot care and prevention of foot problems;Retinopathy and reason for yearly dilated eye exams;Dental care;Relationship between chronic complications and blood glucose control   Psychosocial adjustment Worked with patient to identify barriers to care and solutions;Role of stress on diabetes;Identified and addressed patients feelings and concerns about diabetes      Individualized Goals (developed by patient)   Nutrition General guidelines for healthy choices and portions discussed;Follow meal plan discussed   Physical Activity Exercise 5-7 days per week;30 minutes per day   Medications take my medication as prescribed   Monitoring  test my blood glucose as discussed   Reducing Risk examine blood glucose patterns;do foot checks daily   Health Coping discuss diabetes with (comment)  MD/RD     Post-Education Assessment   Patient understands the diabetes disease and treatment process. Demonstrates understanding / competency   Patient understands incorporating nutritional management into lifestyle. Demonstrates understanding / competency   Patient undertands incorporating physical activity into lifestyle. Demonstrates understanding / competency   Patient understands using medications safely. Demonstrates understanding / competency   Patient understands monitoring blood glucose, interpreting and using results Demonstrates understanding / competency   Patient understands prevention, detection, and treatment of acute complications. Demonstrates understanding / competency   Patient understands prevention, detection, and treatment of chronic complications. Demonstrates understanding / competency   Patient understands how to develop strategies to address psychosocial issues. Demonstrates understanding / competency  Patient understands how to develop strategies to promote health/change behavior. Demonstrates understanding / competency     Outcomes   Expected Outcomes Demonstrated interest in learning. Expect positive outcomes   Future DMSE PRN   Program Status Completed      Individualized Plan for Diabetes Self-Management Training:   Learning Objective:  Patient will have a greater understanding of diabetes self-management. Patient education plan is to attend individual and/or group sessions per assessed needs and concerns.   Plan:   Patient  Instructions  Consider 2000 units vitamin D daily. Start an active lifestyle.  Start with 10 minutes every day and increase to 30 minutes most days. LUVO frozen meals for example are low in sodium. Rethink your beverages.  Aim for 2-3 Carb Choices per meal (30-45 grams) +/- 1 either way  Aim for 0-1 Carbs per snack if hungry  Include protein in moderation with your meals and snacks Consider reading food labels for Total Carbohydrate and Fat Grams of foods Consider checking BG at alternate times per day as directed by MD  Consider taking medication as directed by MD    Expected Outcomes:  Demonstrated interest in learning. Expect positive outcomes  Education material provided: Living Well with Diabetes, Food label handouts, A1C conversion sheet, Meal plan card, My Plate, Snack sheet and Support group flyer, Breakfast ideas, Guide to Weight Management Magazine  If problems or questions, patient to contact team via:  Phone and Email  Future DSME appointment: PRN

## 2016-08-09 NOTE — Progress Notes (Signed)
Subjective:     Patient ID: Claire Irwin, female   DOB: [birth year], 45 y.o.   MRN: [id number]  HPI patient presents stating that the heel is feeling some better but still sore and she also has a lesion underneath the heel that can be bothersome for her and that needs to be treated   Review of Systems     Objective:   Physical Exam Neurovascular status intact with discomfort in the plantar heel improving left with an area on the plantar heel measuring about 1 cm x 1 cm with pinpoint bleeding upon debridement and pain to lateral pressure    Assessment:     2 separate conditions with plantar fasciitis resolving with treatment and probable plantar wart left    Plan:     H&P both conditions reviewed and at this time using sterile instrumentation debridement of lesion occurred left and I did apply chemical agent to create an immune response. Applied padding and gave instructions on what to do if it should blister at all and patient be seen back 4 weeks or earlier if needed may require excision or other treatment depending on response

## 2016-08-10 MED ORDER — NONFORMULARY OR COMPOUNDED ITEM: 2 refills | Status: DC

## 2016-08-10 NOTE — Addendum Note (Signed)
Addended by: Harriett Sine D on: 08/10/2016 08:53 AM   Modules accepted: Orders

## 2016-09-11 ENCOUNTER — Encounter: Payer: Self-pay | Admitting: Podiatry

## 2016-09-11 ENCOUNTER — Ambulatory Visit (INDEPENDENT_AMBULATORY_CARE_PROVIDER_SITE_OTHER): Payer: BC Managed Care – PPO | Admitting: Podiatry

## 2016-09-11 VITALS — BP 110/76 | HR 93 | Resp 16

## 2016-09-11 DIAGNOSIS — M722 Plantar fascial fibromatosis: Secondary | ICD-10-CM

## 2016-09-11 DIAGNOSIS — B070 Plantar wart: Secondary | ICD-10-CM | POA: Diagnosis not present

## 2016-09-14 NOTE — Progress Notes (Signed)
Subjective:     Patient ID: Claire Irwin, female   DOB: 05-Oct-1970, 46 y.o.   MRN: [id number]  HPI patient states it feels a lot better with lesion underneath the left foot and heel that still present but has reduced in its size and pain   Review of Systems     Objective:   Physical Exam Neurovascular status intact negative Homans sign noted with patient's plantar lesion left heel upon debridement show pinpoint bleeding with pain to lateral pressure    Assessment:     Verruca plantaris plantar left improved with plantar fasciitis left doing better    Plan:     Reviewed plantar fasciitis and recommended continuation of supportive shoe gear therapy and padding. For the lesion I debrided fully I applied immune agent to create response and applied sterile dressing

## 2016-10-28 ENCOUNTER — Ambulatory Visit (INDEPENDENT_AMBULATORY_CARE_PROVIDER_SITE_OTHER): Payer: BC Managed Care – PPO | Admitting: Endocrinology

## 2016-10-28 ENCOUNTER — Encounter: Payer: Self-pay | Admitting: Endocrinology

## 2016-10-28 VITALS — BP 122/86 | HR 94 | Ht 64.5 in | Wt 198.0 lb

## 2016-10-28 DIAGNOSIS — E119 Type 2 diabetes mellitus without complications: Secondary | ICD-10-CM

## 2016-10-28 LAB — POCT GLYCOSYLATED HEMOGLOBIN (HGB A1C): Hemoglobin A1C: 6.4

## 2016-10-28 MED ORDER — DAPAGLIFLOZIN PROPANEDIOL 5 MG PO TABS: 5.0000 mg | ORAL_TABLET | Freq: Every day | ORAL | 11 refills | Status: DC

## 2016-10-28 NOTE — Patient Instructions (Addendum)
check your blood sugar once a day.  vary the time of day when you check, between before the 3 meals, and at bedtime.  also check if you have symptoms of your blood sugar being too high or too low.  please keep a record of the readings and bring it to your next appointment here (or you can bring the meter itself).  You can write it on any piece of paper.  please call us sooner if your blood sugar goes below 70, or if you have a lot of readings over 200.  I have sent a prescription to your pharmacy, to add "farxiga." Please come back for a follow-up appointment in 3 months.   

## 2016-10-28 NOTE — Progress Notes (Signed)
Subjective:    Patient ID: Claire Irwin, female    DOB: October 30, 1970, 46 y.o.   MRN: [id number]  HPI Pt returns for f/u of diabetes mellitus: DM type: 2 Dx'ed: 0000000 Complications: none Therapy: metformin GDM: never DKA: never Severe hypoglycemia: never.  Pancreatitis: never.  Other: she has never taken insulin.  Interval history: pt states she feels well in general.  She takes metformin as rx'ed.  Past Medical History:  Diagnosis Date  . Allergy   . Asthma   . Diabetes mellitus without complication ([place])   . 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 Fibrocystic breast disease   . GERD (gastroesophageal reflux disease)   . History of endometriosis   . Hyperlipidemia 06/29/2011  . Preeclampsia     Past Surgical History:  Procedure Laterality Date  . ABDOMINAL ADHESION SURGERY  07/2004  . CESAREAN SECTION  02/2004  . DILATION AND CURETTAGE, DIAGNOSTIC / THERAPEUTIC      Social History   Social History  . Marital status: Legally Separated    Spouse name: N/A  . Number of children: N/A  . Years of education: N/A   Occupational History  . Not on file.   Social History Main Topics  . Smoking status: Never Smoker  . Smokeless tobacco: Never Used  . Alcohol use No  . Drug use: No  . Sexual activity: Not on file   Other Topics Concern  . Not on file   Social History Narrative  . No narrative on file    Current Outpatient Prescriptions on File Prior to Visit  Medication Sig Dispense Refill  . albuterol (PROAIR HFA) 108 (90 Base) MCG/ACT inhaler USE 2 PUFFS 4 TIMES DAILY AS NEEDED 8.5 each 5  . aspirin EC 81 MG tablet Take 1 tablet (81 mg total) by mouth daily. 90 tablet 11  . clonazePAM (KLONOPIN) 0.5 MG tablet Take 1 tablet (0.5 mg total) by mouth 2 (two) times daily as needed for anxiety. 60 tablet 1  . cyclobenzaprine (FLEXERIL) 5 MG tablet TAKE 1 TABLET (5 MG TOTAL) BY MOUTH 3 (THREE) TIMES DAILY AS NEEDED FOR MUSCLE SPASMS. 60 tablet 0  .  Fluticasone-Salmeterol (ADVAIR DISKUS) 250-50 MCG/DOSE AEPB USE 1 PUFF TWO TIMES DAILY AS NEEDED 60 each 11  . glucose blood (ONETOUCH VERIO) test strip 1 each by Other route daily. And lancets 1/day 100 each 3  . hydrochlorothiazide (HYDRODIURIL) 25 MG tablet Take 0.5 tablets (12.5 mg total) by mouth daily. (Patient taking differently: Take 12.5 mg by mouth as needed. ) 45 tablet 3  . Lancets MISC Use as directed one per day 100 each 11  . metFORMIN (GLUCOPHAGE-XR) 500 MG 24 hr tablet Take 1 tablet (500 mg total) by mouth daily with breakfast. 90 tablet 3  . NONFORMULARY OR COMPOUNDED ITEM [facility]:  Wart Cream - Cimetidine 2%, Deoxy-D-Glucose 0.2%, Fluorouracil-5 5%, Salicylic Acid 0000000, apply to affected areas daily. 120 each 2  . NORETHINDRONE ACETATE PO Take by mouth.      . pantoprazole (PROTONIX) 40 MG tablet Take 1 tablet (40 mg total) by mouth daily. 90 tablet 3  . Pediatric Multivit-Minerals-C ([facility] GUMMY MULTIVITAMIN KIDS) CHEW Chew 2 each by mouth.    . potassium chloride (KLOR-CON) 8 MEQ tablet Take 1 tablet (8 mEq total) by mouth daily. (Patient taking differently: Take 8 mEq by mouth as needed. ) 90 tablet 3   Current Facility-Administered Medications on File Prior to Visit  Medication  Dose Route Frequency Provider Last Rate Last Dose  . metFORMIN (GLUCOPHAGE-XR) 24 hr tablet 500 mg  500 mg Oral Q breakfast Biagio Borg, MD        Allergies  Allergen Reactions  . Hydrocodone   . Sulfonamide Derivatives     Family History  Problem Relation Age of Onset  . Diabetes Mother   . Hypertension Mother   . Asthma Mother   . Allergies Mother   . Diabetes Father   . Heart disease Paternal Uncle   . Asthma Maternal Grandmother   . Allergies Maternal Grandmother   . Cancer Paternal Grandmother     Brain and Lung    BP 122/86   Pulse 94   Ht 5' 4.5" (1.638 m)   Wt 198 lb (89.8 kg)   SpO2 98%   BMI 33.46 kg/m    Review of Systems She denies hypoglycemia.       Objective:   Physical Exam VITAL SIGNS:  See vs page GENERAL: no distress Pulses: dorsalis pedis intact bilat.   MSK: no deformity of the feet CV: no leg edema Skin:  no ulcer on the feet.  normal temp on the feet.  There is patchy hyperpigmentation of the ankles.   Neuro: sensation is intact to touch on the feet.   Lab Results  Component Value Date   HGBA1C 6.4 10/28/2016      Assessment & Plan:  Type 2 DM: she needs increased rx, if it can be done with a regimen that avoids or minimizes hypoglycemia.   Patient is advised the following: Patient Instructions  check your blood sugar once a day.  vary the time of day when you check, between before the 3 meals, and at bedtime.  also check if you have symptoms of your blood sugar being too high or too low.  please keep a record of the readings and bring it to your next appointment here (or you can bring the meter itself).  You can write it on any piece of paper.  please call us sooner if your blood sugar goes below 70, or if you have a lot of readings over 200.   I have sent a prescription to your pharmacy, to add "farxiga."   Please come back for a follow-up appointment in 3 months.

## 2016-11-25 ENCOUNTER — Telehealth: Payer: Self-pay | Admitting: Endocrinology

## 2016-11-25 NOTE — Telephone Encounter (Signed)
Patient scheduled for tomorrow at 330 pm. 

## 2016-11-25 NOTE — Telephone Encounter (Signed)
I would really like to see this. Please advise ov tomorrow.

## 2016-11-25 NOTE — Telephone Encounter (Signed)
Patient stated that he face is extremely dry,and has red patches and burns when she put something like lotions on it. Please advise  She thinks it could be the medication she is taking.

## 2016-11-25 NOTE — Telephone Encounter (Signed)
See message and please advise, Thanks!  

## 2016-11-26 ENCOUNTER — Ambulatory Visit (INDEPENDENT_AMBULATORY_CARE_PROVIDER_SITE_OTHER): Payer: BC Managed Care – PPO | Admitting: Endocrinology

## 2016-11-26 ENCOUNTER — Encounter: Payer: Self-pay | Admitting: Endocrinology

## 2016-11-26 VITALS — BP 132/84 | HR 96 | Ht 64.5 in | Wt 195.0 lb

## 2016-11-26 DIAGNOSIS — R21 Rash and other nonspecific skin eruption: Secondary | ICD-10-CM | POA: Diagnosis not present

## 2016-11-26 MED ORDER — LINAGLIPTIN 5 MG PO TABS: 5.0000 mg | ORAL_TABLET | Freq: Every day | ORAL | 11 refills | Status: DC

## 2016-11-26 MED ORDER — TRIAMCINOLONE ACETONIDE 0.1 % EX CREA: 1.0000 "application " | TOPICAL_CREAM | Freq: Three times a day (TID) | CUTANEOUS | 0 refills | Status: DC | PRN

## 2016-11-26 NOTE — Patient Instructions (Addendum)
check your blood sugar once a day.  vary the time of day when you check, between before the 3 meals, and at bedtime.  also check if you have symptoms of your blood sugar being too high or too low.  please keep a record of the readings and bring it to your next appointment here (or you can bring the meter itself).  You can write it on any piece of paper.  please call us sooner if your blood sugar goes below 70, or if you have a lot of readings over 200.   I have sent a prescription to your pharmacy, to change farxiga to "tradjenta."  I have also sent a prescription to your pharmacy, for a skin cream.  If necessary, in the future, we can try adding another med in the same category as farxiga.  Please come back for a follow-up appointment in 2 months.

## 2016-11-26 NOTE — Progress Notes (Signed)
Subjective:    Patient ID: Claire Irwin, female    DOB: 04-Dec-1970, 46 y.o.   MRN: [id number]  HPI Pt returns for f/u of diabetes mellitus: DM type: 2 Dx'ed: 0623 Complications: none Therapy: 2 oral meds GDM: never DKA: never Severe hypoglycemia: never.  Pancreatitis: never.  Other: she has never taken insulin.  Interval history: pt states she feels well in general.  She takes metformin as rx'ed.  Pt states 10 days of slight rash of the face, but no assoc itching.   Past Medical History:  Diagnosis Date  . Allergy   . Asthma   . Diabetes mellitus without complication ([place])   . 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 Fibrocystic breast disease   . GERD (gastroesophageal reflux disease)   . History of endometriosis   . Hyperlipidemia 06/29/2011  . Preeclampsia     Past Surgical History:  Procedure Laterality Date  . ABDOMINAL ADHESION SURGERY  07/2004  . CESAREAN SECTION  02/2004  . DILATION AND CURETTAGE, DIAGNOSTIC / THERAPEUTIC      Social History   Social History  . Marital status: Legally Separated    Spouse name: N/A  . Number of children: N/A  . Years of education: N/A   Occupational History  . Not on file.   Social History Main Topics  . Smoking status: Never Smoker  . Smokeless tobacco: Never Used  . Alcohol use No  . Drug use: No  . Sexual activity: Not on file   Other Topics Concern  . Not on file   Social History Narrative  . No narrative on file    Current Outpatient Prescriptions on File Prior to Visit  Medication Sig Dispense Refill  . albuterol (PROAIR HFA) 108 (90 Base) MCG/ACT inhaler USE 2 PUFFS 4 TIMES DAILY AS NEEDED 8.5 each 5  . aspirin EC 81 MG tablet Take 1 tablet (81 mg total) by mouth daily. 90 tablet 11  . clonazePAM (KLONOPIN) 0.5 MG tablet Take 1 tablet (0.5 mg total) by mouth 2 (two) times daily as needed for anxiety. 60 tablet 1  . cyclobenzaprine (FLEXERIL) 5 MG tablet TAKE 1 TABLET (5 MG TOTAL) BY  MOUTH 3 (THREE) TIMES DAILY AS NEEDED FOR MUSCLE SPASMS. 60 tablet 0  . Fluticasone-Salmeterol (ADVAIR DISKUS) 250-50 MCG/DOSE AEPB USE 1 PUFF TWO TIMES DAILY AS NEEDED 60 each 11  . glucose blood (ONETOUCH VERIO) test strip 1 each by Other route daily. And lancets 1/day 100 each 3  . hydrochlorothiazide (HYDRODIURIL) 25 MG tablet Take 0.5 tablets (12.5 mg total) by mouth daily. (Patient taking differently: Take 12.5 mg by mouth as needed. ) 45 tablet 3  . Lancets MISC Use as directed one per day 100 each 11  . metFORMIN (GLUCOPHAGE-XR) 500 MG 24 hr tablet Take 1 tablet (500 mg total) by mouth daily with breakfast. 90 tablet 3  . NONFORMULARY OR COMPOUNDED ITEM [facility]:  Wart Cream - Cimetidine 2%, Deoxy-D-Glucose 0.2%, Fluorouracil-5 5%, Salicylic Acid 76%, apply to affected areas daily. 120 each 2  . NORETHINDRONE ACETATE PO Take by mouth.      . pantoprazole (PROTONIX) 40 MG tablet Take 1 tablet (40 mg total) by mouth daily. 90 tablet 3  . Pediatric Multivit-Minerals-C ([facility] GUMMY MULTIVITAMIN KIDS) CHEW Chew 2 each by mouth.    . potassium chloride (KLOR-CON) 8 MEQ tablet Take 1 tablet (8 mEq total) by mouth daily. (Patient taking differently: Take 8 mEq by mouth  as needed. ) 90 tablet 3   Current Facility-Administered Medications on File Prior to Visit  Medication Dose Route Frequency Provider Last Rate Last Dose  . metFORMIN (GLUCOPHAGE-XR) 24 hr tablet 500 mg  500 mg Oral Q breakfast Biagio Borg, MD        Allergies  Allergen Reactions  . Hydrocodone   . Sulfonamide Derivatives     Family History  Problem Relation Age of Onset  . Diabetes Mother   . Hypertension Mother   . Asthma Mother   . Allergies Mother   . Diabetes Father   . Heart disease Paternal Uncle   . Asthma Maternal Grandmother   . Allergies Maternal Grandmother   . Cancer Paternal Grandmother     Brain and Lung    BP 132/84   Pulse 96   Ht 5' 4.5" (1.638 m)   Wt 195 lb (88.5 kg)   SpO2 98%    BMI 32.95 kg/m   Review of Systems Denies facial swelling, sob, and fever.     Objective:   Physical Exam VITAL SIGNS:  See vs page GENERAL: no distress Face: slight diffuse erythema, but no vesicles, ulcers, or warmth.    Lab Results  Component Value Date   HGBA1C 6.4 10/28/2016      Assessment & Plan:  Facial rash, new, uncertain etiology.  Uncertain if these is due to [place]. Type 2 DM: well-controlled off farxiga.  Patient Instructions  check your blood sugar once a day.  vary the time of day when you check, between before the 3 meals, and at bedtime.  also check if you have symptoms of your blood sugar being too high or too low.  please keep a record of the readings and bring it to your next appointment here (or you can bring the meter itself).  You can write it on any piece of paper.  please call us sooner if your blood sugar goes below 70, or if you have a lot of readings over 200.   I have sent a prescription to your pharmacy, to change farxiga to "tradjenta."  I have also sent a prescription to your pharmacy, for a skin cream.  If necessary, in the future, we can try adding another med in the same category as farxiga.  Please come back for a follow-up appointment in 2 months.

## 2017-01-26 ENCOUNTER — Other Ambulatory Visit: Payer: Self-pay

## 2017-01-26 MED ORDER — CYCLOBENZAPRINE HCL 5 MG PO TABS: ORAL_TABLET | ORAL | 0 refills | Status: DC

## 2017-01-26 NOTE — Telephone Encounter (Signed)
Please advise 

## 2017-01-27 ENCOUNTER — Ambulatory Visit: Payer: BC Managed Care – PPO | Admitting: Endocrinology

## 2017-01-28 ENCOUNTER — Encounter: Payer: Self-pay | Admitting: Endocrinology

## 2017-01-28 ENCOUNTER — Ambulatory Visit (INDEPENDENT_AMBULATORY_CARE_PROVIDER_SITE_OTHER): Payer: BC Managed Care – PPO | Admitting: Endocrinology

## 2017-01-28 VITALS — BP 118/78 | HR 87 | Ht 64.5 in | Wt 198.0 lb

## 2017-01-28 DIAGNOSIS — E119 Type 2 diabetes mellitus without complications: Secondary | ICD-10-CM

## 2017-01-28 LAB — POCT GLYCOSYLATED HEMOGLOBIN (HGB A1C): Hemoglobin A1C: 6.6

## 2017-01-28 MED ORDER — LINAGLIPTIN-METFORMIN HCL 2.5-500 MG PO TABS: 2.0000 | ORAL_TABLET | Freq: Every day | ORAL | 11 refills | Status: DC

## 2017-01-28 NOTE — Progress Notes (Signed)
Subjective:    Patient ID: Claire Irwin, female    DOB: [birth year], 46 y.o.   MRN: [id number]  HPI Pt returns for f/u of diabetes mellitus: DM type: 2 Dx'ed: 0240 Complications: none Therapy: 2 oral meds GDM: never DKA: never Severe hypoglycemia: never.  Pancreatitis: never.  Other: she has never taken insulin; she stopped farxiga (rash).  Interval history: pt states she feels well in general, but her diet and exercise are not good.  Past Medical History:  Diagnosis Date  . Allergy   . Asthma   . Diabetes mellitus without complication ([place])   . 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 Fibrocystic breast disease   . GERD (gastroesophageal reflux disease)   . History of endometriosis   . Hyperlipidemia 06/29/2011  . Preeclampsia     Past Surgical History:  Procedure Laterality Date  . ABDOMINAL ADHESION SURGERY  07/2004  . CESAREAN SECTION  02/2004  . DILATION AND CURETTAGE, DIAGNOSTIC / THERAPEUTIC      Social History   Social History  . Marital status: Legally Separated    Spouse name: N/A  . Number of children: N/A  . Years of education: N/A   Occupational History  . Not on file.   Social History Main Topics  . Smoking status: Never Smoker  . Smokeless tobacco: Never Used  . Alcohol use No  . Drug use: No  . Sexual activity: Not on file   Other Topics Concern  . Not on file   Social History Narrative  . No narrative on file    Current Outpatient Prescriptions on File Prior to Visit  Medication Sig Dispense Refill  . albuterol (PROAIR HFA) 108 (90 Base) MCG/ACT inhaler USE 2 PUFFS 4 TIMES DAILY AS NEEDED 8.5 each 5  . aspirin EC 81 MG tablet Take 1 tablet (81 mg total) by mouth daily. 90 tablet 11  . clonazePAM (KLONOPIN) 0.5 MG tablet Take 1 tablet (0.5 mg total) by mouth 2 (two) times daily as needed for anxiety. 60 tablet 1  . cyclobenzaprine (FLEXERIL) 5 MG tablet TAKE 1 TABLET (5 MG TOTAL) BY MOUTH 3 (THREE) TIMES DAILY AS  NEEDED FOR MUSCLE SPASMS. 60 tablet 0  . Fluticasone-Salmeterol (ADVAIR DISKUS) 250-50 MCG/DOSE AEPB USE 1 PUFF TWO TIMES DAILY AS NEEDED 60 each 11  . glucose blood (ONETOUCH VERIO) test strip 1 each by Other route daily. And lancets 1/day 100 each 3  . hydrochlorothiazide (HYDRODIURIL) 25 MG tablet Take 0.5 tablets (12.5 mg total) by mouth daily. (Patient taking differently: Take 12.5 mg by mouth as needed. ) 45 tablet 3  . Lancets MISC Use as directed one per day 100 each 11  . NONFORMULARY OR COMPOUNDED ITEM [facility]:  Wart Cream - Cimetidine 2%, Deoxy-D-Glucose 0.2%, Fluorouracil-5 5%, Salicylic Acid 97%, apply to affected areas daily. 120 each 2  . norethindrone (AYGESTIN) 5 MG tablet Take 5 mg by mouth daily.  10  . NORETHINDRONE ACETATE PO Take by mouth.      . pantoprazole (PROTONIX) 40 MG tablet Take 1 tablet (40 mg total) by mouth daily. 90 tablet 3  . Pediatric Multivit-Minerals-C ([facility] GUMMY MULTIVITAMIN KIDS) CHEW Chew 2 each by mouth.    . potassium chloride (KLOR-CON) 8 MEQ tablet Take 1 tablet (8 mEq total) by mouth daily. (Patient taking differently: Take 8 mEq by mouth as needed. ) 90 tablet 3  . triamcinolone cream (KENALOG) 0.1 % Apply 1 application topically 3 (  three) times daily as needed. For rash 45 g 0   Current Facility-Administered Medications on File Prior to Visit  Medication Dose Route Frequency Provider Last Rate Last Dose  . metFORMIN (GLUCOPHAGE-XR) 24 hr tablet 500 mg  500 mg Oral Q breakfast Biagio Borg, MD        Allergies  Allergen Reactions  . Hydrocodone   . Sulfonamide Derivatives     Family History  Problem Relation Age of Onset  . Diabetes Mother   . Hypertension Mother   . Asthma Mother   . Allergies Mother   . Diabetes Father   . Heart disease Paternal Uncle   . Asthma Maternal Grandmother   . Allergies Maternal Grandmother   . Cancer Paternal Grandmother        Brain and Lung    BP 118/78   Pulse 87   Ht 5' 4.5" (1.638 m)    Wt 198 lb (89.8 kg)   SpO2 98%   BMI 33.46 kg/m    Review of Systems She denies hypoglycemia.      Objective:   Physical Exam VITAL SIGNS:  See vs page GENERAL: no distress Pulses: dorsalis pedis intact bilat.   MSK: no deformity of the feet CV: no leg edema Skin:  no ulcer on the feet.  normal temp on the feet.  There is patchy hyperpigmentation of the ankles.   Neuro: sensation is intact to touch on the feet.  A1c=6.6%     Assessment & Plan:  Type 2 DM: she needs increased rx, if it can be done with a regimen that avoids or minimizes hypoglycemia.    Patient Instructions  check your blood sugar once a day.  vary the time of day when you check, between before the 3 meals, and at bedtime.  also check if you have symptoms of your blood sugar being too high or too low.  please keep a record of the readings and bring it to your next appointment here (or you can bring the meter itself).  You can write it on any piece of paper.  please call us sooner if your blood sugar goes below 70, or if you have a lot of readings over 200.   Please change both medications to a single one, 2 pills per day If necessary, in the future, we can try adding another med in the same category as farxiga.  Please come back for a follow-up appointment in 4 months.

## 2017-01-28 NOTE — Patient Instructions (Addendum)
check your blood sugar once a day.  vary the time of day when you check, between before the 3 meals, and at bedtime.  also check if you have symptoms of your blood sugar being too high or too low.  please keep a record of the readings and bring it to your next appointment here (or you can bring the meter itself).  You can write it on any piece of paper.  please call us sooner if your blood sugar goes below 70, or if you have a lot of readings over 200.   Please change both medications to a single one, 2 pills per day If necessary, in the future, we can try adding another med in the same category as farxiga.  Please come back for a follow-up appointment in 4 months.

## 2017-03-04 ENCOUNTER — Ambulatory Visit (INDEPENDENT_AMBULATORY_CARE_PROVIDER_SITE_OTHER): Payer: BC Managed Care – PPO | Admitting: Internal Medicine

## 2017-03-04 ENCOUNTER — Encounter: Payer: Self-pay | Admitting: Internal Medicine

## 2017-03-04 ENCOUNTER — Other Ambulatory Visit: Payer: Self-pay | Admitting: Family

## 2017-03-04 VITALS — BP 114/84 | HR 93 | Ht 64.5 in | Wt 200.0 lb

## 2017-03-04 DIAGNOSIS — K219 Gastro-esophageal reflux disease without esophagitis: Secondary | ICD-10-CM | POA: Diagnosis not present

## 2017-03-04 DIAGNOSIS — R059 Cough, unspecified: Secondary | ICD-10-CM

## 2017-03-04 DIAGNOSIS — Z114 Encounter for screening for human immunodeficiency virus [HIV]: Secondary | ICD-10-CM

## 2017-03-04 DIAGNOSIS — Z0000 Encounter for general adult medical examination without abnormal findings: Secondary | ICD-10-CM

## 2017-03-04 DIAGNOSIS — Z0001 Encounter for general adult medical examination with abnormal findings: Secondary | ICD-10-CM | POA: Diagnosis not present

## 2017-03-04 DIAGNOSIS — R05 Cough: Secondary | ICD-10-CM

## 2017-03-04 MED ORDER — PANTOPRAZOLE SODIUM 40 MG PO TBEC: 40.0000 mg | DELAYED_RELEASE_TABLET | Freq: Every day | ORAL | 3 refills | Status: DC

## 2017-03-04 MED ORDER — RANITIDINE HCL 150 MG PO TABS: 150.0000 mg | ORAL_TABLET | Freq: Every day | ORAL | 3 refills | Status: DC

## 2017-03-04 NOTE — Assessment & Plan Note (Signed)
With significant breakthrough qhs not fully improved with protononix daily x 3 days, to add zantac 150 qhs prn,  to f/u any worsening symptoms or concerns

## 2017-03-04 NOTE — Patient Instructions (Signed)
Please take all new medication as prescribed - the zantac for bedtime as needed  Please continue all other medications as before, and refills have been done if requested.  Please have the pharmacy call with any other refills you may need.  Please continue your efforts at being more active, low cholesterol diet, and weight control.  You are otherwise up to date with prevention measures today.  Please keep your appointments with your specialists as you may have planned  Please return in 1 year for your yearly visit, or sooner if needed, with Lab testing done 3-5 days before

## 2017-03-04 NOTE — Assessment & Plan Note (Signed)

## 2017-03-04 NOTE — Progress Notes (Signed)
Subjective:    Patient ID: Claire Irwin, female    DOB: May 23, 1971, 46 y.o.   MRN: [id number]  HPI  Here for wellness and f/u;  Overall doing ok;  Pt denies Chest pain, worsening SOB, DOE, wheezing, orthopnea, PND, worsening LE edema, palpitations, dizziness or syncope.  Pt denies neurological change such as new headache, facial or extremity weakness.  Pt denies polydipsia, polyuria, or low sugar symptoms. Pt states overall good compliance with treatment and medications, good tolerability, and has been trying to follow appropriate diet.  Pt denies worsening depressive symptoms, suicidal ideation or panic. No fever, night sweats, wt loss, loss of appetite, or other constitutional symptoms.  Pt states good ability with ADL's, has low fall risk, home safety reviewed and adequate, no other significant changes in hearing or vision, and only occasionally active with exercise. BP Readings from Last 3 Encounters:  03/04/17 114/84  01/28/17 118/78  11/26/16 132/84   Wt Readings from Last 3 Encounters:  03/04/17 200 lb (90.7 kg)  01/28/17 198 lb (89.8 kg)  11/26/16 195 lb (88.5 kg)  Has appt about Oct for pap.   Declines pneumonia shot today Past Medical History:  Diagnosis Date  . Allergy   . Asthma   . Diabetes mellitus without complication ([place])   . 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 Fibrocystic breast disease   . GERD (gastroesophageal reflux disease)   . History of endometriosis   . Hyperlipidemia 06/29/2011  . Preeclampsia    Past Surgical History:  Procedure Laterality Date  . ABDOMINAL ADHESION SURGERY  07/2004  . CESAREAN SECTION  02/2004  . DILATION AND CURETTAGE, DIAGNOSTIC / THERAPEUTIC      reports that she has never smoked. She has never used smokeless tobacco. She reports that she does not drink alcohol or use drugs. family history includes Allergies in her maternal grandmother and mother; Asthma in her maternal grandmother and mother; Cancer in her  paternal grandmother; Diabetes in her father and mother; Heart disease in her paternal uncle; Hypertension in her mother. Allergies  Allergen Reactions  . Hydrocodone   . Sulfonamide Derivatives    Current Outpatient Prescriptions on File Prior to Visit  Medication Sig Dispense Refill  . albuterol (PROAIR HFA) 108 (90 Base) MCG/ACT inhaler USE 2 PUFFS 4 TIMES DAILY AS NEEDED 8.5 each 5  . aspirin EC 81 MG tablet Take 1 tablet (81 mg total) by mouth daily. 90 tablet 11  . clonazePAM (KLONOPIN) 0.5 MG tablet Take 1 tablet (0.5 mg total) by mouth 2 (two) times daily as needed for anxiety. 60 tablet 1  . cyclobenzaprine (FLEXERIL) 5 MG tablet TAKE 1 TABLET (5 MG TOTAL) BY MOUTH 3 (THREE) TIMES DAILY AS NEEDED FOR MUSCLE SPASMS. 60 tablet 0  . Fluticasone-Salmeterol (ADVAIR DISKUS) 250-50 MCG/DOSE AEPB USE 1 PUFF TWO TIMES DAILY AS NEEDED 60 each 11  . glucose blood (ONETOUCH VERIO) test strip 1 each by Other route daily. And lancets 1/day 100 each 3  . hydrochlorothiazide (HYDRODIURIL) 25 MG tablet Take 0.5 tablets (12.5 mg total) by mouth daily. (Patient taking differently: Take 12.5 mg by mouth as needed. ) 45 tablet 3  . Lancets MISC Use as directed one per day 100 each 11  . Linagliptin-Metformin HCl 2.5-500 MG TABS Take 2 tablets by mouth daily. 60 tablet 11  . NONFORMULARY OR COMPOUNDED ITEM [facility]:  Wart Cream - Cimetidine 2%, Deoxy-D-Glucose 0.2%, Fluorouracil-5 5%, Salicylic Acid 31%, apply to  affected areas daily. 120 each 2  . norethindrone (AYGESTIN) 5 MG tablet Take 5 mg by mouth daily.  10  . NORETHINDRONE ACETATE PO Take by mouth.      . pantoprazole (PROTONIX) 40 MG tablet Take 1 tablet (40 mg total) by mouth daily. 90 tablet 3  . Pediatric Multivit-Minerals-C ([facility] GUMMY MULTIVITAMIN KIDS) CHEW Chew 2 each by mouth.    . potassium chloride (KLOR-CON) 8 MEQ tablet Take 1 tablet (8 mEq total) by mouth daily. (Patient taking differently: Take 8 mEq by mouth as needed. ) 90  tablet 3  . triamcinolone cream (KENALOG) 0.1 % Apply 1 application topically 3 (three) times daily as needed. For rash 45 g 0   Current Facility-Administered Medications on File Prior to Visit  Medication Dose Route Frequency Provider Last Rate Last Dose  . metFORMIN (GLUCOPHAGE-XR) 24 hr tablet 500 mg  500 mg Oral Q breakfast Biagio Borg, MD       Review of Systems Constitutional: Negative for other unusual diaphoresis, sweats, appetite or weight changes HENT: Negative for other worsening hearing loss, ear pain, facial swelling, mouth sores or neck stiffness.   Eyes: Negative for other worsening pain, redness or other visual disturbance.  Respiratory: Negative for other stridor or swelling Cardiovascular: Negative for other palpitations or other chest pain  Gastrointestinal: Negative for worsening diarrhea or loose stools, blood in stool, distention or other pain Genitourinary: Negative for hematuria, flank pain or other change in urine volume.  Musculoskeletal: Negative for myalgias or other joint swelling.  Skin: Negative for other color change, or other wound or worsening drainage.  Neurological: Negative for other syncope or numbness, but has had several right eyelid spasms recently, now better Hematological: Negative for other adenopathy or swelling Psychiatric/Behavioral: Negative for hallucinations, other worsening agitation, SI, self-injury, or new decreased concentration All other system neg per pt    Objective:   Physical Exam BP 114/84   Pulse 93   Ht 5' 4.5" (1.638 m)   Wt 200 lb (90.7 kg)   SpO2 99%   BMI 33.80 kg/m  VS noted,  Constitutional: Pt is oriented to person, place, and time. Appears well-developed and well-nourished, in no significant distress and comfortable Head: Normocephalic and atraumatic  Eyes: Conjunctivae and EOM are normal. Pupils are equal, round, and reactive to light Right Ear: External ear normal without discharge Left Ear: External ear  normal without discharge Nose: Nose without discharge or deformity Mouth/Throat: Oropharynx is without other ulcerations and moist  Neck: Normal range of motion. Neck supple. No JVD present. No tracheal deviation present or significant neck LA or mass Cardiovascular: Normal rate, regular rhythm, normal heart sounds and intact distal pulses.   Pulmonary/Chest: WOB normal and breath sounds without rales or wheezing  Abdominal: Soft. Bowel sounds are normal. No HSM with mild epigastric tender, without guarding or rebound  Musculoskeletal: Normal range of motion. Exhibits no edema Lymphadenopathy: Has no other cervical adenopathy.  Neurological: Pt is alert and oriented to person, place, and time. Pt has normal reflexes. No cranial nerve deficit. Motor grossly intact, Gait intact Skin: Skin is warm and dry. No rash noted or new ulcerations Psychiatric:  Has normal mood and affect. Behavior is normal without agitation No other exam findings  Lab Results  Component Value Date   WBC 7.5 02/06/2016   HGB 15.0 02/06/2016   HCT 44.4 02/06/2016   PLT 302.0 02/06/2016   GLUCOSE 110 (H) 02/06/2016   CHOL 167 02/06/2016   TRIG  92.0 02/06/2016   HDL 50.20 02/06/2016   LDLCALC 99 02/06/2016   ALT 24 02/06/2016   AST 21 02/06/2016   NA 139 02/06/2016   K 3.9 02/06/2016   CL 103 02/06/2016   CREATININE 1.13 02/06/2016   BUN 8 02/06/2016   CO2 27 02/06/2016   TSH 1.20 02/06/2016   HGBA1C 6.6 01/28/2017       Assessment & Plan:

## 2017-03-09 ENCOUNTER — Other Ambulatory Visit (INDEPENDENT_AMBULATORY_CARE_PROVIDER_SITE_OTHER): Payer: BC Managed Care – PPO

## 2017-03-09 DIAGNOSIS — K219 Gastro-esophageal reflux disease without esophagitis: Secondary | ICD-10-CM

## 2017-03-09 DIAGNOSIS — Z0000 Encounter for general adult medical examination without abnormal findings: Secondary | ICD-10-CM | POA: Diagnosis not present

## 2017-03-09 LAB — URINALYSIS, ROUTINE W REFLEX MICROSCOPIC
Bilirubin Urine: NEGATIVE
HGB URINE DIPSTICK: NEGATIVE
KETONES UR: NEGATIVE
Leukocytes, UA: NEGATIVE
Nitrite: NEGATIVE
RBC / HPF: NONE SEEN (ref 0–?)
SPECIFIC GRAVITY, URINE: 1.01 (ref 1.000–1.030)
Total Protein, Urine: NEGATIVE
Urine Glucose: NEGATIVE
Urobilinogen, UA: 0.2 (ref 0.0–1.0)
pH: 6 (ref 5.0–8.0)

## 2017-03-09 LAB — BASIC METABOLIC PANEL
BUN: 10 mg/dL (ref 6–23)
CHLORIDE: 105 meq/L (ref 96–112)
CO2: 25 meq/L (ref 19–32)
CREATININE: 1.1 mg/dL (ref 0.40–1.20)
Calcium: 9.7 mg/dL (ref 8.4–10.5)
GFR: 68.91 mL/min (ref >60.00)
Glucose, Bld: 129 mg/dL — ABNORMAL HIGH (ref 70–99)
POTASSIUM: 4.2 meq/L (ref 3.5–5.1)
Sodium: 140 mEq/L (ref 135–145)

## 2017-03-09 LAB — CBC WITH DIFFERENTIAL/PLATELET
Basophils Absolute: 0 K/uL (ref 0.0–0.1)
Basophils Relative: 0.7 % (ref 0.0–3.0)
Eosinophils Absolute: 0.1 K/uL (ref 0.0–0.7)
Eosinophils Relative: 1 % (ref 0.0–5.0)
HCT: 42.7 % (ref 36.0–46.0)
Hemoglobin: 14.5 g/dL (ref 12.0–15.0)
Lymphocytes Relative: 33.4 % (ref 12.0–46.0)
Lymphs Abs: 2.4 K/uL (ref 0.7–4.0)
MCHC: 34 g/dL (ref 30.0–36.0)
MCV: 86.9 fl (ref 78.0–100.0)
Monocytes Absolute: 0.5 K/uL (ref 0.1–1.0)
Monocytes Relative: 7 % (ref 3.0–12.0)
Neutro Abs: 4.1 K/uL (ref 1.4–7.7)
Neutrophils Relative %: 57.9 % (ref 43.0–77.0)
Platelets: 276 K/uL (ref 150.0–400.0)
RBC: 4.91 Mil/uL (ref 3.87–5.11)
RDW: 14.5 % (ref 11.5–15.5)
WBC: 7.2 K/uL (ref 4.0–10.5)

## 2017-03-09 LAB — TSH: TSH: 2 u[IU]/mL (ref 0.35–4.50)

## 2017-03-09 LAB — HEPATIC FUNCTION PANEL
ALT: 20 U/L (ref 0–35)
AST: 15 U/L (ref 0–37)
Albumin: 4 g/dL (ref 3.5–5.2)
Alkaline Phosphatase: 61 U/L (ref 39–117)
BILIRUBIN DIRECT: 0.1 mg/dL (ref 0.0–0.3)
BILIRUBIN TOTAL: 0.6 mg/dL (ref 0.2–1.2)
Total Protein: 7.2 g/dL (ref 6.0–8.3)

## 2017-03-09 LAB — LIPID PANEL
CHOL/HDL RATIO: 3
Cholesterol: 147 mg/dL (ref 0–200)
HDL: 48.6 mg/dL (ref >39.00)
LDL CALC: 86 mg/dL (ref 0–99)
NonHDL: 98.89
TRIGLYCERIDES: 63 mg/dL (ref 0.0–149.0)
VLDL: 12.6 mg/dL (ref 0.0–40.0)

## 2017-03-09 LAB — MICROALBUMIN / CREATININE URINE RATIO
CREATININE, U: 118.7 mg/dL
Microalb Creat Ratio: 0.6 mg/g (ref 0.0–30.0)
Microalb, Ur: <0.7 mg/dL (ref 0.0–1.9)

## 2017-03-10 LAB — HIV ANTIBODY (ROUTINE TESTING W REFLEX): HIV 1&2 Ab, 4th Generation: NONREACTIVE

## 2017-04-13 ENCOUNTER — Telehealth: Payer: Self-pay | Admitting: Endocrinology

## 2017-04-13 MED ORDER — LINAGLIPTIN 5 MG PO TABS: 5.0000 mg | ORAL_TABLET | Freq: Every day | ORAL | 11 refills | Status: DC

## 2017-04-13 MED ORDER — METFORMIN HCL ER 500 MG PO TB24: 500.0000 mg | ORAL_TABLET | Freq: Every day | ORAL | 3 refills | Status: DC

## 2017-04-13 NOTE — Telephone Encounter (Signed)
Ok, please reduce the metformin to 1 pill per day, and Please continue the same tradjenta.  I'll see you next time.

## 2017-04-13 NOTE — Telephone Encounter (Signed)
Patient called to advise that she has had problems with the metFORMIN (GLUCOPHAGE-XR) 24 hr tablet 500 mg and Tradjenta. Patient states that once she take the second pill as advised that she experiences stomach pains and cold chills in the middle of the night each time.   Patient also needs clarification on the Tradjenta medication dosage.   Call patient as soon as possible to advise.

## 2017-04-13 NOTE — Telephone Encounter (Signed)
Called and left patient VM to take the one pill of metformin daily & stay on same tradjenta.

## 2017-05-27 ENCOUNTER — Encounter: Payer: Self-pay | Admitting: Endocrinology

## 2017-05-27 ENCOUNTER — Ambulatory Visit (INDEPENDENT_AMBULATORY_CARE_PROVIDER_SITE_OTHER): Payer: BC Managed Care – PPO | Admitting: Endocrinology

## 2017-05-27 VITALS — BP 122/82 | HR 88 | Wt 200.0 lb

## 2017-05-27 DIAGNOSIS — E119 Type 2 diabetes mellitus without complications: Secondary | ICD-10-CM | POA: Diagnosis not present

## 2017-05-27 LAB — POCT GLYCOSYLATED HEMOGLOBIN (HGB A1C): Hemoglobin A1C: 7

## 2017-05-27 MED ORDER — EMPAGLIFLOZIN 10 MG PO TABS: 10.0000 mg | ORAL_TABLET | Freq: Every day | ORAL | 11 refills | Status: DC

## 2017-05-27 NOTE — Patient Instructions (Addendum)
check your blood sugar once a day.  vary the time of day when you check, between before the 3 meals, and at bedtime.  also check if you have symptoms of your blood sugar being too high or too low.  please keep a record of the readings and bring it to your next appointment here (or you can bring the meter itself).  You can write it on any piece of paper.  please call us sooner if your blood sugar goes below 70, or if you have a lot of readings over 200.   I have sent a prescription to your pharmacy, to add "jardiance."  Please come back for a follow-up appointment in 4 months.

## 2017-05-27 NOTE — Progress Notes (Signed)
Subjective:    Patient ID: Claire Irwin, female    DOB: 28-Oct-1970, 46 y.o.   MRN: [id number]  HPI Pt returns for f/u of diabetes mellitus: DM type: 2 Dx'ed: 4034 Complications: none Therapy: 2 oral meds GDM: never DKA: never Severe hypoglycemia: never.  Pancreatitis: never.  Other: she has never taken insulin; she stopped farxiga (rash).  Interval history: pt states she feels well in general, but her diet and exercise are not good.  Past Medical History:  Diagnosis Date  . Allergy   . Asthma   . Diabetes mellitus without complication ([place])   . 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 Fibrocystic breast disease   . GERD (gastroesophageal reflux disease)   . History of endometriosis   . Hyperlipidemia 06/29/2011  . Preeclampsia     Past Surgical History:  Procedure Laterality Date  . ABDOMINAL ADHESION SURGERY  07/2004  . CESAREAN SECTION  02/2004  . DILATION AND CURETTAGE, DIAGNOSTIC / THERAPEUTIC      Social History   Social History  . Marital status: Legally Separated    Spouse name: N/A  . Number of children: N/A  . Years of education: N/A   Occupational History  . Not on file.   Social History Main Topics  . Smoking status: Never Smoker  . Smokeless tobacco: Never Used  . Alcohol use No  . Drug use: No  . Sexual activity: Not on file   Other Topics Concern  . Not on file   Social History Narrative  . No narrative on file    Current Outpatient Prescriptions on File Prior to Visit  Medication Sig Dispense Refill  . ADVAIR DISKUS 250-50 MCG/DOSE AEPB USE 1 PUFF TWO TIMES DAILY AS NEEDED 60 each 1  . albuterol (PROAIR HFA) 108 (90 Base) MCG/ACT inhaler USE 2 PUFFS 4 TIMES DAILY AS NEEDED 8.5 each 5  . aspirin EC 81 MG tablet Take 1 tablet (81 mg total) by mouth daily. 90 tablet 11  . clonazePAM (KLONOPIN) 0.5 MG tablet Take 1 tablet (0.5 mg total) by mouth 2 (two) times daily as needed for anxiety. 60 tablet 1  . cyclobenzaprine  (FLEXERIL) 5 MG tablet TAKE 1 TABLET (5 MG TOTAL) BY MOUTH 3 (THREE) TIMES DAILY AS NEEDED FOR MUSCLE SPASMS. 60 tablet 0  . glucose blood (ONETOUCH VERIO) test strip 1 each by Other route daily. And lancets 1/day 100 each 3  . hydrochlorothiazide (HYDRODIURIL) 25 MG tablet Take 0.5 tablets (12.5 mg total) by mouth daily. (Patient taking differently: Take 12.5 mg by mouth as needed. ) 45 tablet 3  . Lancets MISC Use as directed one per day 100 each 11  . linagliptin (TRADJENTA) 5 MG TABS tablet Take 1 tablet (5 mg total) by mouth daily. 30 tablet 11  . metFORMIN (GLUCOPHAGE-XR) 500 MG 24 hr tablet Take 1 tablet (500 mg total) by mouth daily with breakfast. 90 tablet 3  . NONFORMULARY OR COMPOUNDED ITEM [facility]:  Wart Cream - Cimetidine 2%, Deoxy-D-Glucose 0.2%, Fluorouracil-5 5%, Salicylic Acid 74%, apply to affected areas daily. 120 each 2  . norethindrone (AYGESTIN) 5 MG tablet Take 5 mg by mouth daily.  10  . NORETHINDRONE ACETATE PO Take by mouth.      . pantoprazole (PROTONIX) 40 MG tablet Take 1 tablet (40 mg total) by mouth daily. 90 tablet 3  . Pediatric Multivit-Minerals-C ([facility] GUMMY MULTIVITAMIN KIDS) CHEW Chew 2 each by mouth.    Marland Kitchen  potassium chloride (KLOR-CON) 8 MEQ tablet Take 1 tablet (8 mEq total) by mouth daily. (Patient taking differently: Take 8 mEq by mouth as needed. ) 90 tablet 3  . prednisoLONE acetate (PRED FORTE) 1 % ophthalmic suspension 1 drop 4 (four) times daily.    . ranitidine (ZANTAC) 150 MG tablet Take 1 tablet (150 mg total) by mouth at bedtime. 90 tablet 3  . triamcinolone cream (KENALOG) 0.1 % Apply 1 application topically 3 (three) times daily as needed. For rash 45 g 0   Current Facility-Administered Medications on File Prior to Visit  Medication Dose Route Frequency Provider Last Rate Last Dose  . metFORMIN (GLUCOPHAGE-XR) 24 hr tablet 500 mg  500 mg Oral Q breakfast Biagio Borg, MD        Allergies  Allergen Reactions  . Hydrocodone   .  Sulfonamide Derivatives     Family History  Problem Relation Age of Onset  . Diabetes Mother   . Hypertension Mother   . Asthma Mother   . Allergies Mother   . Diabetes Father   . Heart disease Paternal Uncle   . Asthma Maternal Grandmother   . Allergies Maternal Grandmother   . Cancer Paternal Grandmother        Brain and Lung    BP 122/82   Pulse 88   Wt 200 lb (90.7 kg)   SpO2 98%   BMI 33.80 kg/m    Review of Systems She denies hypoglycemia.      Objective:   Physical Exam VITAL SIGNS:  See vs page GENERAL: no distress Pulses: foot pulses are intact bilaterally.   MSK: no deformity of the feet or ankles.  CV: no edema of the legs or ankles Skin:  no ulcer on the feet or ankles.  normal color and temp on the feet and ankles Neuro: sensation is intact to touch on the feet and ankles.    Lab Results  Component Value Date   HGBA1C 7.0 05/27/2017       Assessment & Plan:  type 2 DM: she needs increased rx, if it can be done with a regimen that avoids or minimizes hypoglycemia.   Patient Instructions  check your blood sugar once a day.  vary the time of day when you check, between before the 3 meals, and at bedtime.  also check if you have symptoms of your blood sugar being too high or too low.  please keep a record of the readings and bring it to your next appointment here (or you can bring the meter itself).  You can write it on any piece of paper.  please call us sooner if your blood sugar goes below 70, or if you have a lot of readings over 200.   I have sent a prescription to your pharmacy, to add "jardiance."  Please come back for a follow-up appointment in 4 months.

## 2017-05-31 ENCOUNTER — Ambulatory Visit: Payer: BC Managed Care – PPO | Admitting: Endocrinology

## 2017-06-05 ENCOUNTER — Other Ambulatory Visit: Payer: Self-pay | Admitting: Internal Medicine

## 2017-06-05 DIAGNOSIS — R059 Cough, unspecified: Secondary | ICD-10-CM

## 2017-06-05 DIAGNOSIS — R05 Cough: Secondary | ICD-10-CM

## 2017-06-07 ENCOUNTER — Telehealth: Payer: Self-pay | Admitting: Internal Medicine

## 2017-06-07 MED ORDER — ALBUTEROL SULFATE HFA 108 (90 BASE) MCG/ACT IN AERS: INHALATION_SPRAY | RESPIRATORY_TRACT | 2 refills | Status: DC

## 2017-06-07 NOTE — Telephone Encounter (Signed)
Pt called for a refill of her  albuterol (PROAIR HFA) 108 (90 Base) MCG/ACT inhaler  Please advise

## 2017-06-07 NOTE — Telephone Encounter (Signed)
Reviewed chart pt is up-to-date sent refills to [facility].../[facility]  

## 2017-09-15 ENCOUNTER — Other Ambulatory Visit: Payer: Self-pay

## 2017-09-15 MED ORDER — EMPAGLIFLOZIN 10 MG PO TABS: 10.0000 mg | ORAL_TABLET | Freq: Every day | ORAL | 11 refills | Status: DC

## 2017-09-16 ENCOUNTER — Other Ambulatory Visit: Payer: Self-pay

## 2017-09-27 ENCOUNTER — Ambulatory Visit (INDEPENDENT_AMBULATORY_CARE_PROVIDER_SITE_OTHER): Payer: BC Managed Care – PPO | Admitting: Endocrinology

## 2017-09-27 ENCOUNTER — Encounter: Payer: Self-pay | Admitting: Endocrinology

## 2017-09-27 VITALS — BP 112/80 | HR 93 | Wt 192.4 lb

## 2017-09-27 DIAGNOSIS — E119 Type 2 diabetes mellitus without complications: Secondary | ICD-10-CM | POA: Diagnosis not present

## 2017-09-27 LAB — POCT GLYCOSYLATED HEMOGLOBIN (HGB A1C): Hemoglobin A1C: 6.2

## 2017-09-27 NOTE — Progress Notes (Signed)
Subjective:    Patient ID: Claire Irwin, female    DOB: January 30, 1971, 47 y.o.   MRN: [id number]  HPI Pt returns for f/u of diabetes mellitus: DM type: 2 Dx'ed: 7829 Complications: none Therapy: 3 oral meds GDM: never DKA: never Severe hypoglycemia: never.  Pancreatitis: never.  Other: she has never taken insulin; she did not tolerate farxiga (rash).  Interval history: pt states she feels well in general.  She takes meds as rx'ed Past Medical History:  Diagnosis Date  . Allergy   . Asthma   . Diabetes mellitus without complication ([place])   . 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 Fibrocystic breast disease   . GERD (gastroesophageal reflux disease)   . History of endometriosis   . Hyperlipidemia 06/29/2011  . Preeclampsia     Past Surgical History:  Procedure Laterality Date  . ABDOMINAL ADHESION SURGERY  07/2004  . CESAREAN SECTION  02/2004  . DILATION AND CURETTAGE, DIAGNOSTIC / THERAPEUTIC      Social History   Socioeconomic History  . Marital status: Legally Separated    Spouse name: Not on file  . Number of children: Not on file  . Years of education: Not on file  . Highest education level: Not on file  Social Needs  . Financial resource strain: Not on file  . Food insecurity - worry: Not on file  . Food insecurity - inability: Not on file  . Transportation needs - medical: Not on file  . Transportation needs - non-medical: Not on file  Occupational History  . Not on file  Tobacco Use  . Smoking status: Never Smoker  . Smokeless tobacco: Never Used  Substance and Sexual Activity  . Alcohol use: No  . Drug use: No  . Sexual activity: Not on file  Other Topics Concern  . Not on file  Social History Narrative  . Not on file    Current Outpatient Medications on File Prior to Visit  Medication Sig Dispense Refill  . ADVAIR DISKUS 250-50 MCG/DOSE AEPB USE 1 PUFF TWO TIMES DAILY AS NEEDED 60 each 1  . albuterol (PROAIR HFA) 108 (90  Base) MCG/ACT inhaler USE 2 PUFFS 4 TIMES DAILY AS NEEDED 8.5 each 2  . aspirin EC 81 MG tablet Take 1 tablet (81 mg total) by mouth daily. 90 tablet 11  . clonazePAM (KLONOPIN) 0.5 MG tablet Take 1 tablet (0.5 mg total) by mouth 2 (two) times daily as needed for anxiety. 60 tablet 1  . cyclobenzaprine (FLEXERIL) 5 MG tablet TAKE 1 TABLET (5 MG TOTAL) BY MOUTH 3 (THREE) TIMES DAILY AS NEEDED FOR MUSCLE SPASMS. 60 tablet 0  . empagliflozin (JARDIANCE) 10 MG TABS tablet Take 10 mg by mouth daily. 30 tablet 11  . glucose blood (ONETOUCH VERIO) test strip 1 each by Other route daily. And lancets 1/day 100 each 3  . hydrochlorothiazide (HYDRODIURIL) 25 MG tablet Take 0.5 tablets (12.5 mg total) by mouth daily. (Patient taking differently: Take 12.5 mg by mouth as needed. ) 45 tablet 3  . Lancets MISC Use as directed one per day 100 each 11  . linagliptin (TRADJENTA) 5 MG TABS tablet Take 1 tablet (5 mg total) by mouth daily. 30 tablet 11  . metFORMIN (GLUCOPHAGE-XR) 500 MG 24 hr tablet Take 1 tablet (500 mg total) by mouth daily with breakfast. 90 tablet 3  . NONFORMULARY OR COMPOUNDED ITEM [facility]:  Wart Cream - Cimetidine 2%, Deoxy-D-Glucose 0.2%, Fluorouracil-5  5%, Salicylic Acid 93%, apply to affected areas daily. 120 each 2  . norethindrone (AYGESTIN) 5 MG tablet Take 5 mg by mouth daily.  10  . NORETHINDRONE ACETATE PO Take by mouth.      . pantoprazole (PROTONIX) 40 MG tablet Take 1 tablet (40 mg total) by mouth daily. 90 tablet 3  . Pediatric Multivit-Minerals-C ([facility] GUMMY MULTIVITAMIN KIDS) CHEW Chew 2 each by mouth.    . potassium chloride (KLOR-CON) 8 MEQ tablet Take 1 tablet (8 mEq total) by mouth daily. (Patient taking differently: Take 8 mEq by mouth as needed. ) 90 tablet 3  . prednisoLONE acetate (PRED FORTE) 1 % ophthalmic suspension 1 drop 4 (four) times daily.    . ranitidine (ZANTAC) 150 MG tablet Take 1 tablet (150 mg total) by mouth at bedtime. 90 tablet 3  .  triamcinolone cream (KENALOG) 0.1 % Apply 1 application topically 3 (three) times daily as needed. For rash 45 g 0   Current Facility-Administered Medications on File Prior to Visit  Medication Dose Route Frequency Provider Last Rate Last Dose  . metFORMIN (GLUCOPHAGE-XR) 24 hr tablet 500 mg  500 mg Oral Q breakfast Biagio Borg, MD        Allergies  Allergen Reactions  . Hydrocodone   . Sulfonamide Derivatives     Family History  Problem Relation Age of Onset  . Diabetes Mother   . Hypertension Mother   . Asthma Mother   . Allergies Mother   . Diabetes Father   . Heart disease Paternal Uncle   . Asthma Maternal Grandmother   . Allergies Maternal Grandmother   . Cancer Paternal Grandmother        Brain and Lung    BP 112/80 (BP Location: Left Arm, Patient Position: Sitting, Cuff Size: Normal)   Pulse 93   Wt 192 lb 6.4 oz (87.3 kg)   SpO2 98%   BMI 32.52 kg/m    Review of Systems She denies hypoglycemia    Objective:   Physical Exam VITAL SIGNS:  See vs page GENERAL: no distress Pulses: dorsalis pedis intact bilat.   MSK: no deformity of the feet CV: no leg edema Skin:  no ulcer on the feet.  normal color and temp on the feet. Neuro: sensation is intact to touch on the feet  A1c=6.2%     Assessment & Plan:  Type 2 DM: well-controlled.  Rash: resolved, but this limits rx options.    Patient Instructions  check your blood sugar once a day.  vary the time of day when you check, between before the 3 meals, and at bedtime.  also check if you have symptoms of your blood sugar being too high or too low.  please keep a record of the readings and bring it to your next appointment here (or you can bring the meter itself).  You can write it on any piece of paper.  please call us sooner if your blood sugar goes below 70, or if you have a lot of readings over 200.   Please continue the same medications for diabetes.  Please come back for a follow-up appointment in 4-6  months.

## 2017-09-27 NOTE — Patient Instructions (Addendum)
check your blood sugar once a day.  vary the time of day when you check, between before the 3 meals, and at bedtime.  also check if you have symptoms of your blood sugar being too high or too low.  please keep a record of the readings and bring it to your next appointment here (or you can bring the meter itself).  You can write it on any piece of paper.  please call us sooner if your blood sugar goes below 70, or if you have a lot of readings over 200.   Please continue the same medications for diabetes.  Please come back for a follow-up appointment in 4-6 months.      

## 2017-10-13 ENCOUNTER — Other Ambulatory Visit: Payer: Self-pay | Admitting: Podiatry

## 2017-10-13 ENCOUNTER — Encounter: Payer: Self-pay | Admitting: Podiatry

## 2017-10-13 ENCOUNTER — Ambulatory Visit: Payer: BC Managed Care – PPO | Admitting: Podiatry

## 2017-10-13 ENCOUNTER — Ambulatory Visit: Payer: Self-pay

## 2017-10-13 ENCOUNTER — Ambulatory Visit (INDEPENDENT_AMBULATORY_CARE_PROVIDER_SITE_OTHER): Payer: BC Managed Care – PPO

## 2017-10-13 DIAGNOSIS — M722 Plantar fascial fibromatosis: Secondary | ICD-10-CM

## 2017-10-13 MED ORDER — TRIAMCINOLONE ACETONIDE 10 MG/ML IJ SUSP
10.0000 mg | Freq: Once | INTRAMUSCULAR | Status: AC
  Administered 2017-10-13: 10 mg

## 2017-10-13 NOTE — Patient Instructions (Signed)

## 2017-10-14 NOTE — Progress Notes (Signed)
Subjective:   Patient ID: Claire Irwin, female   DOB: 47 y.o.   MRN: [id number]   HPI Patient states that she was doing better but in the last couple weeks she has developed a flareup in her left heel and also moderate discomfort in her right heel.  She is been trying Aleve for relief and has a night splint but has not been using   ROS      Objective:  Physical Exam  Neurovascular status intact with intense discomfort plantar aspect heel left over right with a acute manifestation with inflammation noted     Assessment:  Acute plantar fasciitis probably brought on by activity levels left over right     Plan:  H&P condition reviewed x-rays reviewed and injected the fascia left 3 mg Kenalog 5 mg Xylocaine advised on physical therapy and return to night splint with instructions today on how to use it  X-rays indicate that there is spur formation but no indications of acute stress fracture or advancement of arthritis bilateral

## 2017-11-11 ENCOUNTER — Telehealth: Payer: Self-pay | Admitting: Endocrinology

## 2017-11-11 NOTE — Telephone Encounter (Signed)
Patient's insurance does not cover Jentadeuto. Patient needs RX (Dr Loanne Drilling told patient he would prescribe medication that separates the medications).e.g 1 RX for Metformin and a different RX for the other medication. [facility] [address]

## 2017-11-11 NOTE — Telephone Encounter (Signed)
I am happy to separate these, but if ins doesn't pay for jentadueto, they won't pay for tradjenta.  What alternative do they cover?

## 2017-11-18 NOTE — Telephone Encounter (Signed)
Patient stated that she had enough jentadueto to last her until the end of the month & she would call insurance company to let us know. She stated that with co-pay card that she was getting Jardiance for free & wondered if we had a co-pay card for tradjenta? I will check to see if we have one that I can send to patient.

## 2017-11-18 NOTE — Telephone Encounter (Signed)
We are always happy to prescribe covered alternatives, or to give copay cards--both when we can.  The cards can also be printed from the internet.

## 2017-11-19 ENCOUNTER — Telehealth: Payer: Self-pay | Admitting: Endocrinology

## 2017-11-19 NOTE — Telephone Encounter (Signed)
Patient Stated that insurance would cover Januvia 100 mg only if they get a PA for this. or Alogliptin 5 mg   [phone]     Please advise    [id number] - [place], [place] - [place].

## 2017-11-19 NOTE — Telephone Encounter (Signed)
I checked and we did not have tradjenta co-pay card. I am waiting to hear from patient on what insurance company states is covered.

## 2017-11-22 NOTE — Telephone Encounter (Signed)
Yes, thank you.

## 2017-11-26 ENCOUNTER — Other Ambulatory Visit: Payer: Self-pay

## 2017-11-26 MED ORDER — SITAGLIPTIN PHOSPHATE 100 MG PO TABS: 100.0000 mg | ORAL_TABLET | Freq: Every day | ORAL | 11 refills | Status: DC

## 2017-11-26 NOTE — Telephone Encounter (Signed)
PA was approved for patient & I have sent in [place] prescription.

## 2018-03-08 ENCOUNTER — Encounter: Payer: BC Managed Care – PPO | Admitting: Internal Medicine

## 2018-03-11 ENCOUNTER — Encounter: Payer: Self-pay | Admitting: Internal Medicine

## 2018-03-11 ENCOUNTER — Other Ambulatory Visit (INDEPENDENT_AMBULATORY_CARE_PROVIDER_SITE_OTHER): Payer: BC Managed Care – PPO

## 2018-03-11 ENCOUNTER — Ambulatory Visit (INDEPENDENT_AMBULATORY_CARE_PROVIDER_SITE_OTHER): Payer: BC Managed Care – PPO | Admitting: Internal Medicine

## 2018-03-11 VITALS — BP 110/70 | HR 85 | Ht 64.5 in | Wt 192.0 lb

## 2018-03-11 DIAGNOSIS — Z0000 Encounter for general adult medical examination without abnormal findings: Secondary | ICD-10-CM | POA: Diagnosis not present

## 2018-03-11 DIAGNOSIS — E119 Type 2 diabetes mellitus without complications: Secondary | ICD-10-CM | POA: Diagnosis not present

## 2018-03-11 DIAGNOSIS — R05 Cough: Secondary | ICD-10-CM

## 2018-03-11 DIAGNOSIS — R059 Cough, unspecified: Secondary | ICD-10-CM

## 2018-03-11 LAB — HEPATIC FUNCTION PANEL
ALT: 24 U/L (ref 0–35)
AST: 19 U/L (ref 0–37)
Albumin: 4.2 g/dL (ref 3.5–5.2)
Alkaline Phosphatase: 64 U/L (ref 39–117)
BILIRUBIN DIRECT: 0.1 mg/dL (ref 0.0–0.3)
BILIRUBIN TOTAL: 0.5 mg/dL (ref 0.2–1.2)
Total Protein: 7.6 g/dL (ref 6.0–8.3)

## 2018-03-11 LAB — URINALYSIS, ROUTINE W REFLEX MICROSCOPIC
BILIRUBIN URINE: NEGATIVE
Hgb urine dipstick: NEGATIVE
KETONES UR: NEGATIVE
LEUKOCYTES UA: NEGATIVE
Nitrite: NEGATIVE
PH: 6 (ref 5.0–8.0)
RBC / HPF: NONE SEEN (ref 0–?)
Specific Gravity, Urine: <=1.005 — AB (ref 1.000–1.030)
TOTAL PROTEIN, URINE-UPE24: NEGATIVE
UROBILINOGEN UA: 0.2 (ref 0.0–1.0)
Urine Glucose: >=1000 — AB

## 2018-03-11 LAB — CBC WITH DIFFERENTIAL/PLATELET
BASOS PCT: 0.9 % (ref 0.0–3.0)
Basophils Absolute: 0.1 10*3/uL (ref 0.0–0.1)
EOS ABS: 0.1 10*3/uL (ref 0.0–0.7)
Eosinophils Relative: 1.2 % (ref 0.0–5.0)
HCT: 46.6 % — ABNORMAL HIGH (ref 36.0–46.0)
Hemoglobin: 16 g/dL — ABNORMAL HIGH (ref 12.0–15.0)
LYMPHS ABS: 1.9 10*3/uL (ref 0.7–4.0)
Lymphocytes Relative: 29.8 % (ref 12.0–46.0)
MCHC: 34.4 g/dL (ref 30.0–36.0)
MCV: 87.3 fl (ref 78.0–100.0)
MONO ABS: 0.5 10*3/uL (ref 0.1–1.0)
Monocytes Relative: 8 % (ref 3.0–12.0)
NEUTROS ABS: 3.7 10*3/uL (ref 1.4–7.7)
NEUTROS PCT: 60.1 % (ref 43.0–77.0)
Platelets: 269 10*3/uL (ref 150.0–400.0)
RBC: 5.34 Mil/uL — ABNORMAL HIGH (ref 3.87–5.11)
RDW: 14.2 % (ref 11.5–15.5)
WBC: 6.2 10*3/uL (ref 4.0–10.5)

## 2018-03-11 LAB — HEMOGLOBIN A1C: Hgb A1c MFr Bld: 6.4 % (ref 4.6–6.5)

## 2018-03-11 LAB — MICROALBUMIN / CREATININE URINE RATIO
CREATININE, U: 96.3 mg/dL
Microalb Creat Ratio: 0.7 mg/g (ref 0.0–30.0)

## 2018-03-11 LAB — LIPID PANEL
CHOL/HDL RATIO: 3
Cholesterol: 160 mg/dL (ref 0–200)
HDL: 47.8 mg/dL (ref >39.00)
LDL Cholesterol: 95 mg/dL (ref 0–99)
NONHDL: 112.23
Triglycerides: 88 mg/dL (ref 0.0–149.0)
VLDL: 17.6 mg/dL (ref 0.0–40.0)

## 2018-03-11 LAB — BASIC METABOLIC PANEL
BUN: 11 mg/dL (ref 6–23)
CHLORIDE: 105 meq/L (ref 96–112)
CO2: 25 meq/L (ref 19–32)
CREATININE: 1.24 mg/dL — AB (ref 0.40–1.20)
Calcium: 9.6 mg/dL (ref 8.4–10.5)
GFR: 59.75 mL/min — ABNORMAL LOW (ref >60.00)
Glucose, Bld: 128 mg/dL — ABNORMAL HIGH (ref 70–99)
POTASSIUM: 3.9 meq/L (ref 3.5–5.1)
SODIUM: 141 meq/L (ref 135–145)

## 2018-03-11 LAB — TSH: TSH: 1.46 u[IU]/mL (ref 0.35–4.50)

## 2018-03-11 MED ORDER — TRIAMCINOLONE ACETONIDE 0.1 % EX CREA: 1.0000 "application " | TOPICAL_CREAM | Freq: Three times a day (TID) | CUTANEOUS | 0 refills | Status: DC | PRN

## 2018-03-11 MED ORDER — POTASSIUM CHLORIDE ER 8 MEQ PO TBCR: 8.0000 meq | EXTENDED_RELEASE_TABLET | Freq: Every day | ORAL | 3 refills | Status: DC | PRN

## 2018-03-11 MED ORDER — HYDROCHLOROTHIAZIDE 25 MG PO TABS: 12.5000 mg | ORAL_TABLET | ORAL | 3 refills | Status: DC | PRN

## 2018-03-11 MED ORDER — ALBUTEROL SULFATE HFA 108 (90 BASE) MCG/ACT IN AERS: INHALATION_SPRAY | RESPIRATORY_TRACT | 11 refills | Status: DC

## 2018-03-11 MED ORDER — FLUTICASONE-SALMETEROL 250-50 MCG/DOSE IN AEPB: INHALATION_SPRAY | RESPIRATORY_TRACT | 3 refills | Status: DC

## 2018-03-11 NOTE — Progress Notes (Signed)
Subjective:    Patient ID: Claire Irwin, female    DOB: 1971/02/14, 47 y.o.   MRN: [id number]  HPI  Here for wellness and f/u;  Overall doing ok;  Pt denies Chest pain, worsening SOB, DOE, wheezing, orthopnea, PND, worsening LE edema, palpitations, dizziness or syncope.  Pt denies neurological change such as new headache, facial or extremity weakness.  Pt denies polydipsia, polyuria, or low sugar symptoms. Pt states overall good compliance with treatment and medications, good tolerability, and has been trying to follow appropriate diet.  Pt denies worsening depressive symptoms, suicidal ideation or panic. No fever, night sweats, wt loss, loss of appetite, or other constitutional symptoms.  Pt states good ability with ADL's, has low fall risk, home safety reviewed and adequate, no other significant changes in hearing or vision, and only occasionally active with exercise.  C/o worsening reflux despite mult otc TUMS etc and Rx PPI trials abd pain, dysphagia, n/v, bowel change or blood. Has had more stress recently, with daughter with ADD, eating d/o and anxiety, and father had fallen with pelvic fx.  No other new complaints Past Medical History:  Diagnosis Date  . Allergy   . Asthma   . Diabetes mellitus without complication ([place])   . 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 Fibrocystic breast disease   . GERD (gastroesophageal reflux disease)   . History of endometriosis   . Hyperlipidemia 06/29/2011  . Preeclampsia    Past Surgical History:  Procedure Laterality Date  . ABDOMINAL ADHESION SURGERY  07/2004  . CESAREAN SECTION  02/2004  . DILATION AND CURETTAGE, DIAGNOSTIC / THERAPEUTIC      reports that she has never smoked. She has never used smokeless tobacco. She reports that she does not drink alcohol or use drugs. family history includes Allergies in her maternal grandmother and mother; Asthma in her maternal grandmother and mother; Cancer in her paternal grandmother;  Diabetes in her father and mother; Heart disease in her paternal uncle; Hypertension in her mother. Allergies  Allergen Reactions  . Hydrocodone   . Sulfonamide Derivatives    Current Outpatient Medications on File Prior to Visit  Medication Sig Dispense Refill  . aspirin EC 81 MG tablet Take 1 tablet (81 mg total) by mouth daily. 90 tablet 11  . clonazePAM (KLONOPIN) 0.5 MG tablet Take 1 tablet (0.5 mg total) by mouth 2 (two) times daily as needed for anxiety. 60 tablet 1  . cyclobenzaprine (FLEXERIL) 5 MG tablet TAKE 1 TABLET (5 MG TOTAL) BY MOUTH 3 (THREE) TIMES DAILY AS NEEDED FOR MUSCLE SPASMS. 60 tablet 0  . empagliflozin (JARDIANCE) 10 MG TABS tablet Take 10 mg by mouth daily. 30 tablet 11  . glucose blood (ONETOUCH VERIO) test strip 1 each by Other route daily. And lancets 1/day 100 each 3  . Lancets MISC Use as directed one per day 100 each 11  . linagliptin (TRADJENTA) 5 MG TABS tablet Take 1 tablet (5 mg total) by mouth daily. 30 tablet 11  . metFORMIN (GLUCOPHAGE-XR) 500 MG 24 hr tablet Take 1 tablet (500 mg total) by mouth daily with breakfast. 90 tablet 3  . Multiple Vitamins-Calcium (ONE-A-DAY WOMENS PO) Take by mouth.    . NONFORMULARY OR COMPOUNDED ITEM [facility]:  Wart Cream - Cimetidine 2%, Deoxy-D-Glucose 0.2%, Fluorouracil-5 5%, Salicylic Acid 77%, apply to affected areas daily. 120 each 2  . norethindrone (AYGESTIN) 5 MG tablet Take 5 mg by mouth daily.  10  .  NORETHINDRONE ACETATE PO Take by mouth.      . pantoprazole (PROTONIX) 40 MG tablet Take 1 tablet (40 mg total) by mouth daily. 90 tablet 3  . prednisoLONE acetate (PRED FORTE) 1 % ophthalmic suspension 1 drop 4 (four) times daily.    . ranitidine (ZANTAC) 150 MG tablet Take 1 tablet (150 mg total) by mouth at bedtime. 90 tablet 3  . sitaGLIPtin (JANUVIA) 100 MG tablet Take 1 tablet (100 mg total) by mouth daily. 30 tablet 11   Current Facility-Administered Medications on File Prior to Visit  Medication  Dose Route Frequency Provider Last Rate Last Dose  . metFORMIN (GLUCOPHAGE-XR) 24 hr tablet 500 mg  500 mg Oral Q breakfast Biagio Borg, MD       Review of Systems Constitutional: Negative for other unusual diaphoresis, sweats, appetite or weight changes HENT: Negative for other worsening hearing loss, ear pain, facial swelling, mouth sores or neck stiffness.   Eyes: Negative for other worsening pain, redness or other visual disturbance.  Respiratory: Negative for other stridor or swelling Cardiovascular: Negative for other palpitations or other chest pain  Gastrointestinal: Negative for worsening diarrhea or loose stools, blood in stool, distention or other pain Genitourinary: Negative for hematuria, flank pain or other change in urine volume.  Musculoskeletal: Negative for myalgias or other joint swelling.  Skin: Negative for other color change, or other wound or worsening drainage.  Neurological: Negative for other syncope or numbness. Hematological: Negative for other adenopathy or swelling Psychiatric/Behavioral: Negative for hallucinations, other worsening agitation, SI, self-injury, or new decreased concentration All other system neg per pt    Objective:   Physical Exam BP 110/70 (BP Location: Left Arm, Patient Position: Sitting, Cuff Size: Large)   Pulse 85   Ht 5' 4.5" (1.638 m)   Wt 192 lb (87.1 kg)   SpO2 98%   BMI 32.45 kg/m  VS noted,  Constitutional: Pt is oriented to person, place, and time. Appears well-developed and well-nourished, in no significant distress and comfortable Head: Normocephalic and atraumatic  Eyes: Conjunctivae and EOM are normal. Pupils are equal, round, and reactive to light Right Ear: External ear normal without discharge Left Ear: External ear normal without discharge Nose: Nose without discharge or deformity Mouth/Throat: Oropharynx is without other ulcerations and moist  Neck: Normal range of motion. Neck supple. No JVD present. No tracheal  deviation present or significant neck LA or mass Cardiovascular: Normal rate, regular rhythm, normal heart sounds and intact distal pulses.   Pulmonary/Chest: WOB normal and breath sounds without rales or wheezing  Abdominal: Soft. Bowel sounds are normal. NT. No HSM  Musculoskeletal: Normal range of motion. Exhibits no edema Lymphadenopathy: Has no other cervical adenopathy.  Neurological: Pt is alert and oriented to person, place, and time. Pt has normal reflexes. No cranial nerve deficit. Motor grossly intact, Gait intact Skin: Skin is warm and dry. No rash noted or new ulcerations Psychiatric:  Has nervous mood and affect. Behavior is normal without agitation No other exam findings  Lab Results  Component Value Date   WBC 7.2 03/09/2017   HGB 14.5 03/09/2017   HCT 42.7 03/09/2017   PLT 276.0 03/09/2017   GLUCOSE 129 (H) 03/09/2017   CHOL 147 03/09/2017   TRIG 63.0 03/09/2017   HDL 48.60 03/09/2017   LDLCALC 86 03/09/2017   ALT 20 03/09/2017   AST 15 03/09/2017   NA 140 03/09/2017   K 4.2 03/09/2017   CL 105 03/09/2017   CREATININE 1.10 03/09/2017  BUN 10 03/09/2017   CO2 25 03/09/2017   TSH 2.00 03/09/2017   HGBA1C 6.2 09/27/2017   MICROALBUR <0.7 03/09/2017       Assessment & Plan:

## 2018-03-11 NOTE — Assessment & Plan Note (Signed)
stable overall by history and exam, recent data reviewed with pt, and pt to continue medical treatment as before,  to f/u any worsening symptoms or concerns Lab Results  Component Value Date   HGBA1C 6.2 09/27/2017

## 2018-03-11 NOTE — Assessment & Plan Note (Signed)

## 2018-03-11 NOTE — Patient Instructions (Signed)
Please take all new medication as prescribed - the steroid cream  Please continue all other medications as before, and refills have been done if requested.  Please have the pharmacy call with any other refills you may need.  Please continue your efforts at being more active, low cholesterol diet, and weight control.  You are otherwise up to date with prevention measures today.  Please keep your appointments with your specialists as you may have planned  Please go to the LAB in the Basement (turn left off the elevator) for the tests to be done today  You will be contacted by phone if any changes need to be made immediately.  Otherwise, you will receive a letter about your results with an explanation, but please check with MyChart first.  Please remember to sign up for MyChart if you have not done so, as this will be important to you in the future with finding out test results, communicating by private email, and scheduling acute appointments online when needed.  Please return in 6 months, or sooner if needed, with Lab testing done 3-5 days before

## 2018-03-14 ENCOUNTER — Telehealth: Payer: Self-pay | Admitting: Internal Medicine

## 2018-03-14 MED ORDER — PANTOPRAZOLE SODIUM 40 MG PO TBEC: 40.0000 mg | DELAYED_RELEASE_TABLET | Freq: Every day | ORAL | 3 refills | Status: DC

## 2018-03-14 MED ORDER — PANTOPRAZOLE SODIUM 40 MG PO TBEC: 40.0000 mg | DELAYED_RELEASE_TABLET | Freq: Two times a day (BID) | ORAL | 3 refills | Status: DC

## 2018-03-14 NOTE — Telephone Encounter (Signed)
Ok this is done 

## 2018-03-14 NOTE — Telephone Encounter (Signed)
Pls advise if ok to change sig to twice a day...Johny Chess

## 2018-03-14 NOTE — Telephone Encounter (Signed)
Pt called back she states MD stated she can take twice a day. If so will need new rx .Marland Kitchen..Johny Chess

## 2018-03-14 NOTE — Addendum Note (Signed)
Addended by: Biagio Borg on: 03/14/2018 05:43 PM   Modules accepted: Orders

## 2018-03-14 NOTE — Telephone Encounter (Signed)
Copied from [place] [id number]. Topic: Quick Communication - Rx Refill/Question >> Mar 14, 2018 10:56 AM Nils Flack wrote: Medication: pantoprazole (PROTONIX) 40 MG tablet  Has the patient contacted their pharmacy? Yes.   (Agent: If no, request that the patient contact the pharmacy for the refill.) (Agent: If yes, when and what did the pharmacy advise?)  Preferred Pharmacy (with phone number or street name): [email or web address]  The current rx says take 1 tab daily, and pt has been advised by dr to take 1 tab bid.  [phone]   Agent: Please be advised that RX refills may take up to 3 business days. We ask that you follow-up with your pharmacy.

## 2018-03-28 ENCOUNTER — Encounter: Payer: Self-pay | Admitting: Endocrinology

## 2018-03-28 ENCOUNTER — Ambulatory Visit (INDEPENDENT_AMBULATORY_CARE_PROVIDER_SITE_OTHER): Payer: BC Managed Care – PPO | Admitting: Endocrinology

## 2018-03-28 VITALS — BP 118/74 | HR 96 | Ht 65.0 in | Wt 193.0 lb

## 2018-03-28 DIAGNOSIS — E119 Type 2 diabetes mellitus without complications: Secondary | ICD-10-CM

## 2018-03-28 MED ORDER — SITAGLIP PHOS-METFORMIN HCL ER 50-1000 MG PO TB24: 2.0000 | ORAL_TABLET | Freq: Every day | ORAL | 3 refills | Status: DC

## 2018-03-28 NOTE — Patient Instructions (Signed)
check your blood sugar once a day.  vary the time of day when you check, between before the 3 meals, and at bedtime.  also check if you have symptoms of your blood sugar being too high or too low.  please keep a record of the readings and bring it to your next appointment here (or you can bring the meter itself).  You can write it on any piece of paper.  please call us sooner if your blood sugar goes below 70, or if you have a lot of readings over 200.   Please continue the same medications for diabetes.  Please come back for a follow-up appointment in 4-6 months.

## 2018-03-28 NOTE — Progress Notes (Signed)
Subjective:    Patient ID: Claire Irwin, female    DOB: 08-08-1971, 47 y.o.   MRN: [id number]  HPI Pt returns for f/u of diabetes mellitus: DM type: 2 Dx'ed: 9476 Complications: none Therapy: 3 oral meds GDM: never DKA: never Severe hypoglycemia: never.  Pancreatitis: never.  Other: she has never taken insulin; she did not tolerate farxiga (rash).  Interval history: pt states she feels well in general.  She takes meds as rx'ed, and tolerates well.   Past Medical History:  Diagnosis Date  . Allergy   . Asthma   . Diabetes mellitus without complication ([place])   . 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 Fibrocystic breast disease   . GERD (gastroesophageal reflux disease)   . History of endometriosis   . Hyperlipidemia 06/29/2011  . Preeclampsia     Past Surgical History:  Procedure Laterality Date  . ABDOMINAL ADHESION SURGERY  07/2004  . CESAREAN SECTION  02/2004  . DILATION AND CURETTAGE, DIAGNOSTIC / THERAPEUTIC      Social History   Socioeconomic History  . Marital status: Legally Separated    Spouse name: Not on file  . Number of children: Not on file  . Years of education: Not on file  . Highest education level: Not on file  Occupational History  . Not on file  Social Needs  . Financial resource strain: Not on file  . Food insecurity:    Worry: Not on file    Inability: Not on file  . Transportation needs:    Medical: Not on file    Non-medical: Not on file  Tobacco Use  . Smoking status: Never Smoker  . Smokeless tobacco: Never Used  Substance and Sexual Activity  . Alcohol use: No  . Drug use: No  . Sexual activity: Not on file  Lifestyle  . Physical activity:    Days per week: Not on file    Minutes per session: Not on file  . Stress: Not on file  Relationships  . Social connections:    Talks on phone: Not on file    Gets together: Not on file    Attends religious service: Not on file    Active member of club or  organization: Not on file    Attends meetings of clubs or organizations: Not on file    Relationship status: Not on file  . Intimate partner violence:    Fear of current or ex partner: Not on file    Emotionally abused: Not on file    Physically abused: Not on file    Forced sexual activity: Not on file  Other Topics Concern  . Not on file  Social History Narrative  . Not on file    Current Outpatient Medications on File Prior to Visit  Medication Sig Dispense Refill  . albuterol (PROAIR HFA) 108 (90 Base) MCG/ACT inhaler USE 2 PUFFS 4 TIMES DAILY AS NEEDED 8.5 each 11  . aspirin EC 81 MG tablet Take 1 tablet (81 mg total) by mouth daily. 90 tablet 11  . clonazePAM (KLONOPIN) 0.5 MG tablet Take 1 tablet (0.5 mg total) by mouth 2 (two) times daily as needed for anxiety. 60 tablet 1  . cyclobenzaprine (FLEXERIL) 5 MG tablet TAKE 1 TABLET (5 MG TOTAL) BY MOUTH 3 (THREE) TIMES DAILY AS NEEDED FOR MUSCLE SPASMS. 60 tablet 0  . empagliflozin (JARDIANCE) 10 MG TABS tablet Take 10 mg by mouth daily. 30 tablet 11  .  Fluticasone-Salmeterol (ADVAIR DISKUS) 250-50 MCG/DOSE AEPB USE 1 PUFF TWO TIMES DAILY AS NEEDED 180 each 3  . glucose blood (ONETOUCH VERIO) test strip 1 each by Other route daily. And lancets 1/day 100 each 3  . hydrochlorothiazide (HYDRODIURIL) 25 MG tablet Take 0.5 tablets (12.5 mg total) by mouth as needed. 45 tablet 3  . Lancets MISC Use as directed one per day 100 each 11  . Multiple Vitamins-Calcium (ONE-A-DAY WOMENS PO) Take by mouth.    . NONFORMULARY OR COMPOUNDED ITEM [facility]:  Wart Cream - Cimetidine 2%, Deoxy-D-Glucose 0.2%, Fluorouracil-5 5%, Salicylic Acid 16%, apply to affected areas daily. 120 each 2  . norethindrone (AYGESTIN) 5 MG tablet Take 5 mg by mouth daily.  10  . NORETHINDRONE ACETATE PO Take by mouth.      . pantoprazole (PROTONIX) 40 MG tablet Take 1 tablet (40 mg total) by mouth 2 (two) times daily before a meal. 180 tablet 3  . potassium  chloride (KLOR-CON) 8 MEQ tablet Take 1 tablet (8 mEq total) by mouth daily as needed (with HCT use). 90 tablet 3  . prednisoLONE acetate (PRED FORTE) 1 % ophthalmic suspension 1 drop 4 (four) times daily.    . ranitidine (ZANTAC) 150 MG tablet Take 1 tablet (150 mg total) by mouth at bedtime. (Patient not taking: Reported on 03/28/2018) 90 tablet 3  . triamcinolone cream (KENALOG) 0.1 % Apply 1 application topically 3 (three) times daily as needed. For rash 45 g 0   Current Facility-Administered Medications on File Prior to Visit  Medication Dose Route Frequency Provider Last Rate Last Dose  . metFORMIN (GLUCOPHAGE-XR) 24 hr tablet 500 mg  500 mg Oral Q breakfast Biagio Borg, MD        Allergies  Allergen Reactions  . Hydrocodone   . Sulfonamide Derivatives     Family History  Problem Relation Age of Onset  . Diabetes Mother   . Hypertension Mother   . Asthma Mother   . Allergies Mother   . Diabetes Father   . Heart disease Paternal Uncle   . Asthma Maternal Grandmother   . Allergies Maternal Grandmother   . Cancer Paternal Grandmother        Brain and Lung    BP 118/74 (BP Location: Left Arm, Patient Position: Sitting, Cuff Size: Normal)   Pulse 96   Ht 5\' 5"  (1.651 m)   Wt 193 lb (87.5 kg)   SpO2 97%   BMI 32.12 kg/m    Review of Systems She says she has lost weight, due to her efforts.  She denies hypoglycemia.      Objective:   Physical Exam VITAL SIGNS:  See vs page GENERAL: no distress Pulses: foot pulses are intact bilaterally.   MSK: no deformity of the feet or ankles.  CV: no edema of the legs or ankles Skin:  no ulcer on the feet or ankles.  normal color and temp on the feet and ankles.   Neuro: sensation is intact to touch on the feet and ankles.     Lab Results  Component Value Date   HGBA1C 6.4 03/11/2018       Assessment & Plan:  Type 2 DM: well-controlled Weight loss, intentional: pt is advised to continue her efforts  Patient  Instructions  check your blood sugar once a day.  vary the time of day when you check, between before the 3 meals, and at bedtime.  also check if you have symptoms of your blood  sugar being too high or too low.  please keep a record of the readings and bring it to your next appointment here (or you can bring the meter itself).  You can write it on any piece of paper.  please call us sooner if your blood sugar goes below 70, or if you have a lot of readings over 200.   Please continue the same medications for diabetes.  Please come back for a follow-up appointment in 4-6 months.

## 2018-04-04 ENCOUNTER — Telehealth: Payer: Self-pay | Admitting: Endocrinology

## 2018-04-04 MED ORDER — SITAGLIP PHOS-METFORMIN HCL ER 50-1000 MG PO TB24: 1.0000 | ORAL_TABLET | Freq: Every day | ORAL | 3 refills | Status: DC

## 2018-04-04 MED ORDER — EMPAGLIFLOZIN 25 MG PO TABS: 25.0000 mg | ORAL_TABLET | Freq: Every day | ORAL | 11 refills | Status: DC

## 2018-04-04 NOTE — Telephone Encounter (Signed)
Please advise 

## 2018-04-04 NOTE — Telephone Encounter (Signed)
Please make these changes: Reduce janumet to 1/day Increase jardiance to 25 mg qd. D/c HCTZ D/c KCl I have sent prescriptions to your pharmacy

## 2018-04-04 NOTE — Telephone Encounter (Signed)
Potassium pill.  Brand name is K-Dur

## 2018-04-04 NOTE — Telephone Encounter (Signed)
I called patient & she stated that she would make the following changes. If she didn't feel better then she would call back.

## 2018-04-04 NOTE — Telephone Encounter (Signed)
What is Claire?

## 2018-04-04 NOTE — Telephone Encounter (Signed)
Patient stated that Dr Loanne Drilling has her taking new medication Januniva/ Metformin  She is stating her she is still having some stomach pains and would like to know what she should do.  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

## 2018-05-30 ENCOUNTER — Encounter: Payer: Self-pay | Admitting: Internal Medicine

## 2018-05-30 ENCOUNTER — Ambulatory Visit: Payer: BC Managed Care – PPO | Admitting: Internal Medicine

## 2018-05-30 VITALS — BP 116/78 | HR 94 | Temp 98.7°F | Ht 65.0 in | Wt 195.0 lb

## 2018-05-30 DIAGNOSIS — M25512 Pain in left shoulder: Secondary | ICD-10-CM | POA: Insufficient documentation

## 2018-05-30 DIAGNOSIS — J4520 Mild intermittent asthma, uncomplicated: Secondary | ICD-10-CM | POA: Diagnosis not present

## 2018-05-30 DIAGNOSIS — J029 Acute pharyngitis, unspecified: Secondary | ICD-10-CM | POA: Insufficient documentation

## 2018-05-30 DIAGNOSIS — J3501 Chronic tonsillitis: Secondary | ICD-10-CM | POA: Diagnosis not present

## 2018-05-30 MED ORDER — AZITHROMYCIN 250 MG PO TABS: ORAL_TABLET | ORAL | 1 refills | Status: DC

## 2018-05-30 NOTE — Patient Instructions (Signed)
Please take all new medication as prescribed - the antibiotic  You will be contacted regarding the referral for: ENT and Sports Medicine  Please continue all other medications as before, and refills have been done if requested.  Please have the pharmacy call with any other refills you may need.  Please continue your efforts at being more active, low cholesterol diet, and weight control.  Please keep your appointments with your specialists as you may have planned

## 2018-05-30 NOTE — Assessment & Plan Note (Signed)
For ENT referral 

## 2018-05-30 NOTE — Assessment & Plan Note (Signed)
stable overall by history and exam, recent data reviewed with pt, and pt to continue medical treatment as before,  to f/u any worsening symptoms or concerns  

## 2018-05-30 NOTE — Progress Notes (Signed)
Subjective:    Patient ID: Claire Irwin, female    DOB: [birth year], 47 y.o.   MRN: [id number]  HPI   Here with 2-3 days acute onset fever, ST pain, pressure, headache, general weakness and malaise, and scant cough except for hard tonsillar like stone out yesterday, but pt denies chest pain, wheezing, increased sob or doe, orthopnea, PND, increased LE swelling, palpitations, dizziness or syncope.  Also for left shoulder pain mostly anterior, worse to forward elevate and assoc with some swelling but o/w FROM  Sees endo.  Pt denies polydipsia, polyuria, Past Medical History:  Diagnosis Date  . Allergy   . Asthma   . Diabetes mellitus without complication ([place])   . 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 Fibrocystic breast disease   . GERD (gastroesophageal reflux disease)   . History of endometriosis   . Hyperlipidemia 06/29/2011  . Preeclampsia    Past Surgical History:  Procedure Laterality Date  . ABDOMINAL ADHESION SURGERY  07/2004  . CESAREAN SECTION  02/2004  . DILATION AND CURETTAGE, DIAGNOSTIC / THERAPEUTIC      reports that she has never smoked. She has never used smokeless tobacco. She reports that she does not drink alcohol or use drugs. family history includes Allergies in her maternal grandmother and mother; Asthma in her maternal grandmother and mother; Cancer in her paternal grandmother; Diabetes in her father and mother; Heart disease in her paternal uncle; Hypertension in her mother. Allergies  Allergen Reactions  . Hydrocodone   . Sulfonamide Derivatives    Current Outpatient Medications on File Prior to Visit  Medication Sig Dispense Refill  . albuterol (PROAIR HFA) 108 (90 Base) MCG/ACT inhaler USE 2 PUFFS 4 TIMES DAILY AS NEEDED 8.5 each 11  . aspirin EC 81 MG tablet Take 1 tablet (81 mg total) by mouth daily. 90 tablet 11  . clonazePAM (KLONOPIN) 0.5 MG tablet Take 1 tablet (0.5 mg total) by mouth 2 (two) times daily as needed for anxiety. 60  tablet 1  . cyclobenzaprine (FLEXERIL) 5 MG tablet TAKE 1 TABLET (5 MG TOTAL) BY MOUTH 3 (THREE) TIMES DAILY AS NEEDED FOR MUSCLE SPASMS. 60 tablet 0  . empagliflozin (JARDIANCE) 25 MG TABS tablet Take 25 mg by mouth daily. 30 tablet 11  . Fluticasone-Salmeterol (ADVAIR DISKUS) 250-50 MCG/DOSE AEPB USE 1 PUFF TWO TIMES DAILY AS NEEDED 180 each 3  . glucose blood (ONETOUCH VERIO) test strip 1 each by Other route daily. And lancets 1/day 100 each 3  . Lancets MISC Use as directed one per day 100 each 11  . Multiple Vitamins-Calcium (ONE-A-DAY WOMENS PO) Take by mouth.    . NONFORMULARY OR COMPOUNDED ITEM [facility]:  Wart Cream - Cimetidine 2%, Deoxy-D-Glucose 0.2%, Fluorouracil-5 5%, Salicylic Acid 24%, apply to affected areas daily. 120 each 2  . norethindrone (AYGESTIN) 5 MG tablet Take 5 mg by mouth daily.  10  . NORETHINDRONE ACETATE PO Take by mouth.      . pantoprazole (PROTONIX) 40 MG tablet Take 1 tablet (40 mg total) by mouth 2 (two) times daily before a meal. 180 tablet 3  . prednisoLONE acetate (PRED FORTE) 1 % ophthalmic suspension 1 drop 4 (four) times daily.    . ranitidine (ZANTAC) 150 MG tablet Take 1 tablet (150 mg total) by mouth at bedtime. 90 tablet 3  . SitaGLIPtin-MetFORMIN HCl (JANUMET XR) 50-1000 MG TB24 Take 1 tablet by mouth daily. 90 tablet 3  . triamcinolone cream (  KENALOG) 0.1 % Apply 1 application topically 3 (three) times daily as needed. For rash 45 g 0   Current Facility-Administered Medications on File Prior to Visit  Medication Dose Route Frequency Provider Last Rate Last Dose  . metFORMIN (GLUCOPHAGE-XR) 24 hr tablet 500 mg  500 mg Oral Q breakfast Biagio Borg, MD       Review of Systems  Constitutional: Negative for other unusual diaphoresis or sweats HENT: Negative for ear discharge or swelling Eyes: Negative for other worsening visual disturbances Respiratory: Negative for stridor or other swelling  Gastrointestinal: Negative for worsening  distension or other blood Genitourinary: Negative for retention or other urinary change Musculoskeletal: Negative for other MSK pain or swelling Skin: Negative for color change or other new lesions Neurological: Negative for worsening tremors and other numbness  Psychiatric/Behavioral: Negative for worsening agitation or other fatigue All other system neg per pt    Objective:   Physical Exam BP 116/78   Pulse 94   Temp 98.7 F (37.1 C) (Oral)   Ht 5\' 5"  (1.651 m)   Wt 195 lb (88.5 kg)   SpO2 98%   BMI 32.45 kg/m  VS noted, mild ill Constitutional: Pt appears in NAD HENT: Head: NCAT.  Right Ear: External ear normal.  Left Ear: External ear normal.  Eyes: . Pupils are equal, round, and reactive to light. Conjunctivae and EOM are normal Bilat tm's with mild erythema.  Max sinus areas non tender.  Pharynx with mild erythema, no exudate but with acute on chronic tonsillar large right > left Nose: without d/c or deformity Neck: Neck supple. Gross normal ROM Cardiovascular: Normal rate and regular rhythm.   Pulmonary/Chest: Effort normal and breath sounds without rales or wheezing.  Tender to left bicipital tendon shoulder insertion area Neurological: Pt is alert. At baseline orientation, motor grossly intact Skin: Skin is warm. No rashes, other new lesions, no LE edema Psychiatric: Pt behavior is normal without agitation  No other exam findings Lab Results  Component Value Date   WBC 6.2 03/11/2018   HGB 16.0 (H) 03/11/2018   HCT 46.6 (H) 03/11/2018   PLT 269.0 03/11/2018   GLUCOSE 128 (H) 03/11/2018   CHOL 160 03/11/2018   TRIG 88.0 03/11/2018   HDL 47.80 03/11/2018   LDLCALC 95 03/11/2018   ALT 24 03/11/2018   AST 19 03/11/2018   NA 141 03/11/2018   K 3.9 03/11/2018   CL 105 03/11/2018   CREATININE 1.24 (H) 03/11/2018   BUN 11 03/11/2018   CO2 25 03/11/2018   TSH 1.46 03/11/2018   HGBA1C 6.4 03/11/2018   MICROALBUR <0.7 03/11/2018       Assessment & Plan:

## 2018-05-30 NOTE — Assessment & Plan Note (Signed)
Mild to mod, for antibx course,  to f/u any worsening symptoms or concerns 

## 2018-05-30 NOTE — Assessment & Plan Note (Signed)
C/w possible bicipital tendonitis, for sport med referral

## 2018-06-13 NOTE — Progress Notes (Signed)
[name] [address] [place], [place] [address] Phone: [phone] Subjective:    I'm seeing this patient by the request  of:  Biagio Borg, MD   CC: Left shoulder pain  MWU:[id number]  Claire Irwin is a 47 y.o. female coming in with complaint of left shoulder pain.  Patient does have pain.  Patient is been going on for quite some time.  Multiple months.  Waking her up at night.  Rates the severity of pain is 9 out of 10.  Patient does not remember any injury recently.  Past medical history is significant for a broken clavicle at the age of 47 no radiation down the arm or any numbness.  Has had difficulty with such things as dressing secondary to the pain.      Past Medical History:  Diagnosis Date  . Allergy   . Asthma   . Diabetes mellitus without complication ([place])   . 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 Fibrocystic breast disease   . GERD (gastroesophageal reflux disease)   . History of endometriosis   . Hyperlipidemia 06/29/2011  . Preeclampsia    Past Surgical History:  Procedure Laterality Date  . ABDOMINAL ADHESION SURGERY  07/2004  . CESAREAN SECTION  02/2004  . DILATION AND CURETTAGE, DIAGNOSTIC / THERAPEUTIC     Social History   Socioeconomic History  . Marital status: Legally Separated    Spouse name: Not on file  . Number of children: Not on file  . Years of education: Not on file  . Highest education level: Not on file  Occupational History  . Not on file  Social Needs  . Financial resource strain: Not on file  . Food insecurity:    Worry: Not on file    Inability: Not on file  . Transportation needs:    Medical: Not on file    Non-medical: Not on file  Tobacco Use  . Smoking status: Never Smoker  . Smokeless tobacco: Never Used  Substance and Sexual Activity  . Alcohol use: No  . Drug use: No  . Sexual activity: Not on file  Lifestyle  . Physical activity:    Days per week: Not on file     Minutes per session: Not on file  . Stress: Not on file  Relationships  . Social connections:    Talks on phone: Not on file    Gets together: Not on file    Attends religious service: Not on file    Active member of club or organization: Not on file    Attends meetings of clubs or organizations: Not on file    Relationship status: Not on file  Other Topics Concern  . Not on file  Social History Narrative  . Not on file   Allergies  Allergen Reactions  . Hydrocodone   . Sulfonamide Derivatives    Family History  Problem Relation Age of Onset  . Diabetes Mother   . Hypertension Mother   . Asthma Mother   . Allergies Mother   . Diabetes Father   . Heart disease Paternal Uncle   . Asthma Maternal Grandmother   . Allergies Maternal Grandmother   . Cancer Paternal Grandmother        Brain and Lung    Current Outpatient Medications (Endocrine & Metabolic):  .  empagliflozin (JARDIANCE) 25 MG TABS tablet, Take 25 mg by mouth daily. .  norethindrone (AYGESTIN) 5  MG tablet, Take 5 mg by mouth daily. .  NORETHINDRONE ACETATE PO, Take by mouth.   .  SitaGLIPtin-MetFORMIN HCl (JANUMET XR) 50-1000 MG TB24, Take 1 tablet by mouth daily.  Current Facility-Administered Medications (Endocrine & Metabolic):  .  metFORMIN (GLUCOPHAGE-XR) 24 hr tablet 500 mg    Current Outpatient Medications (Respiratory):  .  albuterol (PROAIR HFA) 108 (90 Base) MCG/ACT inhaler, USE 2 PUFFS 4 TIMES DAILY AS NEEDED .  Fluticasone-Salmeterol (ADVAIR DISKUS) 250-50 MCG/DOSE AEPB, USE 1 PUFF TWO TIMES DAILY AS NEEDED   Current Outpatient Medications (Analgesics):  .  aspirin EC 81 MG tablet, Take 1 tablet (81 mg total) by mouth daily.     Current Outpatient Medications (Other):  .  clonazePAM (KLONOPIN) 0.5 MG tablet, Take 1 tablet (0.5 mg total) by mouth 2 (two) times daily as needed for anxiety. .  cyclobenzaprine (FLEXERIL) 5 MG tablet, TAKE 1 TABLET (5 MG TOTAL) BY MOUTH 3 (THREE) TIMES  DAILY AS NEEDED FOR MUSCLE SPASMS. Marland Kitchen  glucose blood (ONETOUCH VERIO) test strip, 1 each by Other route daily. And lancets 1/day .  Lancets MISC, Use as directed one per day .  Multiple Vitamins-Calcium (ONE-A-DAY WOMENS PO), Take by mouth. .  NONFORMULARY OR COMPOUNDED ITEM, [facility]:  Wart Cream - Cimetidine 2%, Deoxy-D-Glucose 0.2%, Fluorouracil-5 5%, Salicylic Acid 26%, apply to affected areas daily. .  pantoprazole (PROTONIX) 40 MG tablet, Take 1 tablet (40 mg total) by mouth 2 (two) times daily before a meal. .  prednisoLONE acetate (PRED FORTE) 1 % ophthalmic suspension, 1 drop 4 (four) times daily. .  ranitidine (ZANTAC) 150 MG tablet, Take 1 tablet (150 mg total) by mouth at bedtime. .  triamcinolone cream (KENALOG) 0.1 %, Apply 1 application topically 3 (three) times daily as needed. For rash .  Diclofenac Sodium (PENNSAID) 2 % SOLN, Place 2 g onto the skin 2 (two) times daily. .  Vitamin D, Ergocalciferol, (DRISDOL) 50000 units CAPS capsule, Take 1 capsule (50,000 Units total) by mouth every 7 (seven) days.     Past medical history, social, surgical and family history all reviewed in electronic medical record.  No pertanent information unless stated regarding to the chief complaint.   Review of Systems:  No headache, visual changes, nausea, vomiting, diarrhea, constipation, dizziness, abdominal pain, skin rash, fevers, chills, night sweats, weight loss, swollen lymph nodes, body aches, joint swelling, muscle aches, chest pain, shortness of breath, mood changes.   Objective  Blood pressure 100/68, pulse 98, resp. rate 18, weight 193 lb (87.5 kg), SpO2 98 %. \]  General: No apparent distress alert and oriented x3 mood and affect normal, dressed appropriately.  HEENT: Pupils equal, extraocular movements intact  Respiratory: Patient's speak in full sentences and does not appear short of breath  Cardiovascular: No lower extremity edema, non tender, no erythema  Skin: Warm dry  intact with no signs of infection or rash on extremities or on axial skeleton.  Abdomen: Soft nontender  Neuro: Cranial nerves II through XII are intact, neurovascularly intact in all extremities with 2+ DTRs and 2+ pulses.  Lymph: No lymphadenopathy of posterior or anterior cervical chain or axillae bilaterally.  Gait normal with good balance and coordination.  MSK:  Non tender with full range of motion and good stability and symmetric strength and tone of , elbows, wrist, hip, knee and ankles bilaterally.    Shoulder: left Inspection reveals no abnormalities, atrophy or asymmetry. Palpation is normal with no tenderness over AC joint or bicipital  groove. Decreased range of motion in all planes by 5 to 10 degrees Rotator cuff strength normal throughout. signs of impingement with positive Neer and Hawkin's tests, but negative empty can sign. Speeds and Yergason's tests normal. No labral pathology noted with negative Obrien's, negative clunk and good stability. Normal scapular function observed. No painful arc and no drop arm sign. No apprehension sign  MSK US performed of: left This study was ordered, performed, and interpreted by Charlann Boxer D.O.  Shoulder:   Supraspinatus:  Appears normal on long and transverse views, Bursal bulge seen with shoulder abduction on impingement view.  Thickening of the capsule noted Infraspinatus:  Appears normal on long and transverse views. Significant increase in Doppler flow Subscapularis:  Appears normal on long and transverse views. Positive bursa Teres Minor:  Appears normal on long and transverse views. AC joint:  Capsule undistended, no geyser sign.  Calcific changes Glenohumeral Joint:  Appears normal without effusion. Glenoid Labrum:  Intact without visualized tears. Biceps Tendon:  Appears normal on long and transverse views, no fraying of tendon, tendon located in intertubercular groove, no subluxation with shoulder internal or external  rotation.  Impression: Subacromial bursitis possible adhesive capsulitis  Procedure: Real-time Ultrasound Guided Injection of left glenohumeral joint Device: GE Logiq E  Ultrasound guided injection is preferred based studies that show increased duration, increased effect, greater accuracy, decreased procedural pain, increased response rate with ultrasound guided versus blind injection.  Verbal informed consent obtained.  Time-out conducted.  Noted no overlying erythema, induration, or other signs of local infection.  Skin prepped in a sterile fashion.  Local anesthesia: Topical Ethyl chloride.  With sterile technique and under real time ultrasound guidance:  Joint visualized.  23g 1  inch needle inserted posterior approach. Pictures taken for needle placement. Patient did have injection of 2 cc of 1% lidocaine, 2 cc of 0.5% Marcaine, and 1.0 cc of Kenalog 40 mg/dL. Completed without difficulty  Pain immediately resolved suggesting accurate placement of the medication.  Advised to call if fevers/chills, erythema, induration, drainage, or persistent bleeding.  Images permanently stored and available for review in the ultrasound unit.  Impression: Technically successful ultrasound guided injection.   [id number]; 15 additional minutes spent for Therapeutic exercises as stated in above notes.  This included exercises focusing on stretching, strengthening, with significant focus on eccentric aspects.   Long term goals include an improvement in range of motion, strength, endurance as well as avoiding reinjury. Patient's frequency would include in 1-2 times a day, 3-5 times a week for a duration of 6-12 weeks. Shoulder Exercises that included:  Basic scapular stabilization to include adduction and depression of scapula Scaption, focusing on proper movement and good control Internal and External rotation utilizing a theraband, with elbow tucked at side entire time Rows with theraband   Proper technique  shown and discussed handout in great detail with ATC.  All questions were discussed and answered.     Impression and Recommendations:     This case required medical decision making of moderate complexity. The above documentation has been reviewed and is accurate and complete Lyndal Pulley, DO       Note: This dictation was prepared with Dragon dictation along with smaller phrase technology. Any transcriptional errors that result from this process are unintentional.

## 2018-06-14 ENCOUNTER — Encounter: Payer: Self-pay | Admitting: Family Medicine

## 2018-06-14 ENCOUNTER — Ambulatory Visit (INDEPENDENT_AMBULATORY_CARE_PROVIDER_SITE_OTHER): Payer: BC Managed Care – PPO | Admitting: Family Medicine

## 2018-06-14 ENCOUNTER — Ambulatory Visit: Payer: Self-pay

## 2018-06-14 VITALS — BP 100/68 | HR 98 | Resp 18 | Wt 193.0 lb

## 2018-06-14 DIAGNOSIS — M25512 Pain in left shoulder: Secondary | ICD-10-CM | POA: Diagnosis not present

## 2018-06-14 DIAGNOSIS — E11618 Type 2 diabetes mellitus with other diabetic arthropathy: Secondary | ICD-10-CM

## 2018-06-14 DIAGNOSIS — M7502 Adhesive capsulitis of left shoulder: Secondary | ICD-10-CM

## 2018-06-14 MED ORDER — DICLOFENAC SODIUM 2 % TD SOLN: 2.0000 g | Freq: Two times a day (BID) | TRANSDERMAL | 3 refills | Status: DC

## 2018-06-14 MED ORDER — VITAMIN D (ERGOCALCIFEROL) 1.25 MG (50000 UNIT) PO CAPS: 50000.0000 [IU] | ORAL_CAPSULE | ORAL | 0 refills | Status: DC

## 2018-06-14 NOTE — Patient Instructions (Addendum)
Good to see you.  Ice 20 minutes 2 times daily. Usually after activity and before bed. Exercises 3 times a week.  pennsaid pinkie amount topically 2 times daily as needed.  Try to move the shoulder as much as you can  Once weekly vitamin D for 12 weeks Keep control of your blood sugars See me again in 4-6 weeks

## 2018-06-14 NOTE — Assessment & Plan Note (Signed)
Patient given injection and tolerated the procedure well.  Discussed icing regimen and home exercise.  Discussed which activities doing which wants to avoid.  Patient will follow-up with me again in 4 to 8 weeks.

## 2018-06-17 DIAGNOSIS — J358 Other chronic diseases of tonsils and adenoids: Secondary | ICD-10-CM | POA: Insufficient documentation

## 2018-06-21 ENCOUNTER — Ambulatory Visit: Payer: BC Managed Care – PPO | Admitting: Family Medicine

## 2018-07-15 ENCOUNTER — Other Ambulatory Visit: Payer: Self-pay

## 2018-07-15 MED ORDER — GLUCOSE BLOOD VI STRP: 1.0000 | ORAL_STRIP | Freq: Every day | 3 refills | Status: DC

## 2018-07-15 MED ORDER — ONETOUCH ULTRASOFT LANCETS MISC: 12 refills | Status: DC

## 2018-07-15 NOTE — Telephone Encounter (Signed)
Received notification from [facility] re: pt request to refill One Touch lancets and test strips. Request authorized and refill sent as requested.

## 2018-07-18 ENCOUNTER — Other Ambulatory Visit: Payer: Self-pay

## 2018-07-18 ENCOUNTER — Telehealth: Payer: Self-pay | Admitting: Endocrinology

## 2018-07-18 MED ORDER — GLUCOSE BLOOD VI STRP: ORAL_STRIP | 12 refills | Status: DC

## 2018-07-18 MED ORDER — ONETOUCH DELICA LANCETS 33G MISC: 1 refills | Status: DC

## 2018-07-18 NOTE — Telephone Encounter (Signed)
Per patient went to pharmacy to pick up her lancets and test strips and the items filled were different from what she normally gets.  Patient needs either clarification on why the RX is different or needs correct items called in.  She states the correct items on One Touch Ultra Blue Strips 25ct or 50ct and One Touch Delica Ultrafine 33 gauge   [facility] [address] [facility]

## 2018-07-18 NOTE — Telephone Encounter (Signed)
Previous Rx's have been d/c'd and re-written per her request and per authorization of Dr. Loanne Drilling.

## 2018-07-18 NOTE — Telephone Encounter (Signed)
Received notification from pt re: request to change Rx from One Touch Verio test strips and One Touch Ultrasoft to One Touch UF 33 gauge lancets and Once Touch Blue test strips. Request authorized and refill sent as requested.

## 2018-08-04 ENCOUNTER — Encounter: Payer: Self-pay | Admitting: Endocrinology

## 2018-08-04 ENCOUNTER — Ambulatory Visit (INDEPENDENT_AMBULATORY_CARE_PROVIDER_SITE_OTHER): Payer: BC Managed Care – PPO | Admitting: Endocrinology

## 2018-08-04 VITALS — BP 128/88 | HR 110 | Ht 65.0 in | Wt 191.8 lb

## 2018-08-04 DIAGNOSIS — E119 Type 2 diabetes mellitus without complications: Secondary | ICD-10-CM

## 2018-08-04 DIAGNOSIS — Z23 Encounter for immunization: Secondary | ICD-10-CM | POA: Diagnosis not present

## 2018-08-04 LAB — POCT GLYCOSYLATED HEMOGLOBIN (HGB A1C): Hemoglobin A1C: 5.8 % — AB (ref 4.0–5.6)

## 2018-08-04 MED ORDER — EMPAGLIFLOZIN 10 MG PO TABS: 10.0000 mg | ORAL_TABLET | Freq: Every day | ORAL | 11 refills | Status: DC

## 2018-08-04 NOTE — Patient Instructions (Signed)
Please reduce the Jardiance to 10 mg daily, and: Please continue the same other diabetes medications. check your blood sugar once a day.  vary the time of day when you check, between before the 3 meals, and at bedtime.  also check if you have symptoms of your blood sugar being too high or too low.  please keep a record of the readings and bring it to your next appointment here (or you can bring the meter itself).  You can write it on any piece of paper.  please call us sooner if your blood sugar goes below 70, or if you have a lot of readings over 200. Please come back for a follow-up appointment in 4-6 months.

## 2018-08-04 NOTE — Progress Notes (Signed)
Subjective:    Patient ID: Claire Irwin, female    DOB: Dec 12, 1970, 47 y.o.   MRN: [id number]  HPI Pt returns for f/u of diabetes mellitus: DM type: 2 Dx'ed: 5784 Complications: renal insuff Therapy: 3 oral meds GDM: never DKA: never Severe hypoglycemia: never.  Pancreatitis: never.  Other: she has never taken insulin; she did not tolerate farxiga (rash).   Interval history: pt states she feels well in general.  She takes meds as rx'ed, and tolerates well.  She says cbg's are in the low-100's.   Past Medical History:  Diagnosis Date  . Allergy   . Asthma   . Diabetes mellitus without complication ([place])   . 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 Fibrocystic breast disease   . GERD (gastroesophageal reflux disease)   . History of endometriosis   . Hyperlipidemia 06/29/2011  . Preeclampsia     Past Surgical History:  Procedure Laterality Date  . ABDOMINAL ADHESION SURGERY  07/2004  . CESAREAN SECTION  02/2004  . DILATION AND CURETTAGE, DIAGNOSTIC / THERAPEUTIC      Social History   Socioeconomic History  . Marital status: Legally Separated    Spouse name: Not on file  . Number of children: Not on file  . Years of education: Not on file  . Highest education level: Not on file  Occupational History  . Not on file  Social Needs  . Financial resource strain: Not on file  . Food insecurity:    Worry: Not on file    Inability: Not on file  . Transportation needs:    Medical: Not on file    Non-medical: Not on file  Tobacco Use  . Smoking status: Never Smoker  . Smokeless tobacco: Never Used  Substance and Sexual Activity  . Alcohol use: No  . Drug use: No  . Sexual activity: Not on file  Lifestyle  . Physical activity:    Days per week: Not on file    Minutes per session: Not on file  . Stress: Not on file  Relationships  . Social connections:    Talks on phone: Not on file    Gets together: Not on file    Attends religious service:  Not on file    Active member of club or organization: Not on file    Attends meetings of clubs or organizations: Not on file    Relationship status: Not on file  . Intimate partner violence:    Fear of current or ex partner: Not on file    Emotionally abused: Not on file    Physically abused: Not on file    Forced sexual activity: Not on file  Other Topics Concern  . Not on file  Social History Narrative  . Not on file    Current Outpatient Medications on File Prior to Visit  Medication Sig Dispense Refill  . albuterol (PROAIR HFA) 108 (90 Base) MCG/ACT inhaler USE 2 PUFFS 4 TIMES DAILY AS NEEDED 8.5 each 11  . aspirin EC 81 MG tablet Take 1 tablet (81 mg total) by mouth daily. 90 tablet 11  . clonazePAM (KLONOPIN) 0.5 MG tablet Take 1 tablet (0.5 mg total) by mouth 2 (two) times daily as needed for anxiety. 60 tablet 1  . cyclobenzaprine (FLEXERIL) 5 MG tablet TAKE 1 TABLET (5 MG TOTAL) BY MOUTH 3 (THREE) TIMES DAILY AS NEEDED FOR MUSCLE SPASMS. 60 tablet 0  . Diclofenac Sodium (PENNSAID) 2 %  SOLN Place 2 g onto the skin 2 (two) times daily. 112 g 3  . Fluticasone-Salmeterol (ADVAIR DISKUS) 250-50 MCG/DOSE AEPB USE 1 PUFF TWO TIMES DAILY AS NEEDED 180 each 3  . glucose blood (ONE TOUCH ULTRA TEST) test strip Use to monitor glucose levels once per day 50 each 12  . Multiple Vitamins-Calcium (ONE-A-DAY WOMENS PO) Take by mouth.    . NONFORMULARY OR COMPOUNDED ITEM [facility]:  Wart Cream - Cimetidine 2%, Deoxy-D-Glucose 0.2%, Fluorouracil-5 5%, Salicylic Acid 76%, apply to affected areas daily. 120 each 2  . norethindrone (AYGESTIN) 5 MG tablet Take 5 mg by mouth daily.  10  . NORETHINDRONE ACETATE PO Take by mouth.      Glory Rosebush DELICA LANCETS 28B MISC Use to monitor glucose levels qd 100 each 1  . pantoprazole (PROTONIX) 40 MG tablet Take 1 tablet (40 mg total) by mouth 2 (two) times daily before a meal. 180 tablet 3  . prednisoLONE acetate (PRED FORTE) 1 % ophthalmic  suspension 1 drop 4 (four) times daily.    . ranitidine (ZANTAC) 150 MG tablet Take 1 tablet (150 mg total) by mouth at bedtime. 90 tablet 3  . SitaGLIPtin-MetFORMIN HCl (JANUMET XR) 50-1000 MG TB24 Take 1 tablet by mouth daily. 90 tablet 3  . triamcinolone cream (KENALOG) 0.1 % Apply 1 application topically 3 (three) times daily as needed. For rash 45 g 0  . Vitamin D, Ergocalciferol, (DRISDOL) 50000 units CAPS capsule Take 1 capsule (50,000 Units total) by mouth every 7 (seven) days. 12 capsule 0   Current Facility-Administered Medications on File Prior to Visit  Medication Dose Route Frequency Provider Last Rate Last Dose  . metFORMIN (GLUCOPHAGE-XR) 24 hr tablet 500 mg  500 mg Oral Q breakfast Biagio Borg, MD        Allergies  Allergen Reactions  . Hydrocodone   . Sulfonamide Derivatives     Family History  Problem Relation Age of Onset  . Diabetes Mother   . Hypertension Mother   . Asthma Mother   . Allergies Mother   . Diabetes Father   . Heart disease Paternal Uncle   . Asthma Maternal Grandmother   . Allergies Maternal Grandmother   . Cancer Paternal Grandmother        Brain and Lung    BP 128/88 (BP Location: Right Arm, Patient Position: Sitting, Cuff Size: Large)   Pulse (!) 110   Ht 5\' 5"  (1.651 m)   Wt 191 lb 12.8 oz (87 kg)   SpO2 97%   BMI 31.92 kg/m    Review of Systems She has lost a few lbs.      Objective:   Physical Exam VITAL SIGNS:  See vs page GENERAL: no distress Pulses: dorsalis pedis intact bilat.   MSK: no deformity of the feet CV: trace bilat leg edema Skin:  no ulcer on the feet.  normal color and temp on the feet.  Neuro: sensation is intact to touch on the feet.    Lab Results  Component Value Date   HGBA1C 5.8 (A) 08/04/2018   Lab Results  Component Value Date   CREATININE 1.24 (H) 03/11/2018   BUN 11 03/11/2018   NA 141 03/11/2018   K 3.9 03/11/2018   CL 105 03/11/2018   CO2 25 03/11/2018    Lab Results  Component  Value Date   TSH 1.46 03/11/2018       Assessment & Plan:  Type 2 DM: well-controlled.  Renal  insuff, new, uncertain etiology.  Edema: this limits rx options.   Patient Instructions  Please reduce the Jardiance to 10 mg daily, and: Please continue the same other diabetes medications. check your blood sugar once a day.  vary the time of day when you check, between before the 3 meals, and at bedtime.  also check if you have symptoms of your blood sugar being too high or too low.  please keep a record of the readings and bring it to your next appointment here (or you can bring the meter itself).  You can write it on any piece of paper.  please call us sooner if your blood sugar goes below 70, or if you have a lot of readings over 200. Please come back for a follow-up appointment in 4-6 months.

## 2018-08-08 ENCOUNTER — Ambulatory Visit: Payer: BC Managed Care – PPO | Admitting: Family Medicine

## 2018-08-18 ENCOUNTER — Encounter: Payer: Self-pay | Admitting: Family Medicine

## 2018-08-18 ENCOUNTER — Ambulatory Visit (INDEPENDENT_AMBULATORY_CARE_PROVIDER_SITE_OTHER): Payer: BC Managed Care – PPO | Admitting: Family Medicine

## 2018-08-18 DIAGNOSIS — M7502 Adhesive capsulitis of left shoulder: Secondary | ICD-10-CM | POA: Diagnosis not present

## 2018-08-18 DIAGNOSIS — E11618 Type 2 diabetes mellitus with other diabetic arthropathy: Secondary | ICD-10-CM

## 2018-08-18 NOTE — Assessment & Plan Note (Signed)
At the moment stable.  Patient has been fairly noncompliant on the exercise.  Patient declined formal physical therapy.  Discussed icing regimen and home exercise.  Follow-up again in 4 to 6 weeks.

## 2018-08-18 NOTE — Patient Instructions (Signed)
Good to see you  Ice is your friend On wall with heels, butt shoulder and head touching for a goal of 5 minutes daily  Stay active Once you are done with once weekly vitamin D then 4000IU daily for 4 weeks then 2000IU daily thereaftyer See me again in 6ish weeks

## 2018-08-18 NOTE — Progress Notes (Signed)
[facility] [address], [place] [address] Phone: [phone] Subjective:   Fontaine No, am serving as a scribe for Dr. Hulan Saas. CC: Left shoulder pain follow-up  [id number]  Claire Irwin is a 47 y.o. female coming in with complaint of left shoulder pain. She hasn't been doing the exercises. Pain increases with stress. Did have relief from injection last visit. Overall feels she has improved. Did initially have frozen shoulder.  Patient states it is not getting any worse.  Still approximately 65 to 75% better than what it was prior to the injection.     Past Medical History:  Diagnosis Date  . Allergy   . Asthma   . Diabetes mellitus without complication ([place])   . 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 Fibrocystic breast disease   . GERD (gastroesophageal reflux disease)   . History of endometriosis   . Hyperlipidemia 06/29/2011  . Preeclampsia    Past Surgical History:  Procedure Laterality Date  . ABDOMINAL ADHESION SURGERY  07/2004  . CESAREAN SECTION  02/2004  . DILATION AND CURETTAGE, DIAGNOSTIC / THERAPEUTIC     Social History   Socioeconomic History  . Marital status: Legally Separated    Spouse name: Not on file  . Number of children: Not on file  . Years of education: Not on file  . Highest education level: Not on file  Occupational History  . Not on file  Social Needs  . Financial resource strain: Not on file  . Food insecurity:    Worry: Not on file    Inability: Not on file  . Transportation needs:    Medical: Not on file    Non-medical: Not on file  Tobacco Use  . Smoking status: Never Smoker  . Smokeless tobacco: Never Used  Substance and Sexual Activity  . Alcohol use: No  . Drug use: No  . Sexual activity: Not on file  Lifestyle  . Physical activity:    Days per week: Not on file    Minutes per session: Not on file  . Stress: Not on file  Relationships  . Social  connections:    Talks on phone: Not on file    Gets together: Not on file    Attends religious service: Not on file    Active member of club or organization: Not on file    Attends meetings of clubs or organizations: Not on file    Relationship status: Not on file  Other Topics Concern  . Not on file  Social History Narrative  . Not on file   Allergies  Allergen Reactions  . Hydrocodone   . Sulfonamide Derivatives    Family History  Problem Relation Age of Onset  . Diabetes Mother   . Hypertension Mother   . Asthma Mother   . Allergies Mother   . Diabetes Father   . Heart disease Paternal Uncle   . Asthma Maternal Grandmother   . Allergies Maternal Grandmother   . Cancer Paternal Grandmother        Brain and Lung    Current Outpatient Medications (Endocrine & Metabolic):  .  empagliflozin (JARDIANCE) 10 MG TABS tablet, Take 10 mg by mouth daily. .  norethindrone (AYGESTIN) 5 MG tablet, Take 5 mg by mouth daily. .  NORETHINDRONE ACETATE PO, Take by mouth.   .  SitaGLIPtin-MetFORMIN HCl (JANUMET XR) 50-1000 MG TB24, Take 1 tablet by mouth daily.  Current Facility-Administered Medications (Endocrine & Metabolic):  .  metFORMIN (GLUCOPHAGE-XR) 24 hr tablet 500 mg    Current Outpatient Medications (Respiratory):  .  albuterol (PROAIR HFA) 108 (90 Base) MCG/ACT inhaler, USE 2 PUFFS 4 TIMES DAILY AS NEEDED .  Fluticasone-Salmeterol (ADVAIR DISKUS) 250-50 MCG/DOSE AEPB, USE 1 PUFF TWO TIMES DAILY AS NEEDED   Current Outpatient Medications (Analgesics):  .  aspirin EC 81 MG tablet, Take 1 tablet (81 mg total) by mouth daily.     Current Outpatient Medications (Other):  .  clonazePAM (KLONOPIN) 0.5 MG tablet, Take 1 tablet (0.5 mg total) by mouth 2 (two) times daily as needed for anxiety. .  cyclobenzaprine (FLEXERIL) 5 MG tablet, TAKE 1 TABLET (5 MG TOTAL) BY MOUTH 3 (THREE) TIMES DAILY AS NEEDED FOR MUSCLE SPASMS. .  Diclofenac Sodium (PENNSAID) 2 % SOLN, Place 2 g  onto the skin 2 (two) times daily. Marland Kitchen  glucose blood (ONE TOUCH ULTRA TEST) test strip, Use to monitor glucose levels once per day .  Multiple Vitamins-Calcium (ONE-A-DAY WOMENS PO), Take by mouth. .  NONFORMULARY OR COMPOUNDED ITEM, [facility]:  Wart Cream - Cimetidine 2%, Deoxy-D-Glucose 0.2%, Fluorouracil-5 5%, Salicylic Acid 38%, apply to affected areas daily. Glory Rosebush DELICA LANCETS 25K MISC, Use to monitor glucose levels qd .  pantoprazole (PROTONIX) 40 MG tablet, Take 1 tablet (40 mg total) by mouth 2 (two) times daily before a meal. .  prednisoLONE acetate (PRED FORTE) 1 % ophthalmic suspension, 1 drop 4 (four) times daily. .  ranitidine (ZANTAC) 150 MG tablet, Take 1 tablet (150 mg total) by mouth at bedtime. .  triamcinolone cream (KENALOG) 0.1 %, Apply 1 application topically 3 (three) times daily as needed. For rash .  Vitamin D, Ergocalciferol, (DRISDOL) 50000 units CAPS capsule, Take 1 capsule (50,000 Units total) by mouth every 7 (seven) days.     Past medical history, social, surgical and family history all reviewed in electronic medical record.  No pertanent information unless stated regarding to the chief complaint.   Review of Systems:  No headache, visual changes, nausea, vomiting, diarrhea, constipation, dizziness, abdominal pain, skin rash, fevers, chills, night sweats, weight loss, swollen lymph nodes, body aches, joint swelling, muscle aches, chest pain, shortness of breath, mood changes.   Objective  Blood pressure 106/70, pulse 96, height 5\' 5"  (1.651 m), weight 192 lb (87.1 kg), SpO2 99 %.    General: No apparent distress alert and oriented x3 mood and affect normal, dressed appropriately.  HEENT: Pupils equal, extraocular movements intact  Respiratory: Patient's speak in full sentences and does not appear short of breath  Cardiovascular: No lower extremity edema, non tender, no erythema  Skin: Warm dry intact with no signs of infection or rash on  extremities or on axial skeleton.  Abdomen: Soft nontender  Neuro: Cranial nerves II through XII are intact, neurovascularly intact in all extremities with 2+ DTRs and 2+ pulses.  Lymph: No lymphadenopathy of posterior or anterior cervical chain or axillae bilaterally.  Gait normal with good balance and coordination.  MSK:  Non tender with full range of motion and good stability and symmetric strength and tone of elbows, wrist, hip, knee and ankles bilaterally.  Left shoulder exam shows near full range of motion but lacks the last 5 degrees of extension and still only has internal range of motion to L4 compared to T8 on the contralateral side 25 strength of the rotator cuff.  Negative O'Brien's.  Minimal impingement signs   Impression  and Recommendations:     The above documentation has been reviewed and is accurate and complete Lyndal Pulley, DO       Note: This dictation was prepared with Dragon dictation along with smaller phrase technology. Any transcriptional errors that result from this process are unintentional.

## 2018-08-29 ENCOUNTER — Other Ambulatory Visit: Payer: Self-pay | Admitting: Family Medicine

## 2018-09-28 ENCOUNTER — Ambulatory Visit: Payer: BC Managed Care – PPO | Admitting: Family Medicine

## 2018-09-28 NOTE — Progress Notes (Deleted)
[name] [address], [place] [address] Phone: [phone] Subjective:    I'm seeing this patient by the request  of:    CC: Shoulder pain follow-up  ASN:[id number]  Claire Irwin is a 48 y.o. female coming in with complaint of left shoulder pain.  Found to have more of a frozen shoulder previously.  Had been making progress.  Onset-  Location Duration-  Character- Aggravating factors- Reliving factors-  Therapies tried-  Severity-     Past Medical History:  Diagnosis Date  . Allergy   . Asthma   . Diabetes mellitus without complication ([place])   . 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 Fibrocystic breast disease   . GERD (gastroesophageal reflux disease)   . History of endometriosis   . Hyperlipidemia 06/29/2011  . Preeclampsia    Past Surgical History:  Procedure Laterality Date  . ABDOMINAL ADHESION SURGERY  07/2004  . CESAREAN SECTION  02/2004  . DILATION AND CURETTAGE, DIAGNOSTIC / THERAPEUTIC     Social History   Socioeconomic History  . Marital status: Legally Separated    Spouse name: Not on file  . Number of children: Not on file  . Years of education: Not on file  . Highest education level: Not on file  Occupational History  . Not on file  Social Needs  . Financial resource strain: Not on file  . Food insecurity:    Worry: Not on file    Inability: Not on file  . Transportation needs:    Medical: Not on file    Non-medical: Not on file  Tobacco Use  . Smoking status: Never Smoker  . Smokeless tobacco: Never Used  Substance and Sexual Activity  . Alcohol use: No  . Drug use: No  . Sexual activity: Not on file  Lifestyle  . Physical activity:    Days per week: Not on file    Minutes per session: Not on file  . Stress: Not on file  Relationships  . Social connections:    Talks on phone: Not on file    Gets together: Not on file    Attends religious service: Not on file   Active member of club or organization: Not on file    Attends meetings of clubs or organizations: Not on file    Relationship status: Not on file  Other Topics Concern  . Not on file  Social History Narrative  . Not on file   Allergies  Allergen Reactions  . Hydrocodone   . Sulfonamide Derivatives    Family History  Problem Relation Age of Onset  . Diabetes Mother   . Hypertension Mother   . Asthma Mother   . Allergies Mother   . Diabetes Father   . Heart disease Paternal Uncle   . Asthma Maternal Grandmother   . Allergies Maternal Grandmother   . Cancer Paternal Grandmother        Brain and Lung    Current Outpatient Medications (Endocrine & Metabolic):  .  empagliflozin (JARDIANCE) 10 MG TABS tablet, Take 10 mg by mouth daily. .  norethindrone (AYGESTIN) 5 MG tablet, Take 5 mg by mouth daily. .  NORETHINDRONE ACETATE PO, Take by mouth.   .  SitaGLIPtin-MetFORMIN HCl (JANUMET XR) 50-1000 MG TB24, Take 1 tablet by mouth daily.  Current Facility-Administered Medications (Endocrine & Metabolic):  .  metFORMIN (GLUCOPHAGE-XR) 24 hr tablet 500 mg    Current Outpatient  Medications (Respiratory):  .  albuterol (PROAIR HFA) 108 (90 Base) MCG/ACT inhaler, USE 2 PUFFS 4 TIMES DAILY AS NEEDED .  Fluticasone-Salmeterol (ADVAIR DISKUS) 250-50 MCG/DOSE AEPB, USE 1 PUFF TWO TIMES DAILY AS NEEDED   Current Outpatient Medications (Analgesics):  .  aspirin EC 81 MG tablet, Take 1 tablet (81 mg total) by mouth daily.     Current Outpatient Medications (Other):  .  clonazePAM (KLONOPIN) 0.5 MG tablet, Take 1 tablet (0.5 mg total) by mouth 2 (two) times daily as needed for anxiety. .  cyclobenzaprine (FLEXERIL) 5 MG tablet, TAKE 1 TABLET (5 MG TOTAL) BY MOUTH 3 (THREE) TIMES DAILY AS NEEDED FOR MUSCLE SPASMS. .  Diclofenac Sodium (PENNSAID) 2 % SOLN, Place 2 g onto the skin 2 (two) times daily. Marland Kitchen  glucose blood (ONE TOUCH ULTRA TEST) test strip, Use to monitor glucose levels once per  day .  Multiple Vitamins-Calcium (ONE-A-DAY WOMENS PO), Take by mouth. .  NONFORMULARY OR COMPOUNDED ITEM, [facility]:  Wart Cream - Cimetidine 2%, Deoxy-D-Glucose 0.2%, Fluorouracil-5 5%, Salicylic Acid 40%, apply to affected areas daily. Glory Rosebush DELICA LANCETS 97D MISC, Use to monitor glucose levels qd .  pantoprazole (PROTONIX) 40 MG tablet, Take 1 tablet (40 mg total) by mouth 2 (two) times daily before a meal. .  prednisoLONE acetate (PRED FORTE) 1 % ophthalmic suspension, 1 drop 4 (four) times daily. .  ranitidine (ZANTAC) 150 MG tablet, Take 1 tablet (150 mg total) by mouth at bedtime. .  triamcinolone cream (KENALOG) 0.1 %, Apply 1 application topically 3 (three) times daily as needed. For rash .  Vitamin D, Ergocalciferol, (DRISDOL) 1.25 MG (50000 UT) CAPS capsule, TAKE 1 CAPSULE (50,000 UNITS TOTAL) BY MOUTH EVERY 7 (SEVEN) DAYS.     Past medical history, social, surgical and family history all reviewed in electronic medical record.  No pertanent information unless stated regarding to the chief complaint.   Review of Systems:  No headache, visual changes, nausea, vomiting, diarrhea, constipation, dizziness, abdominal pain, skin rash, fevers, chills, night sweats, weight loss, swollen lymph nodes, body aches, joint swelling, muscle aches, chest pain, shortness of breath, mood changes.   Objective  There were no vitals taken for this visit. Systems examined below as of    General: No apparent distress alert and oriented x3 mood and affect normal, dressed appropriately.  HEENT: Pupils equal, extraocular movements intact  Respiratory: Patient's speak in full sentences and does not appear short of breath  Cardiovascular: No lower extremity edema, non tender, no erythema  Skin: Warm dry intact with no signs of infection or rash on extremities or on axial skeleton.  Abdomen: Soft nontender  Neuro: Cranial nerves II through XII are intact, neurovascularly intact in all  extremities with 2+ DTRs and 2+ pulses.  Lymph: No lymphadenopathy of posterior or anterior cervical chain or axillae bilaterally.  Gait normal with good balance and coordination.  MSK:  Non tender with full range of motion and good stability and symmetric strength and tone of  elbows, wrist, hip, knee and ankles bilaterally.  Shoulder: Inspection reveals no abnormalities, atrophy or asymmetry. Palpation is normal with no tenderness over AC joint or bicipital groove. ROM is full in all planes. Rotator cuff strength normal throughout. No signs of impingement with negative Neer and Hawkin's tests, empty can sign. Speeds and Yergason's tests normal. No labral pathology noted with negative Obrien's, negative clunk and good stability. Normal scapular function observed. No painful arc and no drop arm sign.  No apprehension sign     Impression and Recommendations:     This case required medical decision making of moderate complexity. The above documentation has been reviewed and is accurate and complete Lyndal Pulley, DO       Note: This dictation was prepared with Dragon dictation along with smaller phrase technology. Any transcriptional errors that result from this process are unintentional.

## 2018-10-11 ENCOUNTER — Ambulatory Visit (INDEPENDENT_AMBULATORY_CARE_PROVIDER_SITE_OTHER): Payer: BC Managed Care – PPO | Admitting: Family Medicine

## 2018-10-11 ENCOUNTER — Encounter: Payer: Self-pay | Admitting: Family Medicine

## 2018-10-11 ENCOUNTER — Ambulatory Visit: Payer: Self-pay

## 2018-10-11 VITALS — BP 102/72 | HR 104 | Ht 65.0 in | Wt 195.0 lb

## 2018-10-11 DIAGNOSIS — G8929 Other chronic pain: Secondary | ICD-10-CM

## 2018-10-11 DIAGNOSIS — M999 Biomechanical lesion, unspecified: Secondary | ICD-10-CM | POA: Diagnosis not present

## 2018-10-11 DIAGNOSIS — M25512 Pain in left shoulder: Secondary | ICD-10-CM | POA: Diagnosis not present

## 2018-10-11 DIAGNOSIS — M7502 Adhesive capsulitis of left shoulder: Secondary | ICD-10-CM

## 2018-10-11 DIAGNOSIS — E11618 Type 2 diabetes mellitus with other diabetic arthropathy: Secondary | ICD-10-CM | POA: Diagnosis not present

## 2018-10-11 NOTE — Progress Notes (Signed)
[name] [address] [place], [place] [address] Phone: [phone] Subjective:   Claire Irwin, am serving as a scribe for Dr. Hulan Saas.  I'm seeing this patient by the request  of:    CC: Shoulder pain follow-up  TTS:[id number]   08/18/2018: At the moment stable.  Patient has been fairly noncompliant on the exercise.  Patient declined formal physical therapy.  Discussed icing regimen and home exercise.  Follow-up again in 4 to 6 weeks.  Update 10/11/2018: Claire Irwin is a 48 y.o. female coming in with complaint of left shoulder pain. Patient states she is doing somewhat better. She said that she feels like she has to move arm throughout the day otherwise her pain increases. Pain over top of shoulder. Patient notes carrying heavy purse on that side.      Past Medical History:  Diagnosis Date  . Allergy   . Asthma   . Diabetes mellitus without complication ([place])   . 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 Fibrocystic breast disease   . GERD (gastroesophageal reflux disease)   . History of endometriosis   . Hyperlipidemia 06/29/2011  . Preeclampsia    Past Surgical History:  Procedure Laterality Date  . ABDOMINAL ADHESION SURGERY  07/2004  . CESAREAN SECTION  02/2004  . DILATION AND CURETTAGE, DIAGNOSTIC / THERAPEUTIC     Social History   Socioeconomic History  . Marital status: Legally Separated    Spouse name: Not on file  . Number of children: Not on file  . Years of education: Not on file  . Highest education level: Not on file  Occupational History  . Not on file  Social Needs  . Financial resource strain: Not on file  . Food insecurity:    Worry: Not on file    Inability: Not on file  . Transportation needs:    Medical: Not on file    Non-medical: Not on file  Tobacco Use  . Smoking status: Never Smoker  . Smokeless tobacco: Never Used  Substance and Sexual Activity  . Alcohol use: Irwin  . Drug  use: Irwin  . Sexual activity: Not on file  Lifestyle  . Physical activity:    Days per week: Not on file    Minutes per session: Not on file  . Stress: Not on file  Relationships  . Social connections:    Talks on phone: Not on file    Gets together: Not on file    Attends religious service: Not on file    Active member of club or organization: Not on file    Attends meetings of clubs or organizations: Not on file    Relationship status: Not on file  Other Topics Concern  . Not on file  Social History Narrative  . Not on file   Allergies  Allergen Reactions  . Hydrocodone   . Sulfonamide Derivatives    Family History  Problem Relation Age of Onset  . Diabetes Mother   . Hypertension Mother   . Asthma Mother   . Allergies Mother   . Diabetes Father   . Heart disease Paternal Uncle   . Asthma Maternal Grandmother   . Allergies Maternal Grandmother   . Cancer Paternal Grandmother        Brain and Lung    Current Outpatient Medications (Endocrine & Metabolic):  .  empagliflozin (JARDIANCE) 10 MG TABS tablet, Take 10 mg by mouth  daily. .  norethindrone (AYGESTIN) 5 MG tablet, Take 5 mg by mouth daily. .  NORETHINDRONE ACETATE PO, Take by mouth.   .  SitaGLIPtin-MetFORMIN HCl (JANUMET XR) 50-1000 MG TB24, Take 1 tablet by mouth daily.  Current Facility-Administered Medications (Endocrine & Metabolic):  .  metFORMIN (GLUCOPHAGE-XR) 24 hr tablet 500 mg    Current Outpatient Medications (Respiratory):  .  albuterol (PROAIR HFA) 108 (90 Base) MCG/ACT inhaler, USE 2 PUFFS 4 TIMES DAILY AS NEEDED .  Fluticasone-Salmeterol (ADVAIR DISKUS) 250-50 MCG/DOSE AEPB, USE 1 PUFF TWO TIMES DAILY AS NEEDED   Current Outpatient Medications (Analgesics):  .  aspirin EC 81 MG tablet, Take 1 tablet (81 mg total) by mouth daily.     Current Outpatient Medications (Other):  .  clonazePAM (KLONOPIN) 0.5 MG tablet, Take 1 tablet (0.5 mg total) by mouth 2 (two) times daily as needed for  anxiety. .  cyclobenzaprine (FLEXERIL) 5 MG tablet, TAKE 1 TABLET (5 MG TOTAL) BY MOUTH 3 (THREE) TIMES DAILY AS NEEDED FOR MUSCLE SPASMS. .  Diclofenac Sodium (PENNSAID) 2 % SOLN, Place 2 g onto the skin 2 (two) times daily. Marland Kitchen  glucose blood (ONE TOUCH ULTRA TEST) test strip, Use to monitor glucose levels once per day .  Multiple Vitamins-Calcium (ONE-A-DAY WOMENS PO), Take by mouth. .  NONFORMULARY OR COMPOUNDED ITEM, [facility]:  Wart Cream - Cimetidine 2%, Deoxy-D-Glucose 0.2%, Fluorouracil-5 5%, Salicylic Acid 80%, apply to affected areas daily. Glory Rosebush DELICA LANCETS 99I MISC, Use to monitor glucose levels qd .  pantoprazole (PROTONIX) 40 MG tablet, Take 1 tablet (40 mg total) by mouth 2 (two) times daily before a meal. .  prednisoLONE acetate (PRED FORTE) 1 % ophthalmic suspension, 1 drop 4 (four) times daily. .  ranitidine (ZANTAC) 150 MG tablet, Take 1 tablet (150 mg total) by mouth at bedtime. .  triamcinolone cream (KENALOG) 0.1 %, Apply 1 application topically 3 (three) times daily as needed. For rash .  Vitamin D, Ergocalciferol, (DRISDOL) 1.25 MG (50000 UT) CAPS capsule, TAKE 1 CAPSULE (50,000 UNITS TOTAL) BY MOUTH EVERY 7 (SEVEN) DAYS.     Past medical history, social, surgical and family history all reviewed in electronic medical record.  Irwin pertanent information unless stated regarding to the chief complaint.   Review of Systems:  Irwin headache, visual changes, nausea, vomiting, diarrhea, constipation, dizziness, abdominal pain, skin rash, fevers, chills, night sweats, weight loss, swollen lymph nodes, body aches, joint swelling, muscle aches, chest pain, shortness of breath, mood changes.   Objective  There were Irwin vitals taken for this visit. Systems examined below as of    General: Irwin apparent distress alert and oriented x3 mood and affect normal, dressed appropriately.  HEENT: Pupils equal, extraocular movements intact  Respiratory: Patient's speak in full  sentences and does not appear short of breath  Cardiovascular: Irwin lower extremity edema, non tender, Irwin erythema  Skin: Warm dry intact with Irwin signs of infection or rash on extremities or on axial skeleton.  Abdomen: Soft nontender  Neuro: Cranial nerves II through XII are intact, neurovascularly intact in all extremities with 2+ DTRs and 2+ pulses.  Lymph: Irwin lymphadenopathy of posterior or anterior cervical chain or axillae bilaterally.  Gait normal with good balance and coordination.  MSK:  Non tender with full range of motion and good stability and symmetric strength and tone of  elbows, wrist, hip, knee and ankles bilaterally.  Shoulder: Left Inspection reveals Irwin abnormalities, atrophy or asymmetry. Palpation is normal  with Irwin tenderness over AC joint or bicipital groove. ROM is full in all planes. Rotator cuff strength normal throughout. Very mild impingement noted Speeds and Yergason's tests normal. Irwin labral pathology noted with negative Obrien's, negative clunk and good stability. Normal scapular function observed. Irwin painful arc and Irwin drop arm sign. Irwin apprehension sign Contralateral shoulder unremarkable  Neck exam shows some mild loss of lordosis.  Patient has near full range of motion but does have some mild crepitus.  Negative Spurling's.  Osteopathic findings C2 flexed rotated and side bent right C6 flexed rotated and side bent left T3 extended rotated and side bent right inhaled third rib        Impression and Recommendations:     This case required medical decision making of moderate complexity. The above documentation has been reviewed and is accurate and complete Lyndal Pulley, DO       Note: This dictation was prepared with Dragon dictation along with smaller phrase technology. Any transcriptional errors that result from this process are unintentional.

## 2018-10-11 NOTE — Patient Instructions (Signed)
Good to see you  Ice is your friend See me again in 6-8 weeks  

## 2018-10-11 NOTE — Assessment & Plan Note (Signed)
Seems to be doing very well.  Discussed icing regimen and home exercise.  As long as patient continues to have full range of motion I think we do not need to do any more aggressive therapies.  Follow-up again in 6 to 8 weeks

## 2018-10-11 NOTE — Assessment & Plan Note (Signed)
Decision today to treat with OMT was based on Physical Exam  After verbal consent patient was treated with HVLA, ME, FPR techniques in cervical, thoracic, rib areas  Patient tolerated the procedure well with improvement in symptoms  Patient given exercises, stretches and lifestyle modifications  See medications in patient instructions if given  Patient will follow up in 4-8 weeks 

## 2018-10-31 ENCOUNTER — Ambulatory Visit: Payer: BC Managed Care – PPO | Admitting: Internal Medicine

## 2018-10-31 ENCOUNTER — Encounter: Payer: Self-pay | Admitting: Internal Medicine

## 2018-10-31 VITALS — BP 100/68 | HR 120 | Temp 100.9°F | Ht 65.0 in | Wt 191.0 lb

## 2018-10-31 DIAGNOSIS — R6889 Other general symptoms and signs: Secondary | ICD-10-CM | POA: Diagnosis not present

## 2018-10-31 DIAGNOSIS — E119 Type 2 diabetes mellitus without complications: Secondary | ICD-10-CM

## 2018-10-31 DIAGNOSIS — J4520 Mild intermittent asthma, uncomplicated: Secondary | ICD-10-CM

## 2018-10-31 MED ORDER — OSELTAMIVIR PHOSPHATE 75 MG PO CAPS: 75.0000 mg | ORAL_CAPSULE | Freq: Two times a day (BID) | ORAL | 0 refills | Status: AC

## 2018-10-31 NOTE — Assessment & Plan Note (Signed)
Complicated today by flu-like illness. No wheezing on exam so steroids not given with concurrent diabetes. No antibiotics indicated. Continue advair and flonase and add zyrtec. Call back for worsening SOB.

## 2018-10-31 NOTE — Patient Instructions (Addendum)
We have sent in tamiflu to start taking today 1 pill twice a day for 5 days.  Drink plenty of fluids and you can use over the counter cold medicine if wanted.   Keep taking the advair and the flonase. Use the albuterol if needed. I would recommend to start zyrtec (cetirizine) also to help with drainage.   Usually by day 4-5 most people are feeling the worst and the average flu lasts 7-10 days.  Call back for worsening breathing problems or if not getting better as expected.    Influenza, Adult Influenza is also called "the flu." It is an infection in the lungs, nose, and throat (respiratory tract). It is caused by a virus. The flu causes symptoms that are similar to symptoms of a cold. It also causes a high fever and body aches. The flu spreads easily from person to person (is contagious). Getting a flu shot (influenza vaccination) every year is the best way to prevent the flu. What are the causes? This condition is caused by the influenza virus. You can get the virus by:  Breathing in droplets that are in the air from the cough or sneeze of a person who has the virus.  Touching something that has the virus on it (is contaminated) and then touching your mouth, nose, or eyes. What increases the risk? Certain things may make you more likely to get the flu. These include:  Not washing your hands often.  Having close contact with many people during cold and flu season.  Touching your mouth, eyes, or nose without first washing your hands.  Not getting a flu shot every year. You may have a higher risk for the flu, along with serious problems such as a lung infection (pneumonia), if you:  Are older than 65.  Are pregnant.  Have a weakened disease-fighting system (immune system) because of a disease or taking certain medicines.  Have a long-term (chronic) illness, such as: ? Heart, kidney, or lung disease. ? Diabetes. ? Asthma.  Have a liver disorder.  Are very overweight  (morbidly obese).  Have anemia. This is a condition that affects your red blood cells. What are the signs or symptoms? Symptoms usually begin suddenly and last 4-14 days. They may include:  Fever and chills.  Headaches, body aches, or muscle aches.  Sore throat.  Cough.  Runny or stuffy (congested) nose.  Chest discomfort.  Not wanting to eat as much as normal (poor appetite).  Weakness or feeling tired (fatigue).  Dizziness.  Feeling sick to your stomach (nauseous) or throwing up (vomiting). How is this treated? If the flu is found early, you can be treated with medicine that can help reduce how bad the illness is and how long it lasts (antiviral medicine). This may be given by mouth (orally) or through an IV tube. Taking care of yourself at home can help your symptoms get better. Your doctor may suggest:  Taking over-the-counter medicines.  Drinking plenty of fluids. The flu often goes away on its own. If you have very bad symptoms or other problems, you may be treated in a hospital. Follow these instructions at home:     Activity  Rest as needed. Get plenty of sleep.  Stay home from work or school as told by your doctor. ? Do not leave home until you do not have a fever for 24 hours without taking medicine. ? Leave home only to visit your doctor. Eating and drinking  Take an ORS (oral rehydration solution).  This is a drink that is sold at pharmacies and stores.  Drink enough fluid to keep your pee (urine) pale yellow.  Drink clear fluids in small amounts as you are able. Clear fluids include: ? Water. ? Ice chips. ? Fruit juice that has water added (diluted fruit juice). ? Low-calorie sports drinks.  Eat bland, easy-to-digest foods in small amounts as you are able. These foods include: ? Bananas. ? Applesauce. ? Rice. ? Lean meats. ? Toast. ? Crackers.  Do not eat or drink: ? Fluids that have a lot of sugar or caffeine. ? Alcohol. ? Spicy or fatty  foods. General instructions  Take over-the-counter and prescription medicines only as told by your doctor.  Use a cool mist humidifier to add moisture to the air in your home. This can make it easier for you to breathe.  Cover your mouth and nose when you cough or sneeze.  Wash your hands with soap and water often, especially after you cough or sneeze. If you cannot use soap and water, use alcohol-based hand sanitizer.  Keep all follow-up visits as told by your doctor. This is important. How is this prevented?   Get a flu shot every year. You may get the flu shot in late summer, fall, or winter. Ask your doctor when you should get your flu shot.  Avoid contact with people who are sick during fall and winter (cold and flu season). Contact a doctor if:  You get new symptoms.  You have: ? Chest pain. ? Watery poop (diarrhea). ? A fever.  Your cough gets worse.  You start to have more mucus.  You feel sick to your stomach.  You throw up. Get help right away if you:  Have shortness of breath.  Have trouble breathing.  Have skin or nails that turn a bluish color.  Have very bad pain or stiffness in your neck.  Get a sudden headache.  Get sudden pain in your face or ear.  Cannot eat or drink without throwing up. Summary  Influenza ("the flu") is an infection in the lungs, nose, and throat. It is caused by a virus.  Take over-the-counter and prescription medicines only as told by your doctor.  Getting a flu shot every year is the best way to avoid getting the flu. This information is not intended to replace advice given to you by your health care provider. Make sure you discuss any questions you have with your health care provider. Document Released: 05/26/2008 Document Revised: 02/02/2018 Document Reviewed: 02/02/2018 Elsevier Interactive Patient Education  2019 [employer].

## 2018-10-31 NOTE — Assessment & Plan Note (Signed)
Inside window for tamiflu which is prescribed. Advised to continue flonase, advair, albuterol prn. Add zyrtec. Call back with worsening or more SOB.

## 2018-10-31 NOTE — Assessment & Plan Note (Signed)
Will avoid steroids today given concurrent diabetes. Will continue same management and talked about lability of sugars with infection and to monitor more closely.

## 2018-10-31 NOTE — Progress Notes (Signed)
   Subjective:   Patient ID: Claire Irwin, female    DOB: 03-28-1971, 48 y.o.   MRN: [id number]  HPI The patient is a 47 y.o. female coming in for cold symptoms. Started yesterday. Main symptoms are: cough with green sputum, burning with coughing, headache and fevers. Denies SOB. Is a teacher so sick contacts. Overall it is worsening. Does have concurrent asthma. Still taking advair and has albuterol inhaler. Has tried emergen-C and alka seltzer cold and flu. Also has concurrent diabetes. Denies low or high sugars recently but does not monitor.   Review of Systems  Constitutional: Positive for activity change, appetite change and chills. Negative for fatigue, fever and unexpected weight change.  HENT: Positive for congestion, postnasal drip, rhinorrhea and sinus pressure. Negative for ear discharge, ear pain, sinus pain, sneezing, sore throat, tinnitus, trouble swallowing and voice change.   Eyes: Negative.   Respiratory: Positive for cough. Negative for chest tightness, shortness of breath and wheezing.   Cardiovascular: Negative.  Negative for chest pain, palpitations and leg swelling.  Gastrointestinal: Negative.  Negative for abdominal distention, abdominal pain, constipation, diarrhea, nausea and vomiting.  Musculoskeletal: Positive for myalgias.  Skin: Negative.   Neurological: Negative.   Psychiatric/Behavioral: Negative.     Objective:  Physical Exam Constitutional:      Appearance: She is well-developed.  HENT:     Head: Normocephalic and atraumatic.     Comments: Oropharynx with redness and clear drainage, nose with swollen turbinates, TMs normal bilaterally.  Neck:     Musculoskeletal: Normal range of motion.     Thyroid: No thyromegaly.  Cardiovascular:     Rate and Rhythm: Regular rhythm. Tachycardia present.  Pulmonary:     Effort: Pulmonary effort is normal. No respiratory distress.     Breath sounds: Normal breath sounds. No wheezing or rales.  Abdominal:   General: Bowel sounds are normal. There is no distension.     Palpations: Abdomen is soft.     Tenderness: There is no abdominal tenderness. There is no rebound.  Musculoskeletal:        General: Tenderness present.  Lymphadenopathy:     Cervical: No cervical adenopathy.  Skin:    General: Skin is warm and dry.  Neurological:     Mental Status: She is alert and oriented to person, place, and time.     Coordination: Coordination normal.     Vitals:   10/31/18 0951  BP: 100/68  Pulse: (!) 120  Temp: (!) 100.9 F (38.3 C)  TempSrc: Oral  SpO2: 98%  Weight: 191 lb (86.6 kg)  Height: 5\' 5"  (1.651 m)    Assessment & Plan:

## 2018-11-28 ENCOUNTER — Ambulatory Visit: Payer: BC Managed Care – PPO | Admitting: Family Medicine

## 2019-01-09 ENCOUNTER — Telehealth: Payer: Self-pay | Admitting: Internal Medicine

## 2019-01-09 ENCOUNTER — Telehealth: Payer: Self-pay | Admitting: Endocrinology

## 2019-01-09 NOTE — Telephone Encounter (Signed)
Patient has called stating she has been having trouble going to the bathroom. Sates Dr.Ellison usually asks this during the visit and she is now having issues.  Will also call PCP to inform.

## 2019-01-09 NOTE — Telephone Encounter (Signed)
Please advise if you want to see her or if you would prefer PCP to manage

## 2019-01-09 NOTE — Telephone Encounter (Signed)
Copied from [name] [id number]. Topic: Quick Communication - See Telephone Encounter >> Jan 09, 2019 11:44 AM Robina Ade, Helene Kelp D wrote: CRM for notification. See Telephone encounter for: 01/09/19. Patient called and said that she spoke with her gynecologist and he advised her to talk to her pcp about constipation pain. Please call patient back, thanks.

## 2019-01-09 NOTE — Telephone Encounter (Signed)
Called pt. Declined sooner appt with Dr. Loanne Drilling. States she is scheduled to see PCP tomorrow. Would prefer to keep appt with Dr. Loanne Drilling as scheduled for June.

## 2019-01-09 NOTE — Telephone Encounter (Signed)
Yes, please advise f/u

## 2019-01-10 ENCOUNTER — Encounter: Payer: Self-pay | Admitting: Internal Medicine

## 2019-01-10 ENCOUNTER — Ambulatory Visit (INDEPENDENT_AMBULATORY_CARE_PROVIDER_SITE_OTHER): Payer: BC Managed Care – PPO | Admitting: Internal Medicine

## 2019-01-10 DIAGNOSIS — E785 Hyperlipidemia, unspecified: Secondary | ICD-10-CM

## 2019-01-10 DIAGNOSIS — J4520 Mild intermittent asthma, uncomplicated: Secondary | ICD-10-CM | POA: Diagnosis not present

## 2019-01-10 DIAGNOSIS — F411 Generalized anxiety disorder: Secondary | ICD-10-CM

## 2019-01-10 DIAGNOSIS — E119 Type 2 diabetes mellitus without complications: Secondary | ICD-10-CM | POA: Diagnosis not present

## 2019-01-10 DIAGNOSIS — K5909 Other constipation: Secondary | ICD-10-CM

## 2019-01-10 DIAGNOSIS — Z0000 Encounter for general adult medical examination without abnormal findings: Secondary | ICD-10-CM

## 2019-01-10 MED ORDER — POLYETHYLENE GLYCOL 3350 17 GM/SCOOP PO POWD: 17.0000 g | Freq: Two times a day (BID) | ORAL | 1 refills | Status: DC | PRN

## 2019-01-10 MED ORDER — ALBUTEROL SULFATE HFA 108 (90 BASE) MCG/ACT IN AERS: INHALATION_SPRAY | RESPIRATORY_TRACT | 2 refills | Status: DC

## 2019-01-10 MED ORDER — FAMOTIDINE 20 MG PO TABS: 20.0000 mg | ORAL_TABLET | Freq: Two times a day (BID) | ORAL | 3 refills | Status: DC

## 2019-01-10 MED ORDER — LACTULOSE 10 GM/15ML PO SOLN: 30.0000 g | Freq: Every day | ORAL | 11 refills | Status: DC | PRN

## 2019-01-10 NOTE — Assessment & Plan Note (Signed)
stable overall by history and exam, recent data reviewed with pt, and pt to continue medical treatment as before,  to f/u any worsening symptoms or concerns  

## 2019-01-10 NOTE — Telephone Encounter (Signed)
Noted  

## 2019-01-10 NOTE — Assessment & Plan Note (Signed)
With mild increased symtpoms, ok for miralax asd, colace bid, lactulose trial, and consider linzess if not improved

## 2019-01-10 NOTE — Assessment & Plan Note (Signed)
stable overall by history and exam, recent data reviewed with pt, and pt to continue medical treatment as before,  to f/u any worsening symptoms or concerns, for a1c with next labs

## 2019-01-10 NOTE — Progress Notes (Signed)
Patient ID: Claire Irwin, female   DOB: 06-12-1971, 48 y.o.   MRN: [id number]  Virtual Visit via Video Note  I connected with Claire Irwin on 01/10/19 at  1:20 PM EDT by a video enabled telemedicine application and verified that I am speaking with the correct person using two identifiers.  Location: Patient: at home no others present Provider: at office   I discussed the limitations of evaluation and management by telemedicine and the availability of in person appointments. The patient expressed understanding and agreed to proceed.  History of Present Illness: Here with c/o persistent constipation, worse recently now mod to severe for the past few days when usually mild intermittent,  just cant seem to void completely, assoc with mild lower abd discomfort crampy at times, without radiation, n/v, wt loss , loss appetite, fever and  follow up any worsening symptoms.  Not better with MOM and citrucel.  Has had increased stress, and less active as well.   Denies urinary symptoms such as dysuria, frequency, urgency, flank pain, hematuria or n/v, fever, chills.  Pt denies chest pain, increased sob or doe, wheezing, orthopnea, PND, increased LE swelling, palpitations, dizziness or syncope.   Pt denies polydipsia, polyuria Past Medical History:  Diagnosis Date  . Allergy   . Asthma   . Diabetes mellitus without complication ([place])   . 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 Fibrocystic breast disease   . GERD (gastroesophageal reflux disease)   . History of endometriosis   . Hyperlipidemia 06/29/2011  . Preeclampsia    Past Surgical History:  Procedure Laterality Date  . ABDOMINAL ADHESION SURGERY  07/2004  . CESAREAN SECTION  02/2004  . DILATION AND CURETTAGE, DIAGNOSTIC / THERAPEUTIC      reports that she has never smoked. She has never used smokeless tobacco. She reports that she does not drink alcohol or use drugs. family history includes Allergies in her maternal  grandmother and mother; Asthma in her maternal grandmother and mother; Cancer in her paternal grandmother; Diabetes in her father and mother; Heart disease in her paternal uncle; Hypertension in her mother. Allergies  Allergen Reactions  . Hydrocodone   . Sulfonamide Derivatives    Current Outpatient Medications on File Prior to Visit  Medication Sig Dispense Refill  . aspirin EC 81 MG tablet Take 1 tablet (81 mg total) by mouth daily. 90 tablet 11  . clonazePAM (KLONOPIN) 0.5 MG tablet Take 1 tablet (0.5 mg total) by mouth 2 (two) times daily as needed for anxiety. 60 tablet 1  . cyclobenzaprine (FLEXERIL) 5 MG tablet TAKE 1 TABLET (5 MG TOTAL) BY MOUTH 3 (THREE) TIMES DAILY AS NEEDED FOR MUSCLE SPASMS. 60 tablet 0  . Diclofenac Sodium (PENNSAID) 2 % SOLN Place 2 g onto the skin 2 (two) times daily. 112 g 3  . empagliflozin (JARDIANCE) 10 MG TABS tablet Take 10 mg by mouth daily. 30 tablet 11  . Fluticasone-Salmeterol (ADVAIR DISKUS) 250-50 MCG/DOSE AEPB USE 1 PUFF TWO TIMES DAILY AS NEEDED 180 each 3  . glucose blood (ONE TOUCH ULTRA TEST) test strip Use to monitor glucose levels once per day 50 each 12  . Multiple Vitamins-Calcium (ONE-A-DAY WOMENS PO) Take by mouth.    . NONFORMULARY OR COMPOUNDED ITEM [facility]:  Wart Cream - Cimetidine 2%, Deoxy-D-Glucose 0.2%, Fluorouracil-5 5%, Salicylic Acid 35%, apply to affected areas daily. 120 each 2  . norethindrone (AYGESTIN) 5 MG tablet Take 5 mg by mouth daily.  10  . NORETHINDRONE ACETATE PO Take by mouth.      Glory Rosebush DELICA LANCETS 25Q MISC Use to monitor glucose levels qd 100 each 1  . pantoprazole (PROTONIX) 40 MG tablet Take 1 tablet (40 mg total) by mouth 2 (two) times daily before a meal. 180 tablet 3  . prednisoLONE acetate (PRED FORTE) 1 % ophthalmic suspension 1 drop 4 (four) times daily.    . SitaGLIPtin-MetFORMIN HCl (JANUMET XR) 50-1000 MG TB24 Take 1 tablet by mouth daily. 90 tablet 3  . triamcinolone cream (KENALOG)  0.1 % Apply 1 application topically 3 (three) times daily as needed. For rash 45 g 0  . Vitamin D, Ergocalciferol, (DRISDOL) 1.25 MG (50000 UT) CAPS capsule TAKE 1 CAPSULE (50,000 UNITS TOTAL) BY MOUTH EVERY 7 (SEVEN) DAYS. 12 capsule 0   No current facility-administered medications on file prior to visit.     Observations/Objective: Alert, NAD, appropriate mood and affect, resps normal, cn 2-12 intact, moves all 4s, no visible rash or swelling Lab Results  Component Value Date   WBC 6.2 03/11/2018   HGB 16.0 (H) 03/11/2018   HCT 46.6 (H) 03/11/2018   PLT 269.0 03/11/2018   GLUCOSE 128 (H) 03/11/2018   CHOL 160 03/11/2018   TRIG 88.0 03/11/2018   HDL 47.80 03/11/2018   LDLCALC 95 03/11/2018   ALT 24 03/11/2018   AST 19 03/11/2018   NA 141 03/11/2018   K 3.9 03/11/2018   CL 105 03/11/2018   CREATININE 1.24 (H) 03/11/2018   BUN 11 03/11/2018   CO2 25 03/11/2018   TSH 1.46 03/11/2018   HGBA1C 5.8 (A) 08/04/2018   MICROALBUR <0.7 03/11/2018   Assessment and Plan: See notes  Follow Up Instructions: Seen notes   I discussed the assessment and treatment plan with the patient. The patient was provided an opportunity to ask questions and all were answered. The patient agreed with the plan and demonstrated an understanding of the instructions.   The patient was advised to call back or seek an in-person evaluation if the symptoms worsen or if the condition fails to improve as anticipated.  Cathlean Cower, MD

## 2019-01-10 NOTE — Assessment & Plan Note (Signed)
Declines change in tx or need for counseling

## 2019-01-10 NOTE — Patient Instructions (Signed)
Ok to take the lactulose (or maybe retry the miralax) as needed  Please call if you would want to try the Linzess  Please take all new medication as prescribed - the Pepcid (instead of the zantac)  Please continue all other medications as before, and refills have been done if requested.  Please have the pharmacy call with any other refills you may need.  Please keep your appointments with your specialists as you may have planned  Please return in 6 months, or sooner if needed, with Lab testing done 3-5 days before

## 2019-01-16 ENCOUNTER — Telehealth: Payer: Self-pay | Admitting: Internal Medicine

## 2019-01-16 NOTE — Telephone Encounter (Signed)
Copied from [place] [id number]. Topic: General - Other >> Jan 16, 2019  8:43 AM Carolyn Stare wrote:  Pt call to req an appt said she found a growth in her nose

## 2019-01-16 NOTE — Telephone Encounter (Signed)
Patient is requesting an in office appointment for a growth that she found in her nose.  Okay to schedule?

## 2019-01-16 NOTE — Telephone Encounter (Signed)
Appointment scheduled.

## 2019-01-16 NOTE — Telephone Encounter (Signed)
Ok for OV now as this is unlikely to be able to be evaluated well by e visit

## 2019-01-18 ENCOUNTER — Other Ambulatory Visit: Payer: Self-pay

## 2019-01-18 ENCOUNTER — Ambulatory Visit (INDEPENDENT_AMBULATORY_CARE_PROVIDER_SITE_OTHER): Payer: BC Managed Care – PPO | Admitting: Internal Medicine

## 2019-01-18 ENCOUNTER — Encounter: Payer: Self-pay | Admitting: Internal Medicine

## 2019-01-18 VITALS — BP 124/80 | HR 98 | Temp 98.2°F | Ht 65.0 in | Wt 193.0 lb

## 2019-01-18 DIAGNOSIS — E119 Type 2 diabetes mellitus without complications: Secondary | ICD-10-CM | POA: Diagnosis not present

## 2019-01-18 DIAGNOSIS — J4520 Mild intermittent asthma, uncomplicated: Secondary | ICD-10-CM

## 2019-01-18 DIAGNOSIS — J329 Chronic sinusitis, unspecified: Secondary | ICD-10-CM | POA: Insufficient documentation

## 2019-01-18 DIAGNOSIS — E785 Hyperlipidemia, unspecified: Secondary | ICD-10-CM

## 2019-01-18 DIAGNOSIS — R220 Localized swelling, mass and lump, head: Secondary | ICD-10-CM

## 2019-01-18 MED ORDER — AMOXICILLIN-POT CLAVULANATE 875-125 MG PO TABS: 1.0000 | ORAL_TABLET | Freq: Two times a day (BID) | ORAL | 0 refills | Status: DC

## 2019-01-18 MED ORDER — METHYLPREDNISOLONE ACETATE 80 MG/ML IJ SUSP
80.0000 mg | Freq: Once | INTRAMUSCULAR | Status: AC
  Administered 2019-01-18: 80 mg via INTRAMUSCULAR

## 2019-01-18 MED ORDER — PREDNISONE 10 MG PO TABS: ORAL_TABLET | ORAL | 0 refills | Status: DC

## 2019-01-18 NOTE — Assessment & Plan Note (Signed)
stable overall by history and exam, recent data reviewed with pt, and pt to continue medical treatment as before,  to f/u any worsening symptoms or concerns  

## 2019-01-18 NOTE — Patient Instructions (Signed)
You had the steroid shot today  Please take all new medication as prescribed - the antibiotic, and prednisone  You will be contacted regarding the referral for: CT scan for the sinus - to see [facility] now  Please continue all other medications as before, and refills have been done if requested.  Please have the pharmacy call with any other refills you may need.  Please keep your appointments with your specialists as you may have planned

## 2019-01-18 NOTE — Progress Notes (Signed)
Subjective:    Patient ID: Claire Irwin, female    DOB: [birth year], 48 y.o.   MRN: [id number]  HPI  Here to f/u with 1 wk onset gradually worsening right nasal and maxillary sinus area pain, swelling and slight nasal d/c yellowish and clear d/c, hard to breathe on that side, pain now about 5/10, not assoc with fever she is aware, has no ST or HA, vision change or blood.  Pt denies chest pain, increased sob or doe, wheezing, orthopnea, PND, increased LE swelling, palpitations, dizziness or syncope.   Pt denies polydipsia, polyuria Past Medical History:  Diagnosis Date  . Allergy   . Asthma   . Diabetes mellitus without complication ([place])   . 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 Fibrocystic breast disease   . GERD (gastroesophageal reflux disease)   . History of endometriosis   . Hyperlipidemia 06/29/2011  . Preeclampsia    Past Surgical History:  Procedure Laterality Date  . ABDOMINAL ADHESION SURGERY  07/2004  . CESAREAN SECTION  02/2004  . DILATION AND CURETTAGE, DIAGNOSTIC / THERAPEUTIC      reports that she has never smoked. She has never used smokeless tobacco. She reports that she does not drink alcohol or use drugs. family history includes Allergies in her maternal grandmother and mother; Asthma in her maternal grandmother and mother; Cancer in her paternal grandmother; Diabetes in her father and mother; Heart disease in her paternal uncle; Hypertension in her mother. Allergies  Allergen Reactions  . Hydrocodone   . Sulfonamide Derivatives    Current Outpatient Medications on File Prior to Visit  Medication Sig Dispense Refill  . albuterol (PROAIR HFA) 108 (90 Base) MCG/ACT inhaler USE 2 PUFFS 4 TIMES DAILY AS NEEDED 8.5 each 2  . aspirin EC 81 MG tablet Take 1 tablet (81 mg total) by mouth daily. 90 tablet 11  . clonazePAM (KLONOPIN) 0.5 MG tablet Take 1 tablet (0.5 mg total) by mouth 2 (two) times daily as needed for anxiety. 60 tablet 1  .  cyclobenzaprine (FLEXERIL) 5 MG tablet TAKE 1 TABLET (5 MG TOTAL) BY MOUTH 3 (THREE) TIMES DAILY AS NEEDED FOR MUSCLE SPASMS. 60 tablet 0  . Diclofenac Sodium (PENNSAID) 2 % SOLN Place 2 g onto the skin 2 (two) times daily. 112 g 3  . empagliflozin (JARDIANCE) 10 MG TABS tablet Take 10 mg by mouth daily. 30 tablet 11  . famotidine (PEPCID) 20 MG tablet Take 1 tablet (20 mg total) by mouth 2 (two) times daily. 180 tablet 3  . Fluticasone-Salmeterol (ADVAIR DISKUS) 250-50 MCG/DOSE AEPB USE 1 PUFF TWO TIMES DAILY AS NEEDED 180 each 3  . glucose blood (ONE TOUCH ULTRA TEST) test strip Use to monitor glucose levels once per day 50 each 12  . lactulose (CHRONULAC) 10 GM/15ML solution Take 45 mLs (30 g total) by mouth daily as needed for mild constipation. 236 mL 11  . Multiple Vitamins-Calcium (ONE-A-DAY WOMENS PO) Take by mouth.    . NONFORMULARY OR COMPOUNDED ITEM [facility]:  Wart Cream - Cimetidine 2%, Deoxy-D-Glucose 0.2%, Fluorouracil-5 5%, Salicylic Acid 46%, apply to affected areas daily. 120 each 2  . norethindrone (AYGESTIN) 5 MG tablet Take 5 mg by mouth daily.  10  . NORETHINDRONE ACETATE PO Take by mouth.      Glory Rosebush DELICA LANCETS 56C MISC Use to monitor glucose levels qd 100 each 1  . pantoprazole (PROTONIX) 40 MG tablet Take 1 tablet (40 mg  total) by mouth 2 (two) times daily before a meal. 180 tablet 3  . polyethylene glycol powder (GLYCOLAX/MIRALAX) 17 GM/SCOOP powder Take 17 g by mouth 2 (two) times daily as needed. 3350 g 1  . prednisoLONE acetate (PRED FORTE) 1 % ophthalmic suspension 1 drop 4 (four) times daily.    . SitaGLIPtin-MetFORMIN HCl (JANUMET XR) 50-1000 MG TB24 Take 1 tablet by mouth daily. 90 tablet 3  . triamcinolone cream (KENALOG) 0.1 % Apply 1 application topically 3 (three) times daily as needed. For rash 45 g 0  . Vitamin D, Ergocalciferol, (DRISDOL) 1.25 MG (50000 UT) CAPS capsule TAKE 1 CAPSULE (50,000 UNITS TOTAL) BY MOUTH EVERY 7 (SEVEN) DAYS. 12  capsule 0   No current facility-administered medications on file prior to visit.    Review of Systems  Constitutional: Negative for other unusual diaphoresis or sweats HENT: Negative for ear discharge or swelling Eyes: Negative for other worsening visual disturbances Respiratory: Negative for stridor or other swelling  Gastrointestinal: Negative for worsening distension or other blood Genitourinary: Negative for retention or other urinary change Musculoskeletal: Negative for other MSK pain or swelling Skin: Negative for color change or other new lesions Neurological: Negative for worsening tremors and other numbness  Psychiatric/Behavioral: Negative for worsening agitation or other fatigue All other system neg per pt    Objective:   Physical Exam BP 124/80   Pulse 98   Temp 98.2 F (36.8 C) (Oral)   Ht 5\' 5"  (1.651 m)   Wt 193 lb (87.5 kg)   SpO2 99%   BMI 32.12 kg/m  VS noted,  Constitutional: Pt appears in NAD HENT: Head: NCAT.  Right Ear: External ear normal.  Left Ear: External ear normal.  Eyes: . Pupils are equal, round, and reactive to light. Conjunctivae and EOM are normal Nose and facies with right nasal and maxillary sinus area moderate tender, swelling; also right intranasal marked edema with mild erythema noted Neck: Neck supple. Gross normal ROM Cardiovascular: Normal rate and regular rhythm.   Pulmonary/Chest: Effort normal and breath sounds without rales or wheezing.  Neurological: Pt is alert. At baseline orientation, motor grossly intact Skin: Skin is warm. No rashes, other new lesions, no LE edema Psychiatric: Pt behavior is normal without agitation  No other exam findings  Lab Results  Component Value Date   WBC 6.2 03/11/2018   HGB 16.0 (H) 03/11/2018   HCT 46.6 (H) 03/11/2018   PLT 269.0 03/11/2018   GLUCOSE 128 (H) 03/11/2018   CHOL 160 03/11/2018   TRIG 88.0 03/11/2018   HDL 47.80 03/11/2018   LDLCALC 95 03/11/2018   ALT 24 03/11/2018    AST 19 03/11/2018   NA 141 03/11/2018   K 3.9 03/11/2018   CL 105 03/11/2018   CREATININE 1.24 (H) 03/11/2018   BUN 11 03/11/2018   CO2 25 03/11/2018   TSH 1.46 03/11/2018   HGBA1C 5.8 (A) 08/04/2018   MICROALBUR <0.7 03/11/2018       Assessment & Plan:

## 2019-01-18 NOTE — Assessment & Plan Note (Signed)
Mild to mod, for antibx course, but as cannot r/o mass or cyst or other, will need CT sinus, to f/u any worsening symptoms or concerns

## 2019-01-19 ENCOUNTER — Ambulatory Visit
Admission: RE | Admit: 2019-01-19 | Discharge: 2019-01-19 | Disposition: A | Discharge status: Home / Self Care | Payer: BC Managed Care – PPO | Source: Ambulatory Visit | Attending: Internal Medicine | Admitting: Internal Medicine

## 2019-01-19 DIAGNOSIS — R220 Localized swelling, mass and lump, head: Secondary | ICD-10-CM

## 2019-01-19 MED ORDER — IOPAMIDOL (ISOVUE-300) INJECTION 61%
75.0000 mL | Freq: Once | INTRAVENOUS | Status: AC | PRN
  Administered 2019-01-19: 75 mL via INTRAVENOUS

## 2019-02-02 ENCOUNTER — Other Ambulatory Visit: Payer: Self-pay

## 2019-02-02 ENCOUNTER — Ambulatory Visit (INDEPENDENT_AMBULATORY_CARE_PROVIDER_SITE_OTHER): Payer: BC Managed Care – PPO | Admitting: Endocrinology

## 2019-02-02 DIAGNOSIS — E1129 Type 2 diabetes mellitus with other diabetic kidney complication: Secondary | ICD-10-CM

## 2019-02-02 DIAGNOSIS — E119 Type 2 diabetes mellitus without complications: Secondary | ICD-10-CM

## 2019-02-02 NOTE — Progress Notes (Signed)
Subjective:    Patient ID: Claire Irwin, female    DOB: 1971/04/11, 48 y.o.   MRN: [id number]  HPI telehealth visit today via doxy video visit.  Alternatives to telehealth are presented to this patient, and the patient agrees to the telehealth visit.  Pt is advised of the cost of the visit, and agrees to this, also.   Patient is at home, and I am at the office.   Persons attending the telehealth visit: the patient and I.   Pt returns for f/u of diabetes mellitus: DM type: 2 Dx'ed: 1937 Complications: renal insuff.  Therapy: 3 oral meds GDM: never DKA: never Severe hypoglycemia: never.  Pancreatitis: never.  Other: she has never taken insulin; she did not tolerate farxiga (rash).   Interval history: pt states she feels well in general.  She takes meds as rx'ed, and tolerates well.  She says cbg's vary from 113-158.    Past Medical History:  Diagnosis Date  . Allergy   . Asthma   . Diabetes mellitus without complication ([place])   . 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 Fibrocystic breast disease   . GERD (gastroesophageal reflux disease)   . History of endometriosis   . Hyperlipidemia 06/29/2011  . Preeclampsia     Past Surgical History:  Procedure Laterality Date  . ABDOMINAL ADHESION SURGERY  07/2004  . CESAREAN SECTION  02/2004  . DILATION AND CURETTAGE, DIAGNOSTIC / THERAPEUTIC      Social History   Socioeconomic History  . Marital status: Legally Separated    Spouse name: Not on file  . Number of children: Not on file  . Years of education: Not on file  . Highest education level: Not on file  Occupational History  . Not on file  Social Needs  . Financial resource strain: Not on file  . Food insecurity:    Worry: Not on file    Inability: Not on file  . Transportation needs:    Medical: Not on file    Non-medical: Not on file  Tobacco Use  . Smoking status: Never Smoker  . Smokeless tobacco: Never Used  Substance and Sexual Activity   . Alcohol use: No  . Drug use: No  . Sexual activity: Not on file  Lifestyle  . Physical activity:    Days per week: Not on file    Minutes per session: Not on file  . Stress: Not on file  Relationships  . Social connections:    Talks on phone: Not on file    Gets together: Not on file    Attends religious service: Not on file    Active member of club or organization: Not on file    Attends meetings of clubs or organizations: Not on file    Relationship status: Not on file  . Intimate partner violence:    Fear of current or ex partner: Not on file    Emotionally abused: Not on file    Physically abused: Not on file    Forced sexual activity: Not on file  Other Topics Concern  . Not on file  Social History Narrative  . Not on file    Current Outpatient Medications on File Prior to Visit  Medication Sig Dispense Refill  . albuterol (PROAIR HFA) 108 (90 Base) MCG/ACT inhaler USE 2 PUFFS 4 TIMES DAILY AS NEEDED 8.5 each 2  . aspirin EC 81 MG tablet Take 1 tablet (81 mg total) by  mouth daily. 90 tablet 11  . clonazePAM (KLONOPIN) 0.5 MG tablet Take 1 tablet (0.5 mg total) by mouth 2 (two) times daily as needed for anxiety. 60 tablet 1  . cyclobenzaprine (FLEXERIL) 5 MG tablet TAKE 1 TABLET (5 MG TOTAL) BY MOUTH 3 (THREE) TIMES DAILY AS NEEDED FOR MUSCLE SPASMS. 60 tablet 0  . Diclofenac Sodium (PENNSAID) 2 % SOLN Place 2 g onto the skin 2 (two) times daily. 112 g 3  . empagliflozin (JARDIANCE) 10 MG TABS tablet Take 10 mg by mouth daily. 30 tablet 11  . famotidine (PEPCID) 20 MG tablet Take 1 tablet (20 mg total) by mouth 2 (two) times daily. 180 tablet 3  . Fluticasone-Salmeterol (ADVAIR DISKUS) 250-50 MCG/DOSE AEPB USE 1 PUFF TWO TIMES DAILY AS NEEDED 180 each 3  . glucose blood (ONE TOUCH ULTRA TEST) test strip Use to monitor glucose levels once per day 50 each 12  . lactulose (CHRONULAC) 10 GM/15ML solution Take 45 mLs (30 g total) by mouth daily as needed for mild constipation.  236 mL 11  . Multiple Vitamins-Calcium (ONE-A-DAY WOMENS PO) Take by mouth.    . NONFORMULARY OR COMPOUNDED ITEM [facility]:  Wart Cream - Cimetidine 2%, Deoxy-D-Glucose 0.2%, Fluorouracil-5 5%, Salicylic Acid 96%, apply to affected areas daily. 120 each 2  . norethindrone (AYGESTIN) 5 MG tablet Take 5 mg by mouth daily.  10  . NORETHINDRONE ACETATE PO Take by mouth.      Glory Rosebush DELICA LANCETS 75F MISC Use to monitor glucose levels qd 100 each 1  . pantoprazole (PROTONIX) 40 MG tablet Take 1 tablet (40 mg total) by mouth 2 (two) times daily before a meal. 180 tablet 3  . polyethylene glycol powder (GLYCOLAX/MIRALAX) 17 GM/SCOOP powder Take 17 g by mouth 2 (two) times daily as needed. 3350 g 1  . SitaGLIPtin-MetFORMIN HCl (JANUMET XR) 50-1000 MG TB24 Take 1 tablet by mouth daily. 90 tablet 3  . triamcinolone cream (KENALOG) 0.1 % Apply 1 application topically 3 (three) times daily as needed. For rash 45 g 0  . Vitamin D, Ergocalciferol, (DRISDOL) 1.25 MG (50000 UT) CAPS capsule TAKE 1 CAPSULE (50,000 UNITS TOTAL) BY MOUTH EVERY 7 (SEVEN) DAYS. 12 capsule 0   No current facility-administered medications on file prior to visit.     Allergies  Allergen Reactions  . Hydrocodone   . Sulfonamide Derivatives     Family History  Problem Relation Age of Onset  . Diabetes Mother   . Hypertension Mother   . Asthma Mother   . Allergies Mother   . Diabetes Father   . Heart disease Paternal Uncle   . Asthma Maternal Grandmother   . Allergies Maternal Grandmother   . Cancer Paternal Grandmother        Brain and Lung    There were no vitals taken for this visit.   Review of Systems She denies hypoglycemia.      Objective:   Physical Exam   Lab Results  Component Value Date   CREATININE 1.24 (H) 03/11/2018   BUN 11 03/11/2018   NA 141 03/11/2018   K 3.9 03/11/2018   CL 105 03/11/2018   CO2 25 03/11/2018       Assessment & Plan:  Type 2 DM: apparently well-controlled.   she declines a1c now Renal insuff: This limits rx options  Patient Instructions  Please continue the same diabetes medications.   check your blood sugar once a day.  vary the time of day  when you check, between before the 3 meals, and at bedtime.  also check if you have symptoms of your blood sugar being too high or too low.  please keep a record of the readings and bring it to your next appointment here (or you can bring the meter itself).  You can write it on any piece of paper.  please call us sooner if your blood sugar goes below 70, or if you have a lot of readings over 200. Please come back for a follow-up appointment in 3 months.

## 2019-02-02 NOTE — Patient Instructions (Addendum)
Please continue the same diabetes medications.   check your blood sugar once a day.  vary the time of day when you check, between before the 3 meals, and at bedtime.  also check if you have symptoms of your blood sugar being too high or too low.  please keep a record of the readings and bring it to your next appointment here (or you can bring the meter itself).  You can write it on any piece of paper.  please call us sooner if your blood sugar goes below 70, or if you have a lot of readings over 200. Please come back for a follow-up appointment in 3 months.

## 2019-02-04 ENCOUNTER — Other Ambulatory Visit: Payer: Self-pay | Admitting: Internal Medicine

## 2019-02-21 ENCOUNTER — Ambulatory Visit: Payer: Self-pay

## 2019-02-21 DIAGNOSIS — J3489 Other specified disorders of nose and nasal sinuses: Secondary | ICD-10-CM

## 2019-02-21 NOTE — Telephone Encounter (Signed)
Pt. Reports seen in May with sinus infection and had a growth inside right side of nose. Had a CT scan of sinus. Still there and is tender. Spoke with Gareth Eagle and will send triage for review.  Answer Assessment - Initial Assessment Questions 1. APPEARANCE of LESION: "What does it look like?"      [place] shaped 2. SIZE: "How big is it?" (e.g., compare to size of pinhead, tip of pen, eraser, coin, pea, grape, ping pong ball; or size in cms or inches)      Pencil lead 3. COLOR: "What color is it?" "Is there more than one color?"     Dark brown 4. SHAPE: "What shape is it?" (e.g., round, irregular)     Cone shape 5. RAISED: "Does it stick up above the skin or is it flat?" (e.g., raised or elevated)     Raised 6. TENDER: "Does it hurt when you touch it?"  (Scale 1-10; or mild, moderate, severe)     Tender when blowing nose 7. LOCATION: "Where is it located?"      Right side - nare 8. ONSET: "When did it first appear?"      May 9. NUMBER: "Is there just one?" or "Are there others?"     One 10. CAUSE: "What do you think it is?"       Growth 11. OTHER SYMPTOMS: "Do you have any other symptoms?" (e.g., fever)       No 12. PREGNANCY: "Is there any chance you are pregnant?" "When was your last menstrual period?"       No  Protocols used: [place]

## 2019-02-21 NOTE — Telephone Encounter (Signed)
There was no growth or mass seen on the CT scan.  I can refer to ENT, but maybe Allergy would be better if the "mass" she is feeling is most likely referring to swelling probably from allergies

## 2019-02-21 NOTE — Telephone Encounter (Signed)
Does pt need another appointment or what would you suggest? Possible referrral?

## 2019-02-21 NOTE — Telephone Encounter (Signed)
Pt called, triaged earlier

## 2019-02-22 NOTE — Addendum Note (Signed)
Addended by: Biagio Borg on: 02/22/2019 10:21 AM   Modules accepted: Orders

## 2019-02-22 NOTE — Telephone Encounter (Signed)
Called pt, LVM.   

## 2019-02-22 NOTE — Telephone Encounter (Signed)
[place] for referral ent

## 2019-02-22 NOTE — Telephone Encounter (Signed)
Pt called back and would like to proceed with the referral to ENT.

## 2019-03-15 ENCOUNTER — Other Ambulatory Visit: Payer: Self-pay

## 2019-03-15 ENCOUNTER — Other Ambulatory Visit (INDEPENDENT_AMBULATORY_CARE_PROVIDER_SITE_OTHER): Payer: BC Managed Care – PPO

## 2019-03-15 ENCOUNTER — Encounter: Payer: Self-pay | Admitting: Family Medicine

## 2019-03-15 ENCOUNTER — Ambulatory Visit (INDEPENDENT_AMBULATORY_CARE_PROVIDER_SITE_OTHER): Payer: BC Managed Care – PPO | Admitting: Family Medicine

## 2019-03-15 VITALS — BP 100/64 | HR 86 | Ht 65.0 in | Wt 178.0 lb

## 2019-03-15 DIAGNOSIS — M25512 Pain in left shoulder: Secondary | ICD-10-CM

## 2019-03-15 DIAGNOSIS — M2550 Pain in unspecified joint: Secondary | ICD-10-CM | POA: Diagnosis not present

## 2019-03-15 DIAGNOSIS — M999 Biomechanical lesion, unspecified: Secondary | ICD-10-CM

## 2019-03-15 LAB — IBC PANEL
Iron: 103 ug/dL (ref 42–145)
Saturation Ratios: 25.3 % (ref 20.0–50.0)
Transferrin: 291 mg/dL (ref 212.0–360.0)

## 2019-03-15 LAB — CBC WITH DIFFERENTIAL/PLATELET
Basophils Absolute: 0 10*3/uL (ref 0.0–0.1)
Basophils Relative: 0.7 % (ref 0.0–3.0)
Eosinophils Absolute: 0.1 10*3/uL (ref 0.0–0.7)
Eosinophils Relative: 1.6 % (ref 0.0–5.0)
HCT: 45 % (ref 36.0–46.0)
Hemoglobin: 15.5 g/dL — ABNORMAL HIGH (ref 12.0–15.0)
Lymphocytes Relative: 32.8 % (ref 12.0–46.0)
Lymphs Abs: 2.2 10*3/uL (ref 0.7–4.0)
MCHC: 34.5 g/dL (ref 30.0–36.0)
MCV: 88.5 fl (ref 78.0–100.0)
Monocytes Absolute: 0.5 10*3/uL (ref 0.1–1.0)
Monocytes Relative: 7.2 % (ref 3.0–12.0)
Neutro Abs: 3.9 10*3/uL (ref 1.4–7.7)
Neutrophils Relative %: 57.7 % (ref 43.0–77.0)
Platelets: 269 10*3/uL (ref 150.0–400.0)
RBC: 5.08 Mil/uL (ref 3.87–5.11)
RDW: 14.1 % (ref 11.5–15.5)
WBC: 6.8 10*3/uL (ref 4.0–10.5)

## 2019-03-15 LAB — COMPREHENSIVE METABOLIC PANEL
ALT: 20 U/L (ref 0–35)
AST: 17 U/L (ref 0–37)
Albumin: 4.5 g/dL (ref 3.5–5.2)
Alkaline Phosphatase: 68 U/L (ref 39–117)
BUN: 9 mg/dL (ref 6–23)
CO2: 25 mEq/L (ref 19–32)
Calcium: 9.6 mg/dL (ref 8.4–10.5)
Chloride: 105 mEq/L (ref 96–112)
Creatinine, Ser: 1.16 mg/dL (ref 0.40–1.20)
GFR: 60.45 mL/min (ref >60.00)
Glucose, Bld: 108 mg/dL — ABNORMAL HIGH (ref 70–99)
Potassium: 4.1 mEq/L (ref 3.5–5.1)
Sodium: 140 mEq/L (ref 135–145)
Total Bilirubin: 0.7 mg/dL (ref 0.2–1.2)
Total Protein: 7.6 g/dL (ref 6.0–8.3)

## 2019-03-15 LAB — FERRITIN: Ferritin: 55.7 ng/mL (ref 10.0–291.0)

## 2019-03-15 LAB — C-REACTIVE PROTEIN: CRP: <1 mg/dL (ref 0.5–20.0)

## 2019-03-15 LAB — SEDIMENTATION RATE: Sed Rate: 13 mm/hr (ref 0–20)

## 2019-03-15 LAB — TSH: TSH: 1.97 u[IU]/mL (ref 0.35–4.50)

## 2019-03-15 LAB — URIC ACID: Uric Acid, Serum: 5.1 mg/dL (ref 2.4–7.0)

## 2019-03-15 LAB — VITAMIN D 25 HYDROXY (VIT D DEFICIENCY, FRACTURES): VITD: 48.12 ng/mL (ref 30.00–100.00)

## 2019-03-15 LAB — TESTOSTERONE: Testosterone: 7.32 ng/dL — ABNORMAL LOW (ref 15.00–40.00)

## 2019-03-15 NOTE — Progress Notes (Signed)
[facility] [address] [place], [place] [address] Phone: [phone] Subjective:   Claire Claire Irwin, am serving as a scribe for Dr. Hulan Saas.  I'm seeing this patient by the request  of:   Biagio Borg, MD   CC: Neck and shoulder pain follow-up  [id number]   10/11/2018: Seems to be doing very well.  Discussed icing regimen and home exercise.  As long as patient continues to have full range of motion I think we do not need to do any more aggressive therapies.  Follow-up again in 6 to 8 weeks  Update 03/15/2019: Claire Claire Irwin is a 48 y.o. female coming in with complaint of neck and shoulder pain. Patient states that she is having pain in all joints in her body. Is having increasing pain in shoulders and neck specifically. OMT performed last visit which did help. Mentions pain in wrists, hands, ankles, back and shoulders.  Patient states that the severe pain seems to be a little better but unfortunately having more diffuse pain in more of her joints.    Past Medical History:  Diagnosis Date  . Allergy   . Asthma   . Diabetes mellitus without complication ([place])   . 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 Fibrocystic breast disease   . GERD (gastroesophageal reflux disease)   . History of endometriosis   . Hyperlipidemia 06/29/2011  . Preeclampsia    Past Surgical History:  Procedure Laterality Date  . ABDOMINAL ADHESION SURGERY  07/2004  . CESAREAN SECTION  02/2004  . DILATION AND CURETTAGE, DIAGNOSTIC / THERAPEUTIC     Social History   Socioeconomic History  . Marital status: Legally Separated    Spouse name: Not on file  . Number of children: Not on file  . Years of education: Not on file  . Highest education level: Not on file  Occupational History  . Not on file  Social Needs  . Financial resource strain: Not on file  . Food insecurity    Worry: Not on file    Inability: Not on file  . Transportation needs   Medical: Not on file    Non-medical: Not on file  Tobacco Use  . Smoking status: Never Smoker  . Smokeless tobacco: Never Used  Substance and Sexual Activity  . Alcohol use: Claire Irwin  . Drug use: Claire Irwin  . Sexual activity: Not on file  Lifestyle  . Physical activity    Days per week: Not on file    Minutes per session: Not on file  . Stress: Not on file  Relationships  . Social Herbalist on phone: Not on file    Gets together: Not on file    Attends religious service: Not on file    Active member of club or organization: Not on file    Attends meetings of clubs or organizations: Not on file    Relationship status: Not on file  Other Topics Concern  . Not on file  Social History Narrative  . Not on file   Allergies  Allergen Reactions  . Hydrocodone   . Sulfonamide Derivatives    Family History  Problem Relation Age of Onset  . Diabetes Mother   . Hypertension Mother   . Asthma Mother   . Allergies Mother   . Diabetes Father   . Heart disease Paternal Uncle   . Asthma Maternal Grandmother   . Allergies Maternal Grandmother   .  Cancer Paternal Grandmother        Brain and Lung    Current Outpatient Medications (Endocrine & Metabolic):  .  empagliflozin (JARDIANCE) 10 MG TABS tablet, Take 10 mg by mouth daily. .  norethindrone (AYGESTIN) 5 MG tablet, Take 5 mg by mouth daily. .  NORETHINDRONE ACETATE PO, Take by mouth.   .  SitaGLIPtin-MetFORMIN HCl (JANUMET XR) 50-1000 MG TB24, Take 1 tablet by mouth daily.   Current Outpatient Medications (Respiratory):  .  albuterol (PROAIR HFA) 108 (90 Base) MCG/ACT inhaler, USE 2 PUFFS 4 TIMES DAILY AS NEEDED .  Fluticasone-Salmeterol (ADVAIR DISKUS) 250-50 MCG/DOSE AEPB, USE 1 PUFF TWO TIMES DAILY AS NEEDED  Current Outpatient Medications (Analgesics):  .  aspirin EC 81 MG tablet, Take 1 tablet (81 mg total) by mouth daily.   Current Outpatient Medications (Other):  .  clonazePAM (KLONOPIN) 0.5 MG tablet, Take 1  tablet (0.5 mg total) by mouth 2 (two) times daily as needed for anxiety. .  cyclobenzaprine (FLEXERIL) 5 MG tablet, TAKE 1 TABLET (5 MG TOTAL) BY MOUTH 3 (THREE) TIMES DAILY AS NEEDED FOR MUSCLE SPASMS. .  Diclofenac Sodium (PENNSAID) 2 % SOLN, Place 2 g onto the skin 2 (two) times daily. .  famotidine (PEPCID) 20 MG tablet, Take 1 tablet (20 mg total) by mouth 2 (two) times daily. Marland Kitchen  glucose blood (ONE TOUCH ULTRA TEST) test strip, Use to monitor glucose levels once per day .  lactulose (CHRONULAC) 10 GM/15ML solution, Take 45 mLs (30 g total) by mouth daily as needed for mild constipation. .  Multiple Vitamins-Calcium (ONE-A-DAY WOMENS PO), Take by mouth. .  NONFORMULARY OR COMPOUNDED ITEM, [facility]:  Wart Cream - Cimetidine 2%, Deoxy-D-Glucose 0.2%, Fluorouracil-5 5%, Salicylic Acid 32%, apply to affected areas daily. Claire Claire Irwin DELICA LANCETS 20U MISC, Use to monitor glucose levels qd .  pantoprazole (PROTONIX) 40 MG tablet, Take 1 tablet (40 mg total) by mouth 2 (two) times daily before a meal. .  polyethylene glycol powder (GLYCOLAX/MIRALAX) 17 GM/SCOOP powder, Take 17 g by mouth 2 (two) times daily as needed. .  triamcinolone cream (KENALOG) 0.1 %, APPLY 1 APPLICATION TOPICALLY 3 (THREE) TIMES DAILY AS NEEDED. FOR RASH .  Vitamin D, Ergocalciferol, (DRISDOL) 1.25 MG (50000 UT) CAPS capsule, TAKE 1 CAPSULE (50,000 UNITS TOTAL) BY MOUTH EVERY 7 (SEVEN) DAYS.    Past medical history, social, surgical and family history all reviewed in electronic medical record.  Claire Irwin pertanent information unless stated regarding to the chief complaint.   Review of Systems:  Claire Irwin headache, visual changes, nausea, vomiting, diarrhea, constipation, dizziness, abdominal pain, skin rash, fevers, chills, night sweats, weight loss, swollen lymph nodes, body aches, joint swelling, chest pain, shortness of breath, mood changes.  Positive muscle aches  Objective  Blood pressure 100/64, pulse 86, height 5\' 5"   (1.651 m), weight 178 lb (80.7 kg), SpO2 99 %.   General: Claire Irwin apparent distress alert and oriented x3 mood and affect normal, dressed appropriately.  HEENT: Pupils equal, extraocular movements intact  Respiratory: Patient's speak in full sentences and does not appear short of breath  Cardiovascular: Claire Irwin lower extremity edema, non tender, Claire Irwin erythema  Skin: Warm dry intact with Claire Irwin signs of infection or rash on extremities or on axial skeleton.  Abdomen: Soft nontender  Neuro: Cranial nerves II through XII are intact, neurovascularly intact in all extremities with 2+ DTRs and 2+ pulses.  Lymph: Claire Irwin lymphadenopathy of posterior or anterior cervical chain or axillae bilaterally.  Gait  normal with good balance and coordination.  MSK:  Non tender with full range of motion and good stability and symmetric strength and tone of elbows, wrist, hip, knee and ankles bilaterally.  Shoulder: Right Inspection reveals Claire Irwin abnormalities, atrophy or asymmetry. Palpation is normal with Claire Irwin tenderness over AC joint or bicipital groove. ROM is full in all planes. Rotator cuff strength normal throughout. Claire Irwin signs of impingement with negative Neer and Hawkin's tests, empty can sign. Speeds and Yergason's tests normal. Claire Irwin labral pathology noted with negative Obrien's, negative clunk and good stability. Normal scapular function observed. Claire Irwin painful arc and Claire Irwin drop arm sign. Claire Irwin apprehension sign Significant tightness between the parascapular regions.  Neck: Inspection unremarkable. Claire Irwin palpable stepoffs. Negative Spurling's maneuver. Mild limited range of motion with of the neck lacking last 5 degrees of rotation bilaterally Grip strength and sensation normal in bilateral hands Strength good C4 to T1 distribution Claire Irwin sensory change to C4 to T1 Negative Hoffman sign bilaterally Reflexes normal Tightness in the parascapular region  Osteopathic findings  C4 flexed rotated and side bent right  C6 flexed rotated  and side bent left T3 extended rotated and side bent right inhaled third rib T7 extended rotated and side bent left L1 flexed rotated and side bent right Sacrum right on right    Impression and Recommendations:     This case required medical decision making of moderate complexity. The above documentation has been reviewed and is accurate and complete Lyndal Pulley, DO       Note: This dictation was prepared with Dragon dictation along with smaller phrase technology. Any transcriptional errors that result from this process are unintentional.

## 2019-03-15 NOTE — Assessment & Plan Note (Signed)
Decision today to treat with OMT was based on Physical Exam  After verbal consent patient was treated with HVLA, ME, FPR techniques in cervical, thoracic, rib lumbar and sacral areas  Patient tolerated the procedure well with improvement in symptoms  Patient given exercises, stretches and lifestyle modifications  See medications in patient instructions if given  Patient will follow up in 4-8 weeks 

## 2019-03-15 NOTE — Assessment & Plan Note (Signed)
Likely more secondary to poor posture.  Patient does have been significant amount of polyarthralgia and laboratory work-up ordered today.  Responded fairly well to osteopathic manipulation.  Discussed posture and ergonomics.  Discussed which activities of doing which wants to avoid.  Encourage weight loss.  Follow-up again in 4 to 8 weeks

## 2019-03-15 NOTE — Patient Instructions (Signed)
You are important to me too Keep up the exercises an vitamins [facility] downstairs  See me again in 3-6 weeks

## 2019-03-17 LAB — CALCIUM, IONIZED: Calcium, Ion: 5.29 mg/dL (ref 4.8–5.6)

## 2019-03-17 LAB — RHEUMATOID FACTOR: Rheumatoid fact SerPl-aCnc: <14 IU/mL (ref <14)

## 2019-03-17 LAB — PTH, INTACT AND CALCIUM
Calcium: 9.7 mg/dL (ref 8.6–10.2)
PTH: 25 pg/mL (ref 14–64)

## 2019-03-17 LAB — ANA: Anti Nuclear Antibody (ANA): NEGATIVE

## 2019-03-17 LAB — CYCLIC CITRUL PEPTIDE ANTIBODY, IGG: Cyclic Citrullin Peptide Ab: <16 UNITS

## 2019-03-17 LAB — ANGIOTENSIN CONVERTING ENZYME: Angiotensin-Converting Enzyme: 29 U/L (ref 9–67)

## 2019-03-28 ENCOUNTER — Ambulatory Visit: Payer: BC Managed Care – PPO | Admitting: Family Medicine

## 2019-04-03 DIAGNOSIS — B079 Viral wart, unspecified: Secondary | ICD-10-CM | POA: Insufficient documentation

## 2019-04-05 ENCOUNTER — Other Ambulatory Visit: Payer: Self-pay

## 2019-04-05 ENCOUNTER — Encounter: Payer: Self-pay | Admitting: Family Medicine

## 2019-04-05 ENCOUNTER — Ambulatory Visit (INDEPENDENT_AMBULATORY_CARE_PROVIDER_SITE_OTHER): Payer: BC Managed Care – PPO | Admitting: Family Medicine

## 2019-04-05 VITALS — BP 102/72 | HR 96 | Ht 65.0 in

## 2019-04-05 DIAGNOSIS — G8929 Other chronic pain: Secondary | ICD-10-CM | POA: Diagnosis not present

## 2019-04-05 DIAGNOSIS — M545 Low back pain: Secondary | ICD-10-CM | POA: Diagnosis not present

## 2019-04-05 DIAGNOSIS — M999 Biomechanical lesion, unspecified: Secondary | ICD-10-CM | POA: Diagnosis not present

## 2019-04-05 NOTE — Progress Notes (Signed)
[name] [place],  [address] Phone: [phone] Subjective:   Claire Irwin, am serving as a scribe for Dr. Hulan Saas.  CC: Neck and shoulder pain follow-up  WCB:[id number]   03/15/2019 Likely more secondary to poor posture.  Patient does have been significant amount of polyarthralgia and laboratory work-up ordered today.  Responded fairly well to osteopathic manipulation.  Discussed posture and ergonomics.  Discussed which activities of doing which wants to avoid.  Encourage weight loss.  Follow-up again in 4 to 8 weeks  Update 04/05/2019 Claire Irwin is a 48 y.o. female coming in with complaint of left shoulder pain. Patient states that she is doing well since last visit. Was having tightness in shoulders but not bad today. Does feel that OMT is helping to manage her pain.  Patient states that able to do daily activities.  Patient is returning to work next week and is concerned this.  Will be more schoolwork than she is used to.  Will be doing is with her sitting and on the computer as well.  Causing some increasing stress.     Past Medical History:  Diagnosis Date  . Allergy   . Asthma   . Diabetes mellitus without complication ([place])   . 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 Fibrocystic breast disease   . GERD (gastroesophageal reflux disease)   . History of endometriosis   . Hyperlipidemia 06/29/2011  . Preeclampsia    Past Surgical History:  Procedure Laterality Date  . ABDOMINAL ADHESION SURGERY  07/2004  . CESAREAN SECTION  02/2004  . DILATION AND CURETTAGE, DIAGNOSTIC / THERAPEUTIC     Social History   Socioeconomic History  . Marital status: Legally Separated    Spouse name: Not on file  . Number of children: Not on file  . Years of education: Not on file  . Highest education level: Not on file  Occupational History  . Not on file  Social Needs  . Financial resource strain: Not on file   . Food insecurity    Worry: Not on file    Inability: Not on file  . Transportation needs    Medical: Not on file    Non-medical: Not on file  Tobacco Use  . Smoking status: Never Smoker  . Smokeless tobacco: Never Used  Substance and Sexual Activity  . Alcohol use: Irwin  . Drug use: Irwin  . Sexual activity: Not on file  Lifestyle  . Physical activity    Days per week: Not on file    Minutes per session: Not on file  . Stress: Not on file  Relationships  . Social Herbalist on phone: Not on file    Gets together: Not on file    Attends religious service: Not on file    Active member of club or organization: Not on file    Attends meetings of clubs or organizations: Not on file    Relationship status: Not on file  Other Topics Concern  . Not on file  Social History Narrative  . Not on file   Allergies  Allergen Reactions  . Hydrocodone   . Sulfonamide Derivatives    Family History  Problem Relation Age of Onset  . Diabetes Mother   . Hypertension Mother   . Asthma Mother   . Allergies Mother   . Diabetes Father   . Heart disease Paternal Uncle   .  Asthma Maternal Grandmother   . Allergies Maternal Grandmother   . Cancer Paternal Grandmother        Brain and Lung    Current Outpatient Medications (Endocrine & Metabolic):  .  empagliflozin (JARDIANCE) 10 MG TABS tablet, Take 10 mg by mouth daily. .  norethindrone (AYGESTIN) 5 MG tablet, Take 5 mg by mouth daily. .  NORETHINDRONE ACETATE PO, Take by mouth.   .  SitaGLIPtin-MetFORMIN HCl (JANUMET XR) 50-1000 MG TB24, Take 1 tablet by mouth daily.   Current Outpatient Medications (Respiratory):  .  albuterol (PROAIR HFA) 108 (90 Base) MCG/ACT inhaler, USE 2 PUFFS 4 TIMES DAILY AS NEEDED .  Fluticasone-Salmeterol (ADVAIR DISKUS) 250-50 MCG/DOSE AEPB, USE 1 PUFF TWO TIMES DAILY AS NEEDED  Current Outpatient Medications (Analgesics):  .  aspirin EC 81 MG tablet, Take 1 tablet (81 mg total) by mouth daily.    Current Outpatient Medications (Other):  .  clonazePAM (KLONOPIN) 0.5 MG tablet, Take 1 tablet (0.5 mg total) by mouth 2 (two) times daily as needed for anxiety. .  cyclobenzaprine (FLEXERIL) 5 MG tablet, TAKE 1 TABLET (5 MG TOTAL) BY MOUTH 3 (THREE) TIMES DAILY AS NEEDED FOR MUSCLE SPASMS. .  Diclofenac Sodium (PENNSAID) 2 % SOLN, Place 2 g onto the skin 2 (two) times daily. .  famotidine (PEPCID) 20 MG tablet, Take 1 tablet (20 mg total) by mouth 2 (two) times daily. Marland Kitchen  glucose blood (ONE TOUCH ULTRA TEST) test strip, Use to monitor glucose levels once per day .  lactulose (CHRONULAC) 10 GM/15ML solution, Take 45 mLs (30 g total) by mouth daily as needed for mild constipation. .  Multiple Vitamins-Calcium (ONE-A-DAY WOMENS PO), Take by mouth. .  NONFORMULARY OR COMPOUNDED ITEM, [facility]:  Wart Cream - Cimetidine 2%, Deoxy-D-Glucose 0.2%, Fluorouracil-5 5%, Salicylic Acid 98%, apply to affected areas daily. Glory Rosebush DELICA LANCETS 11B MISC, Use to monitor glucose levels qd .  pantoprazole (PROTONIX) 40 MG tablet, Take 1 tablet (40 mg total) by mouth 2 (two) times daily before a meal. .  polyethylene glycol powder (GLYCOLAX/MIRALAX) 17 GM/SCOOP powder, Take 17 g by mouth 2 (two) times daily as needed. .  triamcinolone cream (KENALOG) 0.1 %, APPLY 1 APPLICATION TOPICALLY 3 (THREE) TIMES DAILY AS NEEDED. FOR RASH .  Vitamin D, Ergocalciferol, (DRISDOL) 1.25 MG (50000 UT) CAPS capsule, TAKE 1 CAPSULE (50,000 UNITS TOTAL) BY MOUTH EVERY 7 (SEVEN) DAYS.    Past medical history, social, surgical and family history all reviewed in electronic medical record.  Irwin pertanent information unless stated regarding to the chief complaint.   Review of Systems:  Irwin headache, visual changes, nausea, vomiting, diarrhea, constipation, dizziness, abdominal pain, skin rash, fevers, chills, night sweats, weight loss, swollen lymph nodes, body aches, joint swelling,  chest pain, shortness of breath, mood  changes.  Positive muscle aches  Objective  Blood pressure 102/72, pulse 96, height 5\' 5"  (1.651 m), SpO2 99 %.    General: Irwin apparent distress alert and oriented x3 mood and affect normal, dressed appropriately.  HEENT: Pupils equal, extraocular movements intact  Respiratory: Patient's speak in full sentences and does not appear short of breath  Cardiovascular: Irwin lower extremity edema, non tender, Irwin erythema  Skin: Warm dry intact with Irwin signs of infection or rash on extremities or on axial skeleton.  Abdomen: Soft nontender  Neuro: Cranial nerves II through XII are intact, neurovascularly intact in all extremities with 2+ DTRs and 2+ pulses.  Lymph: Irwin lymphadenopathy of posterior  or anterior cervical chain or axillae bilaterally.  Gait normal with good balance and coordination.  MSK:  Non tender with full range of motion and good stability and symmetric strength and tone of shoulders, elbows, wrist, hip, knee and ankles bilaterally.  Back exam still shows the patient does have some scapular dyskinesis.  Tenderness to palpation in the parascapular region bilaterally.  Mild increasing tightness of the paraspinal musculature lumbar spine and last exam.  Mild tenderness neuritis well.  Negative straight leg test.  Neurovascular intact in all extremities.  Osteopathic findings C2 flexed rotated and side bent right C7 flexed rotated and side bent left T3 extended rotated and side bent right inhaled third rib T6 extended rotated and side bent left L5 flexed rotated and side bent left Sacrum right on right     Impression and Recommendations:     This case required medical decision making of moderate complexity. The above documentation has been reviewed and is accurate and complete Lyndal Pulley, DO       Note: This dictation was prepared with Dragon dictation along with smaller phrase technology. Any transcriptional errors that result from this process are unintentional.

## 2019-04-05 NOTE — Assessment & Plan Note (Signed)
Decision today to treat with OMT was based on Physical Exam  After verbal consent patient was treated with HVLA, ME, FPR techniques in cervical, thoracic, rib lumbar and sacral areas  Patient tolerated the procedure well with improvement in symptoms  Patient given exercises, stretches and lifestyle modifications  See medications in patient instructions if given  Patient will follow up in 4-8 weeks 

## 2019-04-05 NOTE — Patient Instructions (Signed)
Compression socks daily could help Keep up with exercises Good luck with school See me in 4 weeks

## 2019-04-05 NOTE — Assessment & Plan Note (Signed)
Stable overall intermittent but mild increase in tightness.  I believe it is secondary to more of a tightness in her hamstrings.  Discussed with patient about posture and ergonomics, discussed working position and what could be more beneficial.  Patient will try to make these changes and follow-up with me again in 4 to 8 weeks.

## 2019-05-02 ENCOUNTER — Other Ambulatory Visit: Payer: Self-pay | Admitting: Internal Medicine

## 2019-05-02 DIAGNOSIS — R05 Cough: Secondary | ICD-10-CM

## 2019-05-02 DIAGNOSIS — R059 Cough, unspecified: Secondary | ICD-10-CM

## 2019-05-02 NOTE — Progress Notes (Signed)
[facility] [place] Ir[place], [place] [address] Phone: [phone] Subjective:   I Claire Irwin am serving as a Education administrator for Dr. Hulan Saas.  I'm seeing this patient by the request  of:    CC: Neck and back pain follow-up  [id number]  Claire Irwin is a 48 y.o. female coming in with complaint of back pain. Was doing good before she went back to work. Neck is mostly painful.  Patient has noticed tightness.  Shoulders are occasional.  Patient denies any numbness or tingling rates the severity of pain is 5 out of 10.     Past Medical History:  Diagnosis Date  . Allergy   . Asthma   . Diabetes mellitus without complication ([place])   . 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 Fibrocystic breast disease   . GERD (gastroesophageal reflux disease)   . History of endometriosis   . Hyperlipidemia 06/29/2011  . Preeclampsia    Past Surgical History:  Procedure Laterality Date  . ABDOMINAL ADHESION SURGERY  07/2004  . CESAREAN SECTION  02/2004  . DILATION AND CURETTAGE, DIAGNOSTIC / THERAPEUTIC     Social History   Socioeconomic History  . Marital status: Legally Separated    Spouse name: Not on file  . Number of children: Not on file  . Years of education: Not on file  . Highest education level: Not on file  Occupational History  . Not on file  Social Needs  . Financial resource strain: Not on file  . Food insecurity    Worry: Not on file    Inability: Not on file  . Transportation needs    Medical: Not on file    Non-medical: Not on file  Tobacco Use  . Smoking status: Never Smoker  . Smokeless tobacco: Never Used  Substance and Sexual Activity  . Alcohol use: No  . Drug use: No  . Sexual activity: Not on file  Lifestyle  . Physical activity    Days per week: Not on file    Minutes per session: Not on file  . Stress: Not on file  Relationships  . Social Herbalist on phone: Not on file    Gets  together: Not on file    Attends religious service: Not on file    Active member of club or organization: Not on file    Attends meetings of clubs or organizations: Not on file    Relationship status: Not on file  Other Topics Concern  . Not on file  Social History Narrative  . Not on file   Allergies  Allergen Reactions  . Hydrocodone   . Sulfonamide Derivatives    Family History  Problem Relation Age of Onset  . Diabetes Mother   . Hypertension Mother   . Asthma Mother   . Allergies Mother   . Diabetes Father   . Heart disease Paternal Uncle   . Asthma Maternal Grandmother   . Allergies Maternal Grandmother   . Cancer Paternal Grandmother        Brain and Lung    Current Outpatient Medications (Endocrine & Metabolic):  .  empagliflozin (JARDIANCE) 10 MG TABS tablet, Take 10 mg by mouth daily. .  norethindrone (AYGESTIN) 5 MG tablet, Take 5 mg by mouth daily. .  NORETHINDRONE ACETATE PO, Take by mouth.   .  SitaGLIPtin-MetFORMIN HCl (JANUMET XR) 50-1000 MG TB24, Take 1 tablet by mouth  daily.   Current Outpatient Medications (Respiratory):  .  albuterol (PROAIR HFA) 108 (90 Base) MCG/ACT inhaler, USE 2 PUFFS 4 TIMES DAILY AS NEEDED .  WIXELA INHUB 250-50 MCG/DOSE AEPB, USE 1 PUFF TWO TIMES DAILY AS NEEDED  Current Outpatient Medications (Analgesics):  .  aspirin EC 81 MG tablet, Take 1 tablet (81 mg total) by mouth daily.   Current Outpatient Medications (Other):  .  clonazePAM (KLONOPIN) 0.5 MG tablet, Take 1 tablet (0.5 mg total) by mouth 2 (two) times daily as needed for anxiety. .  cyclobenzaprine (FLEXERIL) 5 MG tablet, TAKE 1 TABLET (5 MG TOTAL) BY MOUTH 3 (THREE) TIMES DAILY AS NEEDED FOR MUSCLE SPASMS. .  Diclofenac Sodium (PENNSAID) 2 % SOLN, Place 2 g onto the skin 2 (two) times daily. .  famotidine (PEPCID) 20 MG tablet, Take 1 tablet (20 mg total) by mouth 2 (two) times daily. Marland Kitchen  glucose blood (ONE TOUCH ULTRA TEST) test strip, Use to monitor glucose  levels once per day .  lactulose (CHRONULAC) 10 GM/15ML solution, Take 45 mLs (30 g total) by mouth daily as needed for mild constipation. .  Multiple Vitamins-Calcium (ONE-A-DAY WOMENS PO), Take by mouth. .  NONFORMULARY OR COMPOUNDED ITEM, [facility]:  Wart Cream - Cimetidine 2%, Deoxy-D-Glucose 0.2%, Fluorouracil-5 5%, Salicylic Acid 0000000, apply to affected areas daily. Glory Rosebush DELICA LANCETS [id number] MISC, Use to monitor glucose levels qd .  pantoprazole (PROTONIX) 40 MG tablet, Take 1 tablet (40 mg total) by mouth 2 (two) times daily before a meal. .  polyethylene glycol powder (GLYCOLAX/MIRALAX) 17 GM/SCOOP powder, Take 17 g by mouth 2 (two) times daily as needed. .  triamcinolone cream (KENALOG) 0.1 %, APPLY 1 APPLICATION TOPICALLY 3 (THREE) TIMES DAILY AS NEEDED. FOR RASH .  Vitamin D, Ergocalciferol, (DRISDOL) 1.25 MG (50000 UT) CAPS capsule, TAKE 1 CAPSULE (50,000 UNITS TOTAL) BY MOUTH EVERY 7 (SEVEN) DAYS. Marland Kitchen  traZODone (DESYREL) 50 MG tablet, 1/2-1 tablet at bedtime    Past medical history, social, surgical and family history all reviewed in electronic medical record.  No pertanent information unless stated regarding to the chief complaint.   Review of Systems:  No headache, visual changes, nausea, vomiting, diarrhea, constipation, dizziness, abdominal pain, skin rash, fevers, chills, night sweats, weight loss, swollen lymph nodes, body aches, joint swelling,  chest pain, shortness of breath, mood changes.  Positive muscle aches  Objective  Blood pressure 110/84, pulse 96, height 5\' 5"  (1.651 m), weight 194 lb (88 kg), SpO2 98 %.    General: No apparent distress alert and oriented x3 mood and affect normal, dressed appropriately.  HEENT: Pupils equal, extraocular movements intact  Respiratory: Patient's speak in full sentences and does not appear short of breath  Cardiovascular: No lower extremity edema, non tender, no erythema  Skin: Warm dry intact with no signs of  infection or rash on extremities or on axial skeleton.  Abdomen: Soft nontender  Neuro: Cranial nerves II through XII are intact, neurovascularly intact in all extremities with 2+ DTRs and 2+ pulses.  Lymph: No lymphadenopathy of posterior or anterior cervical chain or axillae bilaterally.  Gait normal with good balance and coordination.  MSK:  Non tender with full range of motion and good stability and symmetric strength and tone of shoulders, elbows, wrist, hip, knee and ankles bilaterally. Neck exam shows loss of lordosis.  Tender to palpation diffusely in the paraspinal musculature of the lumbar spine right greater than left.  Low back exam has loss  of lordosis as well.  Tenderness on the right paraspinal musculature of the lumbar spine with a positive Faber negative straight leg test.  5 out of 5 strength of the lower extremities.  Osteopathic findings  C6 flexed rotated and side bent left T3 extended rotated and side bent right inhaled third rib T9 extended rotated and side bent left L2 flexed rotated and side bent right Sacrum right on right     Impression and Recommendations:     This case required medical decision making of moderate complexity. The above documentation has been reviewed and is accurate and complete Lyndal Pulley, DO       Note: This dictation was prepared with Dragon dictation along with smaller phrase technology. Any transcriptional errors that result from this process are unintentional.

## 2019-05-03 ENCOUNTER — Encounter: Payer: Self-pay | Admitting: Family Medicine

## 2019-05-03 ENCOUNTER — Other Ambulatory Visit: Payer: Self-pay

## 2019-05-03 ENCOUNTER — Ambulatory Visit: Payer: BC Managed Care – PPO | Admitting: Family Medicine

## 2019-05-03 VITALS — BP 110/84 | HR 96 | Ht 65.0 in | Wt 194.0 lb

## 2019-05-03 DIAGNOSIS — M542 Cervicalgia: Secondary | ICD-10-CM | POA: Diagnosis not present

## 2019-05-03 DIAGNOSIS — M999 Biomechanical lesion, unspecified: Secondary | ICD-10-CM | POA: Diagnosis not present

## 2019-05-03 MED ORDER — TRAZODONE HCL 50 MG PO TABS: ORAL_TABLET | ORAL | 0 refills | Status: DC

## 2019-05-03 NOTE — Assessment & Plan Note (Signed)
Neck pain secondary to posture and ergonomics.  Discussed with patient home exercise, icing discussed adjustable standing desk as well.  Responded well to manipulation.  Follow-up again in 4 to 8 weeks

## 2019-05-03 NOTE — Assessment & Plan Note (Signed)
Decision today to treat with OMT was based on Physical Exam  After verbal consent patient was treated with HVLA, ME, FPR techniques in cervical, thoracic, rib,  lumbar and sacral areas  Patient tolerated the procedure well with improvement in symptoms  Patient given exercises, stretches and lifestyle modifications  See medications in patient instructions if given  Patient will follow up in 4-8 weeks 

## 2019-05-03 NOTE — Patient Instructions (Signed)
Good to see you  See me again in 5-6 weeks  

## 2019-05-05 ENCOUNTER — Encounter: Payer: Self-pay | Admitting: Endocrinology

## 2019-05-05 ENCOUNTER — Ambulatory Visit: Payer: BC Managed Care – PPO | Admitting: Endocrinology

## 2019-05-05 ENCOUNTER — Other Ambulatory Visit: Payer: Self-pay

## 2019-05-05 VITALS — BP 90/60 | HR 102 | Temp 98.7°F | Ht 65.0 in | Wt 193.6 lb

## 2019-05-05 DIAGNOSIS — E119 Type 2 diabetes mellitus without complications: Secondary | ICD-10-CM

## 2019-05-05 LAB — POCT URINALYSIS DIPSTICK
Bilirubin, UA: NEGATIVE
Blood, UA: NEGATIVE
Glucose, UA: NEGATIVE
Ketones, UA: NEGATIVE
Nitrite, UA: NEGATIVE
Protein, UA: NEGATIVE
Spec Grav, UA: <=1.005 — AB (ref 1.010–1.025)
Urobilinogen, UA: 0.2 E.U./dL
pH, UA: 8.5 — AB (ref 5.0–8.0)

## 2019-05-05 LAB — POCT GLYCOSYLATED HEMOGLOBIN (HGB A1C): Hemoglobin A1C: 6.2 % — AB (ref 4.0–5.6)

## 2019-05-05 MED ORDER — CEFUROXIME AXETIL 500 MG PO TABS: 500.0000 mg | ORAL_TABLET | Freq: Two times a day (BID) | ORAL | 0 refills | Status: DC

## 2019-05-05 NOTE — Patient Instructions (Addendum)
Please continue the same diabetes medications.   check your blood sugar once a day.  vary the time of day when you check, between before the 3 meals, and at bedtime.  also check if you have symptoms of your blood sugar being too high or too low.  please keep a record of the readings and bring it to your next appointment here (or you can bring the meter itself).  You can write it on any piece of paper.  please call us sooner if your blood sugar goes below 70, or if you have a lot of readings over 200. I have sent a prescription to your pharmacy, for an antibiotic pill.  Please come back for a follow-up appointment in 3-4 months.

## 2019-05-05 NOTE — Progress Notes (Signed)
Subjective:    Patient ID: Claire Irwin, female    DOB: 02-10-1971, 48 y.o.   MRN: [id number]  HPI Pt returns for f/u of diabetes mellitus: DM type: 2 Dx'ed: 0000000 Complications: renal insuff.  Therapy: 3 oral meds GDM: never DKA: never Severe hypoglycemia: never.  Pancreatitis: never.  Other: she has never taken insulin; she did not tolerate farxiga (rash).   Interval history: pt states she feels well in general.  She takes meds as rx'ed, and tolerates well.  She says cbg's are in the 100's.   She has dysuria Past Medical History:  Diagnosis Date  . Allergy   . Asthma   . Diabetes mellitus without complication ([place])   . 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 Fibrocystic breast disease   . GERD (gastroesophageal reflux disease)   . History of endometriosis   . Hyperlipidemia 06/29/2011  . Preeclampsia     Past Surgical History:  Procedure Laterality Date  . ABDOMINAL ADHESION SURGERY  07/2004  . CESAREAN SECTION  02/2004  . DILATION AND CURETTAGE, DIAGNOSTIC / THERAPEUTIC      Social History   Socioeconomic History  . Marital status: Legally Separated    Spouse name: Not on file  . Number of children: Not on file  . Years of education: Not on file  . Highest education level: Not on file  Occupational History  . Not on file  Social Needs  . Financial resource strain: Not on file  . Food insecurity    Worry: Not on file    Inability: Not on file  . Transportation needs    Medical: Not on file    Non-medical: Not on file  Tobacco Use  . Smoking status: Never Smoker  . Smokeless tobacco: Never Used  Substance and Sexual Activity  . Alcohol use: No  . Drug use: No  . Sexual activity: Not on file  Lifestyle  . Physical activity    Days per week: Not on file    Minutes per session: Not on file  . Stress: Not on file  Relationships  . Social Herbalist on phone: Not on file    Gets together: Not on file    Attends religious  service: Not on file    Active member of club or organization: Not on file    Attends meetings of clubs or organizations: Not on file    Relationship status: Not on file  . Intimate partner violence    Fear of current or ex partner: Not on file    Emotionally abused: Not on file    Physically abused: Not on file    Forced sexual activity: Not on file  Other Topics Concern  . Not on file  Social History Narrative  . Not on file    Current Outpatient Medications on File Prior to Visit  Medication Sig Dispense Refill  . albuterol (PROAIR HFA) 108 (90 Base) MCG/ACT inhaler USE 2 PUFFS 4 TIMES DAILY AS NEEDED 8.5 each 2  . aspirin EC 81 MG tablet Take 1 tablet (81 mg total) by mouth daily. 90 tablet 11  . clonazePAM (KLONOPIN) 0.5 MG tablet Take 1 tablet (0.5 mg total) by mouth 2 (two) times daily as needed for anxiety. 60 tablet 1  . cyclobenzaprine (FLEXERIL) 5 MG tablet TAKE 1 TABLET (5 MG TOTAL) BY MOUTH 3 (THREE) TIMES DAILY AS NEEDED FOR MUSCLE SPASMS. 60 tablet 0  . Diclofenac  Sodium (PENNSAID) 2 % SOLN Place 2 g onto the skin 2 (two) times daily. 112 g 3  . empagliflozin (JARDIANCE) 10 MG TABS tablet Take 10 mg by mouth daily. 30 tablet 11  . famotidine (PEPCID) 20 MG tablet Take 1 tablet (20 mg total) by mouth 2 (two) times daily. 180 tablet 3  . glucose blood (ONE TOUCH ULTRA TEST) test strip Use to monitor glucose levels once per day 50 each 12  . lactulose (CHRONULAC) 10 GM/15ML solution Take 45 mLs (30 g total) by mouth daily as needed for mild constipation. 236 mL 11  . Multiple Vitamins-Calcium (ONE-A-DAY WOMENS PO) Take by mouth.    . NONFORMULARY OR COMPOUNDED ITEM [facility]:  Wart Cream - Cimetidine 2%, Deoxy-D-Glucose 0.2%, Fluorouracil-5 5%, Salicylic Acid 0000000, apply to affected areas daily. 120 each 2  . norethindrone (AYGESTIN) 5 MG tablet Take 5 mg by mouth daily.  10  . NORETHINDRONE ACETATE PO Take by mouth.      Glory Rosebush DELICA LANCETS 99991111 MISC Use to  monitor glucose levels qd 100 each 1  . pantoprazole (PROTONIX) 40 MG tablet Take 1 tablet (40 mg total) by mouth 2 (two) times daily before a meal. 180 tablet 3  . polyethylene glycol powder (GLYCOLAX/MIRALAX) 17 GM/SCOOP powder Take 17 g by mouth 2 (two) times daily as needed. 3350 g 1  . SitaGLIPtin-MetFORMIN HCl (JANUMET XR) 50-1000 MG TB24 Take 1 tablet by mouth daily. 90 tablet 3  . traZODone (DESYREL) 50 MG tablet 1/2-1 tablet at bedtime 30 tablet 0  . triamcinolone cream (KENALOG) 0.1 % APPLY 1 APPLICATION TOPICALLY 3 (THREE) TIMES DAILY AS NEEDED. FOR RASH 45 g 0  . Vitamin D, Ergocalciferol, (DRISDOL) 1.25 MG (50000 UT) CAPS capsule TAKE 1 CAPSULE (50,000 UNITS TOTAL) BY MOUTH EVERY 7 (SEVEN) DAYS. 12 capsule 0  . WIXELA INHUB 250-50 MCG/DOSE AEPB USE 1 PUFF TWO TIMES DAILY AS NEEDED 180 each 3   No current facility-administered medications on file prior to visit.     Allergies  Allergen Reactions  . Hydrocodone   . Sulfonamide Derivatives     Family History  Problem Relation Age of Onset  . Diabetes Mother   . Hypertension Mother   . Asthma Mother   . Allergies Mother   . Diabetes Father   . Heart disease Paternal Uncle   . Asthma Maternal Grandmother   . Allergies Maternal Grandmother   . Cancer Paternal Grandmother        Brain and Lung    BP 90/60   Pulse (!) 102   Temp 98.7 F (37.1 C) (Oral)   Ht 5\' 5"  (1.651 m)   Wt 193 lb 9.6 oz (87.8 kg)   SpO2 98%   BMI 32.22 kg/m   Review of Systems She denies hypoglycemia/fever/dizziness.      Objective:   Physical Exam VITAL SIGNS:  See vs page GENERAL: no distress.  Appears healthy Pulses: dorsalis pedis intact bilat.   MSK: no deformity of the feet CV: no leg edema Skin:  no ulcer on the feet.  normal color and temp on the feet. Neuro: sensation is intact to touch on the feet.    A1c=6.2%  Lab Results  Component Value Date   CREATININE 1.16 03/15/2019   BUN 9 03/15/2019   NA 140 03/15/2019   K 4.1  03/15/2019   CL 105 03/15/2019   CO2 25 03/15/2019   UA pos for WBC    Assessment & Plan:  Type 2 DM, with renal insuff.  UTI, new.   Patient Instructions  Please continue the same diabetes medications.   check your blood sugar once a day.  vary the time of day when you check, between before the 3 meals, and at bedtime.  also check if you have symptoms of your blood sugar being too high or too low.  please keep a record of the readings and bring it to your next appointment here (or you can bring the meter itself).  You can write it on any piece of paper.  please call us sooner if your blood sugar goes below 70, or if you have a lot of readings over 200. I have sent a prescription to your pharmacy, for an antibiotic pill.  Please come back for a follow-up appointment in 3-4 months.

## 2019-05-07 LAB — URINE CULTURE
MICRO NUMBER:: 849757
SPECIMEN QUALITY:: ADEQUATE

## 2019-05-17 ENCOUNTER — Ambulatory Visit: Payer: BC Managed Care – PPO | Admitting: Family Medicine

## 2019-05-26 ENCOUNTER — Other Ambulatory Visit: Payer: Self-pay | Admitting: Family Medicine

## 2019-05-29 ENCOUNTER — Telehealth: Payer: Self-pay | Admitting: Internal Medicine

## 2019-05-29 MED ORDER — LINACLOTIDE 72 MCG PO CAPS: 72.0000 ug | ORAL_CAPSULE | Freq: Every day | ORAL | 5 refills | Status: DC

## 2019-05-29 NOTE — Addendum Note (Signed)
Addended by: Biagio Borg on: 05/29/2019 01:04 PM   Modules accepted: Orders

## 2019-05-29 NOTE — Telephone Encounter (Signed)
Pt has been taking polyethylene glycol powder (GLYCOLAX/MIRALAX) 17 GM/SCOOP powder  But is not getting a complete clean out/ Pt will have a movement the next morning but the Pt would have a knot on the top of her stomach and she feels she cant get a complete clean out or relief/ Pt wants to know if she can get an Rx for Linzess that her and Dr. Jenny Reichmann spoke about before to see if that gives her more relief / please advise

## 2019-05-29 NOTE — Telephone Encounter (Signed)
[place] for linzess - done erx

## 2019-06-02 ENCOUNTER — Ambulatory Visit: Payer: BC Managed Care – PPO | Admitting: Family Medicine

## 2019-06-14 ENCOUNTER — Telehealth: Payer: Self-pay | Admitting: Endocrinology

## 2019-06-14 ENCOUNTER — Ambulatory Visit: Payer: BC Managed Care – PPO | Admitting: Family Medicine

## 2019-06-14 NOTE — Telephone Encounter (Signed)
Patient requests to be called at ph# [phone] re: Metformin concerns and guidance. Patient is available after 1:00 p.m. daily.

## 2019-06-14 NOTE — Telephone Encounter (Signed)
Returned pt call. States she is concerned about [employer] that metformin causes cancer. Redirected and educated pt that the information provided is misleading. According to Dr. Loanne Drilling, there are additives in specific lot #'s of Metformin that resulted in a recall. Advised pt to call her pharmacy to determine if her Metformin was a lot # that resulted in the massive recall. If so, advised she call so a new Rx could be sent to an alternate pharmacy. Otherwise, if not affected, the medication is safe to take according to Dr. Loanne Drilling. Verbalized acceptance and understanding.

## 2019-07-17 ENCOUNTER — Encounter: Payer: Self-pay | Admitting: Internal Medicine

## 2019-07-17 ENCOUNTER — Ambulatory Visit (INDEPENDENT_AMBULATORY_CARE_PROVIDER_SITE_OTHER): Payer: BC Managed Care – PPO | Admitting: Internal Medicine

## 2019-07-17 ENCOUNTER — Other Ambulatory Visit: Payer: Self-pay

## 2019-07-17 VITALS — BP 112/82 | HR 92 | Temp 99.2°F | Wt 192.0 lb

## 2019-07-17 DIAGNOSIS — E119 Type 2 diabetes mellitus without complications: Secondary | ICD-10-CM

## 2019-07-17 DIAGNOSIS — E559 Vitamin D deficiency, unspecified: Secondary | ICD-10-CM

## 2019-07-17 DIAGNOSIS — E785 Hyperlipidemia, unspecified: Secondary | ICD-10-CM | POA: Diagnosis not present

## 2019-07-17 DIAGNOSIS — E611 Iron deficiency: Secondary | ICD-10-CM

## 2019-07-17 DIAGNOSIS — E538 Deficiency of other specified B group vitamins: Secondary | ICD-10-CM

## 2019-07-17 DIAGNOSIS — Z0000 Encounter for general adult medical examination without abnormal findings: Secondary | ICD-10-CM | POA: Diagnosis not present

## 2019-07-17 NOTE — Assessment & Plan Note (Signed)
stable overall by history and exam, recent data reviewed with pt, and pt to continue medical treatment as before,  to f/u any worsening symptoms or concerns  

## 2019-07-17 NOTE — Assessment & Plan Note (Signed)

## 2019-07-17 NOTE — Patient Instructions (Signed)
Please continue all other medications as before, and refills have been done if requested.  Please have the pharmacy call with any other refills you may need.  Please continue your efforts at being more active, low cholesterol diet, and weight control.  You are otherwise up to date with prevention measures today.  Please keep your appointments with your specialists as you may have planned  Please return in 6 months, or sooner if needed, with Lab testing done 3-5 days before  

## 2019-07-17 NOTE — Progress Notes (Signed)
Subjective:    Patient ID: Claire Irwin, female    DOB: 1971/05/05, 48 y.o.   MRN: [id number]  HPI  Here for wellness and f/u;  Overall doing ok;  Pt denies Chest pain, worsening SOB, DOE, wheezing, orthopnea, PND, worsening LE edema, palpitations, dizziness or syncope.  Pt denies neurological change such as new headache, facial or extremity weakness.  Pt denies polydipsia, polyuria, or low sugar symptoms. Pt states overall good compliance with treatment and medications, good tolerability, and has been trying to follow appropriate diet.  Pt denies worsening depressive symptoms, suicidal ideation or panic. No fever, night sweats, wt loss, loss of appetite, or other constitutional symptoms.  Pt states good ability with ADL's, has low fall risk, home safety reviewed and adequate, no other significant changes in hearing or vision, and only occasionally active with exercise. No new complaints Past Medical History:  Diagnosis Date  . Allergy   . Asthma   . Diabetes mellitus without complication ([place])   . 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 Fibrocystic breast disease   . GERD (gastroesophageal reflux disease)   . History of endometriosis   . Hyperlipidemia 06/29/2011  . Preeclampsia    Past Surgical History:  Procedure Laterality Date  . ABDOMINAL ADHESION SURGERY  07/2004  . CESAREAN SECTION  02/2004  . DILATION AND CURETTAGE, DIAGNOSTIC / THERAPEUTIC      reports that she has never smoked. She has never used smokeless tobacco. She reports that she does not drink alcohol or use drugs. family history includes Allergies in her maternal grandmother and mother; Asthma in her maternal grandmother and mother; Cancer in her paternal grandmother; Diabetes in her father and mother; Heart disease in her paternal uncle; Hypertension in her mother. Allergies  Allergen Reactions  . Hydrocodone   . Sulfonamide Derivatives    Current Outpatient Medications on File Prior to Visit   Medication Sig Dispense Refill  . albuterol (PROAIR HFA) 108 (90 Base) MCG/ACT inhaler USE 2 PUFFS 4 TIMES DAILY AS NEEDED 8.5 each 2  . aspirin EC 81 MG tablet Take 1 tablet (81 mg total) by mouth daily. 90 tablet 11  . clonazePAM (KLONOPIN) 0.5 MG tablet Take 1 tablet (0.5 mg total) by mouth 2 (two) times daily as needed for anxiety. 60 tablet 1  . cyclobenzaprine (FLEXERIL) 5 MG tablet TAKE 1 TABLET (5 MG TOTAL) BY MOUTH 3 (THREE) TIMES DAILY AS NEEDED FOR MUSCLE SPASMS. 60 tablet 0  . Diclofenac Sodium (PENNSAID) 2 % SOLN Place 2 g onto the skin 2 (two) times daily. 112 g 3  . empagliflozin (JARDIANCE) 10 MG TABS tablet Take 10 mg by mouth daily. 30 tablet 11  . famotidine (PEPCID) 20 MG tablet Take 1 tablet (20 mg total) by mouth 2 (two) times daily. 180 tablet 3  . glucose blood (ONE TOUCH ULTRA TEST) test strip Use to monitor glucose levels once per day 50 each 12  . lactulose (CHRONULAC) 10 GM/15ML solution Take 45 mLs (30 g total) by mouth daily as needed for mild constipation. 236 mL 11  . linaclotide (LINZESS) 72 MCG capsule Take 1 capsule (72 mcg total) by mouth daily before breakfast. 30 capsule 5  . Multiple Vitamins-Calcium (ONE-A-DAY WOMENS PO) Take by mouth.    . NONFORMULARY OR COMPOUNDED ITEM [facility]:  Wart Cream - Cimetidine 2%, Deoxy-D-Glucose 0.2%, Fluorouracil-5 5%, Salicylic Acid 0000000, apply to affected areas daily. 120 each 2  . norethindrone (AYGESTIN) 5  MG tablet Take 5 mg by mouth daily.  10  . NORETHINDRONE ACETATE PO Take by mouth.      Glory Rosebush DELICA LANCETS 99991111 MISC Use to monitor glucose levels qd 100 each 1  . pantoprazole (PROTONIX) 40 MG tablet Take 1 tablet (40 mg total) by mouth 2 (two) times daily before a meal. 180 tablet 3  . polyethylene glycol powder (GLYCOLAX/MIRALAX) 17 GM/SCOOP powder Take 17 g by mouth 2 (two) times daily as needed. 3350 g 1  . SitaGLIPtin-MetFORMIN HCl (JANUMET XR) 50-1000 MG TB24 Take 1 tablet by mouth daily. 90 tablet 3   . traZODone (DESYREL) 50 MG tablet TAKE 1/2-1 TABLET AT BEDTIME 90 tablet 1  . triamcinolone cream (KENALOG) 0.1 % APPLY 1 APPLICATION TOPICALLY 3 (THREE) TIMES DAILY AS NEEDED. FOR RASH 45 g 0  . Vitamin D, Ergocalciferol, (DRISDOL) 1.25 MG (50000 UT) CAPS capsule TAKE 1 CAPSULE (50,000 UNITS TOTAL) BY MOUTH EVERY 7 (SEVEN) DAYS. 12 capsule 0  . WIXELA INHUB 250-50 MCG/DOSE AEPB USE 1 PUFF TWO TIMES DAILY AS NEEDED 180 each 3   No current facility-administered medications on file prior to visit.    Review of Systems Constitutional: Negative for other unusual diaphoresis, sweats, appetite or weight changes HENT: Negative for other worsening hearing loss, ear pain, facial swelling, mouth sores or neck stiffness.   Eyes: Negative for other worsening pain, redness or other visual disturbance.  Respiratory: Negative for other stridor or swelling Cardiovascular: Negative for other palpitations or other chest pain  Gastrointestinal: Negative for worsening diarrhea or loose stools, blood in stool, distention or other pain Genitourinary: Negative for hematuria, flank pain or other change in urine volume.  Musculoskeletal: Negative for myalgias or other joint swelling.  Skin: Negative for other color change, or other wound or worsening drainage.  Neurological: Negative for other syncope or numbness. Hematological: Negative for other adenopathy or swelling Psychiatric/Behavioral: Negative for hallucinations, other worsening agitation, SI, self-injury, or new decreased concentration All otherwise neg per pt    Objective:   Physical Exam BP 112/82   Pulse 92   Temp 99.2 F (37.3 C) (Oral)   Wt 192 lb (87.1 kg)   SpO2 98%   BMI 31.95 kg/m  VS noted,  Constitutional: Pt is oriented to person, place, and time. Appears well-developed and well-nourished, in no significant distress and comfortable Head: Normocephalic and atraumatic  Eyes: Conjunctivae and EOM are normal. Pupils are equal, round,  and reactive to light Right Ear: External ear normal without discharge Left Ear: External ear normal without discharge Nose: Nose without discharge or deformity Mouth/Throat: Oropharynx is without other ulcerations and moist  Neck: Normal range of motion. Neck supple. No JVD present. No tracheal deviation present or significant neck LA or mass Cardiovascular: Normal rate, regular rhythm, normal heart sounds and intact distal pulses.   Pulmonary/Chest: WOB normal and breath sounds without rales or wheezing  Abdominal: Soft. Bowel sounds are normal. NT. No HSM  Musculoskeletal: Normal range of motion. Exhibits no edema Lymphadenopathy: Has no other cervical adenopathy.  Neurological: Pt is alert and oriented to person, place, and time. Pt has normal reflexes. No cranial nerve deficit. Motor grossly intact, Gait intact Skin: Skin is warm and dry. No rash noted or new ulcerations Psychiatric:  Has normal mood and affect. Behavior is normal without agitation All otherwise neg per pt   Lab Results  Component Value Date   WBC 6.8 03/15/2019   HGB 15.5 (H) 03/15/2019   HCT 45.0  03/15/2019   PLT 269.0 03/15/2019   GLUCOSE 108 (H) 03/15/2019   CHOL 160 03/11/2018   TRIG 88.0 03/11/2018   HDL 47.80 03/11/2018   LDLCALC 95 03/11/2018   ALT 20 03/15/2019   AST 17 03/15/2019   NA 140 03/15/2019   K 4.1 03/15/2019   CL 105 03/15/2019   CREATININE 1.16 03/15/2019   BUN 9 03/15/2019   CO2 25 03/15/2019   TSH 1.97 03/15/2019   HGBA1C 6.2 (A) 05/05/2019   MICROALBUR <0.7 03/11/2018      Assessment & Plan:

## 2019-07-18 ENCOUNTER — Telehealth: Payer: Self-pay | Admitting: *Deleted

## 2019-07-18 NOTE — Telephone Encounter (Signed)
Pt left msg stating that she had lab work done last week and her B12 was low. Pt wanted to know if she needs to be taking vitamin D. If so, how much? Pt also stated she has been having muscle cramps at night and does Dr. Tamala Julian have any recommendations to help with this?

## 2019-07-19 NOTE — Telephone Encounter (Signed)
Vitamin D 2000 Iu dialy  For cramps Iron 65mg  with 500mg  of vitamin C daily or most days of the week

## 2019-07-20 NOTE — Telephone Encounter (Signed)
Left message to call back  

## 2019-07-21 NOTE — Telephone Encounter (Signed)
Talked to patient.

## 2019-07-22 ENCOUNTER — Encounter: Payer: Self-pay | Admitting: Podiatry

## 2019-07-22 ENCOUNTER — Ambulatory Visit: Payer: BC Managed Care – PPO | Admitting: Podiatry

## 2019-07-22 ENCOUNTER — Other Ambulatory Visit: Payer: Self-pay

## 2019-07-22 ENCOUNTER — Ambulatory Visit (INDEPENDENT_AMBULATORY_CARE_PROVIDER_SITE_OTHER): Payer: BC Managed Care – PPO

## 2019-07-22 DIAGNOSIS — M722 Plantar fascial fibromatosis: Secondary | ICD-10-CM

## 2019-07-22 DIAGNOSIS — M7752 Other enthesopathy of left foot: Secondary | ICD-10-CM | POA: Diagnosis not present

## 2019-07-22 DIAGNOSIS — M779 Enthesopathy, unspecified: Secondary | ICD-10-CM

## 2019-07-22 NOTE — Progress Notes (Signed)
Subjective:   Patient ID: Claire Irwin, female   DOB: 48 y.o.   MRN: [id number]   HPI Patient states that had a reoccurrence of heel pain left and I am not sure if I did something else to it bone loss   ROS      Objective:  Physical Exam  Neurovascular status intact with patient's left plantar heel found to be acutely tender with inflammation noted     Assessment:  Plantar fasciitis left with inflammation noted     Plan:  Reviewed condition and did sterile prep and did careful injection of the fascia 3 mg Kenalog 5 mg Xylocaine reviewed x-rays and advised on reduced activity.  Orthotics are mildly worn and if symptoms persist may need new orthotics  X-rays indicate that there is small spur no indication stress fracture arthritis

## 2019-08-01 ENCOUNTER — Other Ambulatory Visit: Payer: Self-pay | Admitting: Endocrinology

## 2019-08-08 ENCOUNTER — Encounter: Payer: Self-pay | Admitting: Internal Medicine

## 2019-08-08 ENCOUNTER — Ambulatory Visit (INDEPENDENT_AMBULATORY_CARE_PROVIDER_SITE_OTHER): Payer: BC Managed Care – PPO | Admitting: Internal Medicine

## 2019-08-08 DIAGNOSIS — K5909 Other constipation: Secondary | ICD-10-CM | POA: Diagnosis not present

## 2019-08-08 DIAGNOSIS — E119 Type 2 diabetes mellitus without complications: Secondary | ICD-10-CM | POA: Diagnosis not present

## 2019-08-08 DIAGNOSIS — H0011 Chalazion right upper eyelid: Secondary | ICD-10-CM | POA: Insufficient documentation

## 2019-08-08 MED ORDER — LINACLOTIDE 290 MCG PO CAPS: 290.0000 ug | ORAL_CAPSULE | Freq: Every day | ORAL | 11 refills | Status: DC

## 2019-08-08 NOTE — Assessment & Plan Note (Signed)
stable overall by history and exam, recent data reviewed with pt, and pt to continue medical treatment as before,  to f/u any worsening symptoms or concerns  

## 2019-08-08 NOTE — Patient Instructions (Signed)
Ok to increase the linzess to 290 mg daily as needed  You will be contacted regarding the referral for: GI and eye doctor  Please continue all other medications as before, and refills have been done if requested.  Please have the pharmacy call with any other refills you may need.  Please continue your efforts at being more active, low cholesterol diet, and weight control  Please keep your appointments with your specialists as you may have planned

## 2019-08-08 NOTE — Assessment & Plan Note (Signed)
For increased linzess 290 qd prn, also refer GI, due to screening colonoscopy

## 2019-08-08 NOTE — Progress Notes (Signed)
Patient ID: Claire Irwin, female   DOB: February 18, 1971, 48 y.o.   MRN: [id number]  Virtual Visit via Video Note  I connected with Claire Irwin on 08/08/19 at  9:40 AM EST by a video enabled telemedicine application and verified that I am speaking with the correct person using two identifiers.  Location: Patient: at home Provider: at office   I discussed the limitations of evaluation and management by telemedicine and the availability of in person appointments. The patient expressed understanding and agreed to proceed.  History of Present Illness: Here with 3 days onset mild redness and swelling with little pain to the mid right upper eyelid staring with a stye that did not resolved. No fever , no vision changes.  Pt denies chest pain, increased sob or doe, wheezing, orthopnea, PND, increased LE swelling, palpitations, dizziness or syncope.  Pt denies new neurological symptoms such as new headache, or facial or extremity weakness or numbness   Pt denies polydipsia, polyuria.  Also Denies worsening reflux, abd pain, dysphagia, n/v, or blood but has had worsening constipation despite several OTC preps Past Medical History:  Diagnosis Date  . Allergy   . Asthma   . Diabetes mellitus without complication ([place])   . 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 Fibrocystic breast disease   . GERD (gastroesophageal reflux disease)   . History of endometriosis   . Hyperlipidemia 06/29/2011  . Preeclampsia    Past Surgical History:  Procedure Laterality Date  . ABDOMINAL ADHESION SURGERY  07/2004  . CESAREAN SECTION  02/2004  . DILATION AND CURETTAGE, DIAGNOSTIC / THERAPEUTIC      reports that she has never smoked. She has never used smokeless tobacco. She reports that she does not drink alcohol or use drugs. family history includes Allergies in her maternal grandmother and mother; Asthma in her maternal grandmother and mother; Cancer in her paternal grandmother; Diabetes in her  father and mother; Heart disease in her paternal uncle; Hypertension in her mother. Allergies  Allergen Reactions  . Hydrocodone   . Sulfonamide Derivatives    Current Outpatient Medications on File Prior to Visit  Medication Sig Dispense Refill  . albuterol (PROAIR HFA) 108 (90 Base) MCG/ACT inhaler USE 2 PUFFS 4 TIMES DAILY AS NEEDED 8.5 each 2  . aspirin EC 81 MG tablet Take 1 tablet (81 mg total) by mouth daily. 90 tablet 11  . clonazePAM (KLONOPIN) 0.5 MG tablet Take 1 tablet (0.5 mg total) by mouth 2 (two) times daily as needed for anxiety. 60 tablet 1  . cyclobenzaprine (FLEXERIL) 5 MG tablet TAKE 1 TABLET (5 MG TOTAL) BY MOUTH 3 (THREE) TIMES DAILY AS NEEDED FOR MUSCLE SPASMS. 60 tablet 0  . Diclofenac Sodium (PENNSAID) 2 % SOLN Place 2 g onto the skin 2 (two) times daily. 112 g 3  . empagliflozin (JARDIANCE) 10 MG TABS tablet Take 10 mg by mouth daily. 30 tablet 11  . famotidine (PEPCID) 20 MG tablet Take 1 tablet (20 mg total) by mouth 2 (two) times daily. 180 tablet 3  . glucose blood (ONE TOUCH ULTRA TEST) test strip Use to monitor glucose levels once per day 50 each 12  . JANUMET XR 50-1000 MG TB24 TAKE 2 TABLETS BY MOUTH EVERY DAY 60 tablet 11  . lactulose (CHRONULAC) 10 GM/15ML solution Take 45 mLs (30 g total) by mouth daily as needed for mild constipation. 236 mL 11  . Multiple Vitamins-Calcium (ONE-A-DAY WOMENS PO) Take by mouth.    Marland Kitchen  NONFORMULARY OR COMPOUNDED ITEM [facility]:  Wart Cream - Cimetidine 2%, Deoxy-D-Glucose 0.2%, Fluorouracil-5 5%, Salicylic Acid 0000000, apply to affected areas daily. 120 each 2  . norethindrone (AYGESTIN) 5 MG tablet Take 5 mg by mouth daily.  10  . NORETHINDRONE ACETATE PO Take by mouth.      Glory Rosebush DELICA LANCETS 99991111 MISC Use to monitor glucose levels qd 100 each 1  . pantoprazole (PROTONIX) 40 MG tablet Take 1 tablet (40 mg total) by mouth 2 (two) times daily before a meal. 180 tablet 3  . polyethylene glycol powder  (GLYCOLAX/MIRALAX) 17 GM/SCOOP powder Take 17 g by mouth 2 (two) times daily as needed. 3350 g 1  . traZODone (DESYREL) 50 MG tablet TAKE 1/2-1 TABLET AT BEDTIME 90 tablet 1  . triamcinolone cream (KENALOG) 0.1 % APPLY 1 APPLICATION TOPICALLY 3 (THREE) TIMES DAILY AS NEEDED. FOR RASH 45 g 0  . Vitamin D, Ergocalciferol, (DRISDOL) 1.25 MG (50000 UT) CAPS capsule TAKE 1 CAPSULE (50,000 UNITS TOTAL) BY MOUTH EVERY 7 (SEVEN) DAYS. 12 capsule 0  . WIXELA INHUB 250-50 MCG/DOSE AEPB USE 1 PUFF TWO TIMES DAILY AS NEEDED 180 each 3   No current facility-administered medications on file prior to visit.     Observations/Objective: Alert, NAD, appropriate mood and affect, resps normal, cn 2-12 intact, moves all 4s, no visible rash or swelling except for right upper mid eyelid chalazion Lab Results  Component Value Date   WBC 6.8 03/15/2019   HGB 15.5 (H) 03/15/2019   HCT 45.0 03/15/2019   PLT 269.0 03/15/2019   GLUCOSE 108 (H) 03/15/2019   CHOL 160 03/11/2018   TRIG 88.0 03/11/2018   HDL 47.80 03/11/2018   LDLCALC 95 03/11/2018   ALT 20 03/15/2019   AST 17 03/15/2019   NA 140 03/15/2019   K 4.1 03/15/2019   CL 105 03/15/2019   CREATININE 1.16 03/15/2019   BUN 9 03/15/2019   CO2 25 03/15/2019   TSH 1.97 03/15/2019   HGBA1C 6.2 (A) 05/05/2019   MICROALBUR <0.7 03/11/2018   Assessment and Plan: See notes  Follow Up Instructions: See notes   I discussed the assessment and treatment plan with the patient. The patient was provided an opportunity to ask questions and all were answered. The patient agreed with the plan and demonstrated an understanding of the instructions.   The patient was advised to call back or seek an in-person evaluation if the symptoms worsen or if the condition fails to improve as anticipated.  Cathlean Cower, MD

## 2019-08-08 NOTE — Assessment & Plan Note (Signed)
Ok for optho referral,  to f/u any worsening symptoms or concerns

## 2019-08-14 ENCOUNTER — Encounter: Payer: Self-pay | Admitting: Gastroenterology

## 2019-08-22 ENCOUNTER — Other Ambulatory Visit: Payer: Self-pay | Admitting: Internal Medicine

## 2019-08-24 ENCOUNTER — Ambulatory Visit: Payer: BC Managed Care – PPO | Admitting: Gastroenterology

## 2019-08-24 ENCOUNTER — Encounter: Payer: Self-pay | Admitting: Gastroenterology

## 2019-08-24 VITALS — BP 106/74 | HR 85 | Temp 97.4°F | Ht 65.0 in | Wt 190.4 lb

## 2019-08-24 DIAGNOSIS — K219 Gastro-esophageal reflux disease without esophagitis: Secondary | ICD-10-CM | POA: Diagnosis not present

## 2019-08-24 DIAGNOSIS — K5900 Constipation, unspecified: Secondary | ICD-10-CM | POA: Diagnosis not present

## 2019-08-24 DIAGNOSIS — Z1159 Encounter for screening for other viral diseases: Secondary | ICD-10-CM

## 2019-08-24 MED ORDER — NA SULFATE-K SULFATE-MG SULF 17.5-3.13-1.6 GM/177ML PO SOLN: 1.0000 | ORAL | 0 refills | Status: DC

## 2019-08-24 NOTE — Progress Notes (Addendum)
Referring Provider: Biagio Borg, MD Primary Care Physician:  Biagio Borg, MD  Reason for Consultation:  Constipation   IMPRESSION:  Chronic constipation without alarm features    - not responding to Linzess Persistent reflux despite multiple therapeutic attempts No prior colon cancer screening  No known family history of colon cancer or polyps  Constipation not responding to laxative therapy without alarm features. Likely defecatory disorder, normal transit constipation, or slow transit constipation. Differential for constipation also includes disordered colonic and/or pelvic floor/anorectal function, colonic disease (stricture, cancer, anal fissure, proctitis), and diabetes.  Recommend further evaluation with a Sitzmarks study.  Continue Linzess 290 mcg daily.  Add a daily stool bulking agent such as Metamucil.  Discussed diagnosis of reflux as well as lifestyle modifications.  I recommended that she take pantoprazole 40 mg every day instead of using reflux treatments on a as needed basis.  Reviewed lifestyle modifications.  We will plan close follow-up to see if her symptoms are improving.  She is 48 and has had no prior colon cancer screening.  Colonoscopy recommended.     PLAN: Pantoprazole 40 mg QAM Reviewed reflux lifestyle modifications Add daily stool bulking agent such as Metamucil, Benefiber Continue Linzess 290 mcg daily Sitz Mark study Colonoscopy for colon cancer screening  Please see the "Patient Instructions" section for addition details about the plan.  HPI: Claire Irwin is a 48 y.o. female referred by Dr. Jenny Reichmann for further evaluation of constipation.  The history is obtained to the patient and review of her electronic health record.  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  She has diabetes, asthma, endometriosis, and a history of a partial hysterectomy 10 years ago.  Diagnosed with diabetes 4 years ago. Stool change  followed the diagnosis.   Bowel movement 3-4 times a week.  Associated bloating.  Some straining although she tries to avoid.  Ocassionally lumpy hard stool. Sensation of incomplete evacuation. No use of digital maneuvers. No sensation of anorectal obstruction or blockage with 25 percent of bowel movements.  No blood or mucus.  No abdominal pain.   Worsening constipation despite several OTC preps including stool softeners. Finds it more difficult to manage.  No other identified exacerbating or relieving features.  Linzess increased to 290 daily with improvement in constipation. But, trade off is severe diarrhea.  Now with gurgling that frequently keeps her awake at night.  She takes the Linzess on weekends to minimize symptoms.  Lifetime history of reflux. Remembers seeing a specialist as a child and drinking barium.  Taking omeprazole, Pepcid, and then protonix as she finds that she has to keep switching them to control her symptoms.  Takes medication most days but not consistently.  Stangles easily.  No evidence for GI bleeding, iron deficiency anemia, anorexia, unexplained weight loss, dysphagia, odynophagia, persistent vomiting, or gastrointestinal cancer in a first-degree relative.  TSH normal 1.97 and calcium normal 9.7 on 03/15/19  No prior colon cancer screening.  No recent abdominal imaging.  Mother with reflux. No known family history of colon cancer or polyps. No family history of uterine/endometrial cancer, pancreatic cancer or gastric/stomach cancer.   Past Medical History:  Diagnosis Date  . Allergy   . Asthma   . Diabetes mellitus without complication ([place])   . 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 Fibrocystic breast disease   . GERD (gastroesophageal reflux disease)   . History of endometriosis   . Hyperlipidemia 06/29/2011  .  Preeclampsia     Past Surgical History:  Procedure Laterality Date  . ABDOMINAL ADHESION SURGERY  07/2004  . CESAREAN SECTION   02/2004  . DILATION AND CURETTAGE, DIAGNOSTIC / THERAPEUTIC      Current Outpatient Medications  Medication Sig Dispense Refill  . albuterol (VENTOLIN HFA) 108 (90 Base) MCG/ACT inhaler USE 2 PUFFS 4 TIMES DAILY AS NEEDED 8.5 g 2  . aspirin EC 81 MG tablet Take 1 tablet (81 mg total) by mouth daily. 90 tablet 11  . clonazePAM (KLONOPIN) 0.5 MG tablet Take 1 tablet (0.5 mg total) by mouth 2 (two) times daily as needed for anxiety. 60 tablet 1  . cyclobenzaprine (FLEXERIL) 5 MG tablet TAKE 1 TABLET (5 MG TOTAL) BY MOUTH 3 (THREE) TIMES DAILY AS NEEDED FOR MUSCLE SPASMS. 60 tablet 0  . Diclofenac Sodium (PENNSAID) 2 % SOLN Place 2 g onto the skin 2 (two) times daily. 112 g 3  . empagliflozin (JARDIANCE) 10 MG TABS tablet Take 10 mg by mouth daily. 30 tablet 11  . famotidine (PEPCID) 20 MG tablet Take 1 tablet (20 mg total) by mouth 2 (two) times daily. 180 tablet 3  . glucose blood (ONE TOUCH ULTRA TEST) test strip Use to monitor glucose levels once per day 50 each 12  . JANUMET XR 50-1000 MG TB24 TAKE 2 TABLETS BY MOUTH EVERY DAY 60 tablet 11  . lactulose (CHRONULAC) 10 GM/15ML solution Take 45 mLs (30 g total) by mouth daily as needed for mild constipation. 236 mL 11  . linaclotide (LINZESS) 290 MCG CAPS capsule Take 1 capsule (290 mcg total) by mouth daily before breakfast. 30 capsule 11  . Multiple Vitamins-Calcium (ONE-A-DAY WOMENS PO) Take by mouth.    . NONFORMULARY OR COMPOUNDED ITEM [facility]:  Wart Cream - Cimetidine 2%, Deoxy-D-Glucose 0.2%, Fluorouracil-5 5%, Salicylic Acid 0000000, apply to affected areas daily. 120 each 2  . norethindrone (AYGESTIN) 5 MG tablet Take 5 mg by mouth daily.  10  . NORETHINDRONE ACETATE PO Take by mouth.      Glory Rosebush DELICA LANCETS 99991111 MISC Use to monitor glucose levels qd 100 each 1  . pantoprazole (PROTONIX) 40 MG tablet Take 1 tablet (40 mg total) by mouth 2 (two) times daily before a meal. 180 tablet 3  . polyethylene glycol powder  (GLYCOLAX/MIRALAX) 17 GM/SCOOP powder Take 17 g by mouth 2 (two) times daily as needed. 3350 g 1  . traZODone (DESYREL) 50 MG tablet TAKE 1/2-1 TABLET AT BEDTIME 90 tablet 1  . triamcinolone cream (KENALOG) 0.1 % APPLY 1 APPLICATION TOPICALLY 3 (THREE) TIMES DAILY AS NEEDED. FOR RASH 45 g 0  . Vitamin D, Ergocalciferol, (DRISDOL) 1.25 MG (50000 UT) CAPS capsule TAKE 1 CAPSULE (50,000 UNITS TOTAL) BY MOUTH EVERY 7 (SEVEN) DAYS. 12 capsule 0  . WIXELA INHUB 250-50 MCG/DOSE AEPB USE 1 PUFF TWO TIMES DAILY AS NEEDED 180 each 3   No current facility-administered medications for this visit.    Allergies as of 08/24/2019 - Review Complete 08/08/2019  Allergen Reaction Noted  . Hydrocodone  02/21/2010  . Sulfonamide derivatives      Family History  Problem Relation Age of Onset  . Diabetes Mother   . Hypertension Mother   . Asthma Mother   . Allergies Mother   . Diabetes Father   . Heart disease Paternal Uncle   . Asthma Maternal Grandmother   . Allergies Maternal Grandmother   . Cancer Paternal Grandmother  Brain and Lung    Social History   Socioeconomic History  . Marital status: Legally Separated    Spouse name: Not on file  . Number of children: Not on file  . Years of education: Not on file  . Highest education level: Not on file  Occupational History  . Not on file  Tobacco Use  . Smoking status: Never Smoker  . Smokeless tobacco: Never Used  Substance and Sexual Activity  . Alcohol use: No  . Drug use: No  . Sexual activity: Not on file  Other Topics Concern  . Not on file  Social History Narrative  . Not on file   Social Determinants of Health   Financial Resource Strain:   . Difficulty of Paying Living Expenses: Not on file  Food Insecurity:   . Worried About Charity fundraiser in the Last Year: Not on file  . Ran Out of Food in the Last Year: Not on file  Transportation Needs:   . Lack of Transportation (Medical): Not on file  . Lack of  Transportation (Non-Medical): Not on file  Physical Activity:   . Days of Exercise per Week: Not on file  . Minutes of Exercise per Session: Not on file  Stress:   . Feeling of Stress : Not on file  Social Connections:   . Frequency of Communication with Friends and Family: Not on file  . Frequency of Social Gatherings with Friends and Family: Not on file  . Attends Religious Services: Not on file  . Active Member of Clubs or Organizations: Not on file  . Attends Archivist Meetings: Not on file  . Marital Status: Not on file  Intimate Partner Violence:   . Fear of Current or Ex-Partner: Not on file  . Emotionally Abused: Not on file  . Physically Abused: Not on file  . Sexually Abused: Not on file    Review of Systems: 12 system ROS is negative except as noted above with the addition of seasonal allergies and leg pains mostly on the left.   Physical Exam: General:   Alert,  well-nourished, pleasant and cooperative in NAD Head:  Normocephalic and atraumatic. Eyes:  Sclera clear, no icterus.   Conjunctiva pink. Ears:  Normal auditory acuity. Nose:  No deformity, discharge,  or lesions. Mouth:  No deformity or lesions.   Neck:  Supple; no masses or thyromegaly. Lungs:  Clear throughout to auscultation.   No wheezes. Heart:  Regular rate and rhythm; no murmurs. Abdomen:  Soft,nontender, nondistended, normal bowel sounds, no rebound or guarding. No hepatosplenomegaly.   Rectal:  Deferred  Msk:  Symmetrical. No boney deformities LAD: No inguinal or umbilical LAD Extremities:  No clubbing or edema. Neurologic:  Alert and  oriented x4;  grossly nonfocal Skin:  Intact without significant lesions or rashes. Psych:  Alert and cooperative. Normal mood and affect.   Arhaan Chesnut L. Tarri Glenn, MD, MPH 08/24/2019, 9:33 AM

## 2019-08-24 NOTE — Patient Instructions (Addendum)
Please use a daily stool bulking agent such as Metamucil twice daily.  Continue your Linzess.  I am recommending an x-ray test to further evaluate your constipation. Come back on Tuesday 08-29-2019 for the xray in the basement. You do not need an appointment.   A recent study showed that eating two kiwi every day is as good as medical therapy for treating constipation. You may find that some foods improve your constipation.   It is time for a colonoscopy. Please schedule at your convenience.   I recommend that you take pantoprazole 40 mg every morning. I gave you a brochure today on ways to minimize reflux.   Toileting tips to help with your constipation  - Establish a time to try to move your bowels every day. For many people, this is after a cup of coffee.  - Sit all of the way back on the toilet keeping your back fairly straight and lean forward bending from your hips, resting your forearms on your thighs. Raising your feet with a step stool can be helpful.  - Relax the rectum feeling it bulge toward the toilet water. If you feel your rectum raising toward your body, you are contracting rather than relaxing.  - Breathe in and slowly exhale. "Belly breath" by expanding your belly towards your belly button. Keep belly expanded as you gently direct pressure down and back to the anus. A low pitched GRRR sound can assist with increasing intra-abdominal pressure.   - Repeat 3-4 times. If unsuccessful, contract the pelvic floor to restore normal tone and get off the toilet. Avoid excessive straining.  - To reduce excessive wiping by teaching your anus to normally contract, place hands on outer aspect of knees and resist knee movement outward. Hold 5-10 second then place hands just inside of knees and resist inward movement of knees. Hold 5 seconds. Repeat a few times each way.  Gastroesophageal Reflux Disease, Adult Gastroesophageal reflux (GER) happens when acid from the stomach flows up into  the tube that connects the mouth and the stomach (esophagus). Normally, food travels down the esophagus and stays in the stomach to be digested. With GER, food and stomach acid sometimes move back up into the esophagus. You may have a disease called gastroesophageal reflux disease (GERD) if the reflux:  Happens often.  Causes frequent or very bad symptoms.  Causes problems such as damage to the esophagus. When this happens, the esophagus becomes sore and swollen (inflamed). Over time, GERD can make small holes (ulcers) in the lining of the esophagus. What are the causes? This condition is caused by a problem with the muscle between the esophagus and the stomach. When this muscle is weak or not normal, it does not close properly to keep food and acid from coming back up from the stomach. The muscle can be weak because of:  Tobacco use.  Pregnancy.  Having a certain type of hernia (hiatal hernia).  Alcohol use.  Certain foods and drinks, such as coffee, chocolate, onions, and peppermint. What increases the risk? You are more likely to develop this condition if you:  Are overweight.  Have a disease that affects your connective tissue.  Use NSAID medicines. What are the signs or symptoms? Symptoms of this condition include:  Heartburn.  Difficult or painful swallowing.  The feeling of having a lump in the throat.  A bitter taste in the mouth.  Bad breath.  Having a lot of saliva.  Having an upset or bloated stomach.  Belching.  Chest pain. Different conditions can cause chest pain. Make sure you see your doctor if you have chest pain.  Shortness of breath or noisy breathing (wheezing).  Ongoing (chronic) cough or a cough at night.  Wearing away of the surface of teeth (tooth enamel).  Weight loss. How is this treated? Treatment will depend on how bad your symptoms are. Your doctor may suggest:  Changes to your diet.  Medicine.  Surgery. Follow these  instructions at home: Eating and drinking   Follow a diet as told by your doctor. You may need to avoid foods and drinks such as: ? Coffee and tea (with or without caffeine). ? Drinks that contain alcohol. ? Energy drinks and sports drinks. ? Bubbly (carbonated) drinks or sodas. ? Chocolate and cocoa. ? Peppermint and mint flavorings. ? Garlic and onions. ? Horseradish. ? Spicy and acidic foods. These include peppers, chili powder, curry powder, vinegar, hot sauces, and BBQ sauce. ? Citrus fruit juices and citrus fruits, such as oranges, lemons, and limes. ? Tomato-based foods. These include red sauce, chili, salsa, and pizza with red sauce. ? Fried and fatty foods. These include donuts, french fries, potato chips, and high-fat dressings. ? High-fat meats. These include hot dogs, rib eye steak, sausage, ham, and bacon. ? High-fat dairy items, such as whole milk, butter, and cream cheese.  Eat small meals often. Avoid eating large meals.  Avoid drinking large amounts of liquid with your meals.  Avoid eating meals during the 2-3 hours before bedtime.  Avoid lying down right after you eat.  Do not exercise right after you eat. Lifestyle   Do not use any products that contain nicotine or tobacco. These include cigarettes, e-cigarettes, and chewing tobacco. If you need help quitting, ask your doctor.  Try to lower your stress. If you need help doing this, ask your doctor.  If you are overweight, lose an amount of weight that is healthy for you. Ask your doctor about a safe weight loss goal. General instructions  Pay attention to any changes in your symptoms.  Take over-the-counter and prescription medicines only as told by your doctor. Do not take aspirin, ibuprofen, or other NSAIDs unless your doctor says it is okay.  Wear loose clothes. Do not wear anything tight around your waist.  Raise (elevate) the head of your bed about 6 inches (15 cm).  Avoid bending over if this  makes your symptoms worse.  Keep all follow-up visits as told by your doctor. This is important. Contact a doctor if:  You have new symptoms.  You lose weight and you do not know why.  You have trouble swallowing or it hurts to swallow.  You have wheezing or a cough that keeps happening.  Your symptoms do not get better with treatment.  You have a hoarse voice. Get help right away if:  You have pain in your arms, neck, jaw, teeth, or back.  You feel sweaty, dizzy, or light-headed.  You have chest pain or shortness of breath.  You throw up (vomit) and your throw-up looks like blood or coffee grounds.  You pass out (faint).  Your poop (stool) is bloody or black.  You cannot swallow, drink, or eat. Summary  If a person has gastroesophageal reflux disease (GERD), food and stomach acid move back up into the esophagus and cause symptoms or problems such as damage to the esophagus.  Treatment will depend on how bad your symptoms are.  Follow a diet as told by your doctor.  Take all medicines only as told by your doctor. This information is not intended to replace advice given to you by your health care provider. Make sure you discuss any questions you have with your health care provider. Document Released: 02/03/2008 Document Revised: 02/23/2018 Document Reviewed: 02/23/2018 Elsevier Patient Education  2020 [employer].

## 2019-08-29 ENCOUNTER — Ambulatory Visit (INDEPENDENT_AMBULATORY_CARE_PROVIDER_SITE_OTHER)
Admission: RE | Admit: 2019-08-29 | Discharge: 2019-08-29 | Disposition: A | Discharge status: Home / Self Care | Payer: BC Managed Care – PPO | Source: Ambulatory Visit | Attending: Gastroenterology | Admitting: Gastroenterology

## 2019-08-29 ENCOUNTER — Telehealth: Payer: Self-pay | Admitting: Gastroenterology

## 2019-08-29 ENCOUNTER — Other Ambulatory Visit: Payer: Self-pay

## 2019-08-29 ENCOUNTER — Other Ambulatory Visit: Payer: Self-pay | Admitting: *Deleted

## 2019-08-29 DIAGNOSIS — K5900 Constipation, unspecified: Secondary | ICD-10-CM

## 2019-08-29 NOTE — Telephone Encounter (Signed)
Thank you for the update. I recommended a colonoscopy for colon cancer screening at the time of her consultation.

## 2019-08-29 NOTE — Telephone Encounter (Signed)
Pt called to inform Dr. Tarri Glenn that her mother had hx of polyps. Pt stated that Dr. Tarri Glenn wanted to know this information. Pt also stated that she called her insurance company to inquire about coverage. She was told that colonoscopy is only covered if it is coded as preventive since pt is 48 yrs old. Pt stated that Dr. Tarri Glenn told her that it could be considered preventive depending on her family hx.

## 2019-08-30 NOTE — Addendum Note (Signed)
Addended by: Wyline Beady on: 08/30/2019 04:00 PM   Modules accepted: Orders

## 2019-09-04 ENCOUNTER — Other Ambulatory Visit: Payer: Self-pay

## 2019-09-04 ENCOUNTER — Ambulatory Visit: Payer: BC Managed Care – PPO | Admitting: Endocrinology

## 2019-09-04 VITALS — BP 114/68 | HR 107 | Temp 98.5°F | Wt 195.2 lb

## 2019-09-04 DIAGNOSIS — E119 Type 2 diabetes mellitus without complications: Secondary | ICD-10-CM

## 2019-09-04 DIAGNOSIS — R000 Tachycardia, unspecified: Secondary | ICD-10-CM | POA: Diagnosis not present

## 2019-09-04 DIAGNOSIS — R110 Nausea: Secondary | ICD-10-CM | POA: Diagnosis not present

## 2019-09-04 LAB — POCT GLYCOSYLATED HEMOGLOBIN (HGB A1C): Hemoglobin A1C: 6.8 % — AB (ref 4.0–5.6)

## 2019-09-04 MED ORDER — JANUMET XR 50-1000 MG PO TB24: 1.0000 | ORAL_TABLET | Freq: Every day | ORAL | 3 refills | Status: DC

## 2019-09-04 MED ORDER — JARDIANCE 10 MG PO TABS: 5.0000 mg | ORAL_TABLET | Freq: Every day | ORAL | 3 refills | Status: DC

## 2019-09-04 NOTE — Progress Notes (Signed)
Subjective:    Patient ID: Claire Irwin, female    DOB: 1971-04-13, 49 y.o.   MRN: [id number]  HPI Pt returns for f/u of diabetes mellitus: DM type: 2 Dx'ed: 2878 Complications: renal insuff.  Therapy: 3 oral meds GDM: never DKA: never Severe hypoglycemia: never.  Pancreatitis: never.  Other: she has never taken insulin; she did not tolerate farxiga (rash).   Interval history: pt states she feels well in general.  She takes meds as rx'ed, and tolerates well.  She says cbg's are in the 100's.  She takes Janumet just 1/day, due to nausea.  She takes jardiance just 1/week.   Past Medical History:  Diagnosis Date  . Allergy   . Asthma   . Diabetes mellitus without complication ([place])   . 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 Endometriosis   . Fibrocystic breast disease   . GERD (gastroesophageal reflux disease)   . History of endometriosis   . Hyperlipidemia 06/29/2011  . Preeclampsia     Past Surgical History:  Procedure Laterality Date  . ABDOMINAL ADHESION SURGERY  07/2004  . CESAREAN SECTION  02/2004  . DILATION AND CURETTAGE, DIAGNOSTIC / THERAPEUTIC    . PARTIAL HYSTERECTOMY     still have ovaries    Social History   Socioeconomic History  . Marital status: Legally Separated    Spouse name: Not on file  . Number of children: 2  . Years of education: Not on file  . Highest education level: Not on file  Occupational History  . Occupation: Pharmacist, hospital  Tobacco Use  . Smoking status: Never Smoker  . Smokeless tobacco: Never Used  Substance and Sexual Activity  . Alcohol use: No  . Drug use: No  . Sexual activity: Not on file  Other Topics Concern  . Not on file  Social History Narrative  . Not on file   Social Determinants of Health   Financial Resource Strain:   . Difficulty of Paying Living Expenses: Not on file  Food Insecurity:   . Worried About Charity fundraiser in the Last Year: Not on file  . Ran Out of Food in the Last Year:  Not on file  Transportation Needs:   . Lack of Transportation (Medical): Not on file  . Lack of Transportation (Non-Medical): Not on file  Physical Activity:   . Days of Exercise per Week: Not on file  . Minutes of Exercise per Session: Not on file  Stress:   . Feeling of Stress : Not on file  Social Connections:   . Frequency of Communication with Friends and Family: Not on file  . Frequency of Social Gatherings with Friends and Family: Not on file  . Attends Religious Services: Not on file  . Active Member of Clubs or Organizations: Not on file  . Attends Archivist Meetings: Not on file  . Marital Status: Not on file  Intimate Partner Violence:   . Fear of Current or Ex-Partner: Not on file  . Emotionally Abused: Not on file  . Physically Abused: Not on file  . Sexually Abused: Not on file    Current Outpatient Medications on File Prior to Visit  Medication Sig Dispense Refill  . linaclotide (LINZESS) 290 MCG CAPS capsule Take 1 capsule (290 mcg total) by mouth daily before breakfast. (Patient taking differently: Take 290 mcg by mouth as needed (Mainly takes on weekend). ) 30 capsule 11  . albuterol (VENTOLIN HFA)  108 (90 Base) MCG/ACT inhaler USE 2 PUFFS 4 TIMES DAILY AS NEEDED 8.5 g 2  . Ascorbic Acid (VITAMIN C) 500 MG CAPS Take 1 capsule by mouth daily as needed.    Marland Kitchen aspirin EC 81 MG tablet Take 1 tablet (81 mg total) by mouth daily. 90 tablet 11  . Calcium Carbonate Antacid (TUMS PO) Take 1 tablet by mouth as needed.    . Cholecalciferol (VITAMIN D-3) 125 MCG (5000 UT) TABS Take 1 tablet by mouth daily as needed.    . Diclofenac Sodium (PENNSAID) 2 % SOLN Apply topically as needed.    . famotidine (PEPCID) 20 MG tablet Take 1 tablet (20 mg total) by mouth 2 (two) times daily. (Patient taking differently: Take 20 mg by mouth as needed. ) 180 tablet 3  . ferrous sulfate 325 (65 FE) MG tablet Take 325 mg by mouth daily as needed.    Marland Kitchen glucose blood (ONE TOUCH ULTRA  TEST) test strip Use to monitor glucose levels once per day 50 each 12  . linaclotide (LINZESS) 72 MCG capsule Take 72 mcg by mouth as needed. Usually during the weekday    . loratadine (CLARITIN) 10 MG tablet Take 10 mg by mouth daily as needed for allergies.    . Multiple Vitamins-Calcium (ONE-A-DAY WOMENS PO) Take by mouth.    . Na Sulfate-K Sulfate-Mg Sulf 17.5-3.13-1.6 GM/177ML SOLN Take 1 kit by mouth as directed. 354 mL 0  . norethindrone (AYGESTIN) 5 MG tablet Take 5 mg by mouth daily.  10  . omeprazole (PRILOSEC) 20 MG capsule Take 20 mg by mouth as needed.    Glory Rosebush DELICA LANCETS 29H MISC Use to monitor glucose levels qd 100 each 1  . vitamin B-12 (CYANOCOBALAMIN) 1000 MCG tablet Take 1,000 mcg by mouth daily as needed.    Grant Ruts INHUB 250-50 MCG/DOSE AEPB USE 1 PUFF TWO TIMES DAILY AS NEEDED 180 each 3   No current facility-administered medications on file prior to visit.    Allergies  Allergen Reactions  . Hydrocodone   . Sulfonamide Derivatives     Family History  Problem Relation Age of Onset  . Diabetes Mother   . Hypertension Mother   . Asthma Mother   . Allergies Mother   . Breast cancer Mother   . Diabetes Father   . Heart disease Paternal Uncle   . Asthma Maternal Grandmother   . Allergies Maternal Grandmother   . Breast cancer Maternal Grandmother   . Cancer Paternal Grandmother        Brain and Lung  . Breast cancer Maternal Aunt   . Colon cancer Neg Hx   . Esophageal cancer Neg Hx     BP 114/68 (BP Location: Right Arm, Patient Position: Sitting, Cuff Size: Large)   Pulse (!) 107   Temp 98.5 F (36.9 C)   Wt 195 lb 3.2 oz (88.5 kg)   SpO2 99%   BMI 32.48 kg/m    Review of Systems She denies hypoglycemia    Objective:   Physical Exam VITAL SIGNS:  See vs page GENERAL: no distress Pulses: dorsalis pedis intact bilat.   MSK: no deformity of the feet CV: trace bilat leg edema Skin:  no ulcer on the feet.  normal color and temp on the  feet. Neuro: sensation is intact to touch on the feet   Lab Results  Component Value Date   HGBA1C 6.8 (A) 09/04/2019   Lab Results  Component Value Date  TSH 1.97 03/15/2019      Assessment & Plan:  Type 2 DM: worse Tachycardia: not thyroid-related.  We'll follow.  Nausea: this limits Januvia dosage.   Patient Instructions  Please take Jardiance 1/2 pill per day, and Janumet, 1 per day. check your blood sugar once a day.  vary the time of day when you check, between before the 3 meals, and at bedtime.  also check if you have symptoms of your blood sugar being too high or too low.  please keep a record of the readings and bring it to your next appointment here (or you can bring the meter itself).  You can write it on any piece of paper.  please call us sooner if your blood sugar goes below 70, or if you have a lot of readings over 200. Please come back for a follow-up appointment in 3-4 months.

## 2019-09-04 NOTE — Patient Instructions (Addendum)
Please take Jardiance 1/2 pill per day, and Janumet, 1 per day. check your blood sugar once a day.  vary the time of day when you check, between before the 3 meals, and at bedtime.  also check if you have symptoms of your blood sugar being too high or too low.  please keep a record of the readings and bring it to your next appointment here (or you can bring the meter itself).  You can write it on any piece of paper.  please call us sooner if your blood sugar goes below 70, or if you have a lot of readings over 200. Please come back for a follow-up appointment in 3-4 months.

## 2019-09-17 ENCOUNTER — Other Ambulatory Visit: Payer: Self-pay

## 2019-09-17 ENCOUNTER — Encounter: Payer: Self-pay | Admitting: Emergency Medicine

## 2019-09-17 ENCOUNTER — Ambulatory Visit
Admission: EM | Admit: 2019-09-17 | Discharge: 2019-09-17 | Disposition: A | Discharge status: Home / Self Care | Payer: BC Managed Care – PPO | Attending: Emergency Medicine | Admitting: Emergency Medicine

## 2019-09-17 DIAGNOSIS — N809 Endometriosis, unspecified: Secondary | ICD-10-CM

## 2019-09-17 DIAGNOSIS — R109 Unspecified abdominal pain: Secondary | ICD-10-CM | POA: Diagnosis not present

## 2019-09-17 MED ORDER — KETOROLAC TROMETHAMINE 60 MG/2ML IM SOLN
60.0000 mg | Freq: Once | INTRAMUSCULAR | Status: AC
  Administered 2019-09-17: 60 mg via INTRAMUSCULAR

## 2019-09-17 MED ORDER — NAPROXEN 500 MG PO TABS: 500.0000 mg | ORAL_TABLET | Freq: Two times a day (BID) | ORAL | 0 refills | Status: DC

## 2019-09-17 NOTE — Discharge Instructions (Addendum)
Take naproxen as directed w/ food. Continue heating pads. Follow up with GYN Mon/Tues.

## 2019-09-17 NOTE — ED Provider Notes (Signed)
[name]    CSN: [id number] Arrival date & time: 09/17/19  1221      History   Chief Complaint Chief Complaint  Patient presents with  . Abdominal Pain    HPI Claire Irwin is a 49 y.o. female with history of well-controlled diabetes, endometriosis, GERD, chronic constipation, asthma, allergies presenting for lower abdominal pain/cramping.  Patient states pain feels like.  Cramps: Consistent with endometriosis pain.  Patient underwent partial hysterectomy (supracervical per patient) over 10 years ago for her endometriosis.  Patient routinely followed by both endocrinology for diabetes and GYN for endometriosis, Pap smears.  States she saw her GYN on Friday, with whom she discussed pain, vaginal bleeding-he started her on OCP which she began yesterday without adverse reaction.  Patient denies heavy vaginal bleeding, discharge, concern for STD.  Patient has been taking muscle axis, ibuprofen, heating pad without significant relief.  States she has received 500 mg Naprosyn from her GYN in the past which helps more.  Patient also requesting pain shot in clinic: States she has gotten this once before, though cannot remember name, though it helped.  Past Medical History:  Diagnosis Date  . Allergy   . Asthma   . Diabetes mellitus without complication ([place])   . 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 Endometriosis   . Fibrocystic breast disease   . GERD (gastroesophageal reflux disease)   . History of endometriosis   . Hyperlipidemia 06/29/2011  . Preeclampsia     Patient Active Problem List   Diagnosis Date Noted  . Chalazion of right upper eyelid 08/08/2019  . Neck pain 05/03/2019  . Nonallopathic lesion of sacral region 04/05/2019  . Nonallopathic lesion of lumbosacral region 04/05/2019  . Wart of face 04/03/2019  . Sinusitis 01/18/2019  . Chronic constipation 01/10/2019  . Flu-like symptoms 10/31/2018  . Nonallopathic lesion of thoracic region  10/11/2018  . Nonallopathic lesion of rib cage 10/11/2018  . Nonallopathic lesion of cervical region 10/11/2018  . Tonsil stone 06/17/2018  . 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 Pharyngitis 05/30/2018  . Chronic tonsillitis 05/30/2018  . Left shoulder pain 05/30/2018  . Fatigue 07/01/2016  . Toe pain, bilateral 02/14/2016  . Pain in limb 02/14/2016  . Diabetes ([place]) 02/14/2016  . Anxiety state 01/02/2015  . Varicose veins of lower extremities with other complications 15/40/0867  . Diastolic dysfunction 61/95/0932  . Peripheral edema 04/05/2013  . Tenosynovitis of forearm 10/30/2012  . Hyperlipidemia 06/29/2011  . Preventative health care 12/26/2010  . OSTEOARTHRITIS, KNEE, LEFT 07/14/2010  . BACK PAIN, LUMBAR 02/21/2010  . HYPERSOMNIA 07/17/2009  . Rash 02/05/2009  . BREAST MASS 06/27/2008  . ALLERGIC RHINITIS 05/31/2008  . Asthma 05/31/2008  . GERD 05/31/2008    Past Surgical History:  Procedure Laterality Date  . ABDOMINAL ADHESION SURGERY  07/2004  . CESAREAN SECTION  02/2004  . DILATION AND CURETTAGE, DIAGNOSTIC / THERAPEUTIC    . PARTIAL HYSTERECTOMY     still have ovaries    OB History   No obstetric history on file.      Home Medications    Prior to Admission medications   Medication Sig Start Date End Date Taking? Authorizing Provider  albuterol (VENTOLIN HFA) 108 (90 Base) MCG/ACT inhaler USE 2 PUFFS 4 TIMES DAILY AS NEEDED 08/22/19   Biagio Borg, MD  Ascorbic Acid (VITAMIN C) 500 MG CAPS Take 1 capsule by mouth daily  as needed.    [provider]  aspirin EC 81 MG tablet Take 1 tablet (81 mg total) by mouth daily. 07/01/16   Biagio Borg, MD  Calcium Carbonate Antacid (TUMS PO) Take 1 tablet by mouth as needed.    [provider]  Cholecalciferol (VITAMIN D-3) 125 MCG (5000 UT) TABS Take 1 tablet by mouth daily as needed.    [provider]  Diclofenac Sodium  (PENNSAID) 2 % SOLN Apply topically as needed.    [provider]  empagliflozin (JARDIANCE) 10 MG TABS tablet Take 5 mg by mouth daily. 09/04/19   Renato Shin, MD  famotidine (PEPCID) 20 MG tablet Take 1 tablet (20 mg total) by mouth 2 (two) times daily. Patient taking differently: Take 20 mg by mouth as needed.  01/10/19   Biagio Borg, MD  ferrous sulfate 325 (65 FE) MG tablet Take 325 mg by mouth daily as needed.    [provider]  glucose blood (ONE TOUCH ULTRA TEST) test strip Use to monitor glucose levels once per day 07/18/18   Renato Shin, MD  linaclotide [facility]) 290 MCG CAPS capsule Take 1 capsule (290 mcg total) by mouth daily before breakfast. Patient taking differently: Take 290 mcg by mouth as needed (Mainly takes on weekend).  08/08/19   Biagio Borg, MD  linaclotide [facility] 72 MCG capsule Take 72 mcg by mouth as needed. Usually during the weekday    [provider]  loratadine (CLARITIN) 10 MG tablet Take 10 mg by mouth daily as needed for allergies.    [provider]  Multiple Vitamins-Calcium (ONE-A-DAY WOMENS PO) Take by mouth.    [provider]  Na Sulfate-K Sulfate-Mg Sulf 17.5-3.13-1.6 GM/177ML SOLN Take 1 kit by mouth as directed. 08/24/19 09/23/19  Thornton Park, MD  naproxen (NAPROSYN) 500 MG tablet Take 1 tablet (500 mg total) by mouth 2 (two) times daily. 09/17/19   Hall-Potvin, [place], PA-C  norethindrone (AYGESTIN) 5 MG tablet Take 5 mg by mouth daily. 11/10/16   [provider]  omeprazole (PRILOSEC) 20 MG capsule Take 20 mg by mouth as needed.    [provider]  Jonetta Speak LANCETS 62B MISC Use to monitor glucose levels qd 07/18/18   Renato Shin, MD  SitaGLIPtin-MetFORMIN HCl (JANUMET XR) 50-1000 MG TB24 Take 1 tablet by mouth daily. 09/04/19   Renato Shin, MD  vitamin B-12 (CYANOCOBALAMIN) 1000 MCG tablet Take 1,000 mcg by mouth daily as needed.    [provider]  [name]  250-50 MCG/DOSE AEPB USE 1 PUFF TWO TIMES DAILY AS NEEDED 05/03/19   Biagio Borg, MD    Family History Family History  Problem Relation Age of Onset  . Diabetes Mother   . Hypertension Mother   . Asthma Mother   . Allergies Mother   . Breast cancer Mother   . Diabetes Father   . Heart disease Paternal Uncle   . Asthma Maternal Grandmother   . Allergies Maternal Grandmother   . Breast cancer Maternal Grandmother   . Cancer Paternal Grandmother        Brain and Lung  . Breast cancer Maternal Aunt   . Colon cancer Neg Hx   . Esophageal cancer Neg Hx     Social History Social History   Tobacco Use  . Smoking status: Never Smoker  . Smokeless tobacco: Never Used  Substance Use Topics  . Alcohol use: No  . Drug use: No  Allergies   Hydrocodone and Sulfonamide derivatives   Review of Systems As per HPI   Physical Exam Triage Vital Signs ED Triage Vitals  Enc Vitals Group     BP      Pulse      Resp      Temp      Temp src      SpO2      Weight      Height      Head Circumference      Peak Flow      Pain Score      Pain Loc      Pain Edu?      Excl. in [place]?    No data found.  Updated Vital Signs BP 136/89 (BP Location: Left Arm)   Pulse (!) 105   Temp 97.8 F (36.6 C) (Temporal)   Resp 18   SpO2 97%   Visual Acuity Right Eye Distance:   Left Eye Distance:   Bilateral Distance:    Right Eye Near:   Left Eye Near:    Bilateral Near:     Physical Exam Constitutional:      General: She is not in acute distress.    Appearance: She is well-developed. She is not ill-appearing.  HENT:     Head: Normocephalic and atraumatic.  Eyes:     General: No scleral icterus.    Pupils: Pupils are equal, round, and reactive to light.  Cardiovascular:     Rate and Rhythm: Normal rate and regular rhythm.     Heart sounds: No murmur. No gallop.      Comments: HR 93 at bedside Pulmonary:     Effort: Pulmonary effort is normal. No respiratory distress.       Breath sounds: No wheezing.  Abdominal:     General: Abdomen is flat. Bowel sounds are normal. There is no distension.     Palpations: Abdomen is soft.     Tenderness: There is generalized abdominal tenderness.  Genitourinary:    Comments: Patient declined Skin:    General: Skin is warm.     Capillary Refill: Capillary refill takes less than 2 seconds.     Coloration: Skin is not cyanotic, jaundiced, mottled or pale.  Neurological:     Mental Status: She is alert and oriented to person, place, and time.      UC Treatments / Results  Labs (all labs ordered are listed, but only abnormal results are displayed) Labs Reviewed - No data to display  EKG   Radiology No results found.  Procedures Procedures (including critical care time)  Medications Ordered in UC Medications  ketorolac (TORADOL) injection 60 mg (60 mg Intramuscular Given 09/17/19 1250)    Initial Impression / Assessment and Plan / UC Course  I have reviewed the triage vital signs and the nursing notes.  Pertinent labs & imaging results that were available during my care of the patient were reviewed by me and considered in my medical decision making (see chart for details).     Patient afebrile, nontoxic.  No acute distress second to pain.  Patient routinely followed by specialist for her chronic conditions, of which this is an acute exacerbation.  Patient following GYN advice from Friday office visit.  Patient given 60 mg Toradol in office which she tolerated well.  Provided refill of 500 mg naproxen.  Return precautions discussed, patient verbalized understanding and is agreeable to plan. Final Clinical Impressions(s) / UC Diagnoses   Final  diagnoses:  Endometriosis     Discharge Instructions     Take naproxen as directed w/ food. Continue heating pads. Follow up with GYN Mon/Tues.    ED Prescriptions    Medication Sig Dispense Auth. Provider   naproxen (NAPROSYN) 500 MG tablet Take 1 tablet  (500 mg total) by mouth 2 (two) times daily. 30 tablet Hall-Potvin, [place], PA-C     I have reviewed the PDMP during this encounter.   Hall-Potvin, [place], [place] 09/17/19 1250

## 2019-09-17 NOTE — ED Triage Notes (Signed)
Patient presents to [facility] for assessment of lower abdominal cramping and cervical contractions like she is on her period.  States she had a partial hysterectomy 10+ years ago.  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  Patient states the cramping started this time around Thursday.  Was on progesterone for symptoms, but was switched to birth control on Friday by OB/Gyn.  States she is also having left buttock and leg pain starting Thursday as well.  States muscle relaxes, advil, and new birth control without relief.  Heating pad at home as well.

## 2019-09-17 NOTE — ED Notes (Signed)
Patient able to ambulate independently  

## 2019-09-19 ENCOUNTER — Telehealth: Payer: Self-pay | Admitting: Gastroenterology

## 2019-09-19 NOTE — Telephone Encounter (Signed)
As long as she follows the instructions between now and her colonoscopy she should be okay. Thanks.

## 2019-09-19 NOTE — Telephone Encounter (Signed)
Patient notified. Will follow instructions and report for colon on 1/22.

## 2019-09-19 NOTE — Telephone Encounter (Signed)
Spoke to the patient who reports she did not review her colonoscopy instructions until last night and realized she ate about 2 cups of popcorn on Sunday and about 10 nuts last night and 2 teaspoons of benefiber. She wants to know if this would interfere with her colonoscopy and if she needs to reschedule? I told the patient to d/c eating anything that was contraindicated per her instructions and that she was more than likely okay to proceed with her colonoscopy but she wanted to know from Dr. Tarri Glenn. Her colon is scheduled on 1/22, please advise.

## 2019-09-20 ENCOUNTER — Ambulatory Visit (INDEPENDENT_AMBULATORY_CARE_PROVIDER_SITE_OTHER): Payer: BC Managed Care – PPO

## 2019-09-20 ENCOUNTER — Other Ambulatory Visit: Payer: Self-pay | Admitting: Gastroenterology

## 2019-09-20 DIAGNOSIS — Z1159 Encounter for screening for other viral diseases: Secondary | ICD-10-CM

## 2019-09-21 LAB — SARS CORONAVIRUS 2 (TAT 6-24 HRS): SARS Coronavirus 2: NEGATIVE

## 2019-09-22 ENCOUNTER — Telehealth: Payer: Self-pay | Admitting: Gastroenterology

## 2019-09-22 ENCOUNTER — Encounter: Payer: Self-pay | Admitting: *Deleted

## 2019-09-22 ENCOUNTER — Encounter: Payer: BC Managed Care – PPO | Admitting: Gastroenterology

## 2019-09-22 ENCOUNTER — Other Ambulatory Visit: Payer: Self-pay | Admitting: *Deleted

## 2019-09-22 MED ORDER — NA SULFATE-K SULFATE-MG SULF 17.5-3.13-1.6 GM/177ML PO SOLN: 1.0000 | ORAL | 0 refills | Status: DC

## 2019-09-22 MED ORDER — ONDANSETRON 4 MG PO TBDP: ORAL_TABLET | ORAL | 0 refills | Status: DC

## 2019-09-22 NOTE — Telephone Encounter (Signed)
Pt called back stating insurance will not pay for suPrep because it has already been paid for.  Pt would like to know if we have samples.

## 2019-09-22 NOTE — Telephone Encounter (Signed)
Spoke to the patient who did not want the Su-tab prep. She did not think she could swallow all of the pills or drink the amount of water needed to help the prep work. She requested a new prescription for the same prep which was sent to the pharmacy along with the Zofran 4mg  tablets.

## 2019-09-22 NOTE — Telephone Encounter (Signed)
Desiree, can you please help?

## 2019-09-22 NOTE — Telephone Encounter (Signed)
Claire Irwin, Pt was scheduled for a procedure today and rescheduled until 10/11/2019 because she was sick from the prep.  She is requesting new instructions be mailed to her and if another type of prep can be used? Also, will she need another covid test prior to her procedure on 10/11/2019?

## 2019-09-22 NOTE — Telephone Encounter (Signed)
Dr. Tarri Glenn, can the patient be prescribed an antiemetic with her next prep on 2/10? If she decided pills would be better for her, could she try the Su-tab prep instead? Please advise.

## 2019-09-22 NOTE — Telephone Encounter (Signed)
SuPrep sounds like a good idea. She may have Zofran SL 4mg  to use prior to each dose. #5 pills with no refills. Thanks.

## 2019-09-22 NOTE — Telephone Encounter (Signed)
Patient informed she can come by the office on Monday and pick up new instructions and a prep.

## 2019-10-09 ENCOUNTER — Ambulatory Visit (INDEPENDENT_AMBULATORY_CARE_PROVIDER_SITE_OTHER): Payer: BC Managed Care – PPO

## 2019-10-09 ENCOUNTER — Other Ambulatory Visit: Payer: Self-pay | Admitting: Gastroenterology

## 2019-10-09 ENCOUNTER — Other Ambulatory Visit: Payer: Self-pay

## 2019-10-09 DIAGNOSIS — Z1159 Encounter for screening for other viral diseases: Secondary | ICD-10-CM

## 2019-10-10 LAB — SARS CORONAVIRUS 2 (TAT 6-24 HRS): SARS Coronavirus 2: NEGATIVE

## 2019-10-11 ENCOUNTER — Other Ambulatory Visit: Payer: Self-pay

## 2019-10-11 ENCOUNTER — Ambulatory Visit (AMBULATORY_SURGERY_CENTER): Payer: BC Managed Care – PPO | Admitting: Gastroenterology

## 2019-10-11 ENCOUNTER — Encounter: Payer: Self-pay | Admitting: Gastroenterology

## 2019-10-11 VITALS — BP 121/87 | HR 79 | Temp 97.3°F | Resp 18 | Ht 65.0 in | Wt 190.0 lb

## 2019-10-11 DIAGNOSIS — K5900 Constipation, unspecified: Secondary | ICD-10-CM

## 2019-10-11 DIAGNOSIS — D124 Benign neoplasm of descending colon: Secondary | ICD-10-CM

## 2019-10-11 DIAGNOSIS — Z1211 Encounter for screening for malignant neoplasm of colon: Secondary | ICD-10-CM | POA: Diagnosis not present

## 2019-10-11 DIAGNOSIS — D120 Benign neoplasm of cecum: Secondary | ICD-10-CM

## 2019-10-11 MED ORDER — SODIUM CHLORIDE 0.9 % IV SOLN: 500.0000 mL | Freq: Once | INTRAVENOUS | Status: DC

## 2019-10-11 NOTE — Progress Notes (Signed)
Temp by JB. VS by KA.

## 2019-10-11 NOTE — Op Note (Addendum)
[place] Patient Name: Claire Irwin Procedure Date: 10/11/2019 9:36 AM MRN: [id number] Endoscopist: Thornton Park MD, MD Age: 49 Referring MD:  Date of Birth: 11/07/1970 Gender: Female Account #: [id number] Procedure:                Colonoscopy Indications:              Screening for colorectal malignant neoplasm, This                            is the patient's first colonoscopy                           No prior colon cancer screening                           Mother with colon polyps Medicines:                Monitored Anesthesia Care Procedure:                Pre-Anesthesia Assessment:                           - Prior to the procedure, a History and Physical                            was performed, and patient medications and                            allergies were reviewed. The patient's tolerance of                            previous anesthesia was also reviewed. The risks                            and benefits of the procedure and the sedation                            options and risks were discussed with the patient.                            All questions were answered, and informed consent                            was obtained. Prior Anticoagulants: The patient has                            taken no previous anticoagulant or antiplatelet                            agents. ASA Grade Assessment: II - A patient with                            mild systemic disease. After reviewing the risks  and benefits, the patient was deemed in                            satisfactory condition to undergo the procedure.                           After obtaining informed consent, the colonoscope                            was passed under direct vision. Throughout the                            procedure, the patient's blood pressure, pulse, and                            oxygen saturations were monitored continuously. The                             Colonoscope was introduced through the anus and                            advanced to the 3 cm into the ileum. A second                            forward view of the right colon was performed. The                            colonoscopy was performed with difficulty due to                            restricted mobility of the colon. The rectosigmoid                            junction is fixed. Successful completion of the                            procedure was aided by withdrawing the scope and                            replacing with the pediatric colonoscope and                            applying abdominal pressure. The patient tolerated                            the procedure well. The quality of the bowel                            preparation was good. The terminal ileum, ileocecal                            valve, appendiceal orifice, and rectum were  photographed. The Colonoscope was introduced                            through the and advanced to the. Scope In: 9:41:15 AM Scope Out: 10:02:22 AM Scope Withdrawal Time: 0 hours 11 minutes 49 seconds  Total Procedure Duration: 0 hours 21 minutes 7 seconds  Findings:                 The perianal and digital rectal examinations were                            normal.                           Non-bleeding internal hemorrhoids were found. The                            hemorrhoids were small.                           A few small and large-mouthed diverticula were                            found in the sigmoid and descending colon. The                            rectosigmoid junction is fixed in an area of                            diverticulosis.                           A 1 mm polyp was found in the descending colon. The                            polyp was sessile. The polyp was removed with a                            cold snare. Resection and retrieval were complete.                             Estimated blood loss was minimal.                           A less than 1 mm polyp was found in the cecum. The                            polyp was sessile. The polyp was removed with a                            cold snare. Resection and retrieval were complete.                            Estimated blood loss was minimal.  The exam was otherwise without abnormality on                            direct and retroflexion views. Complications:            No immediate complications. Estimated blood loss:                            Minimal. Estimated Blood Loss:     Estimated blood loss was minimal. Impression:               - Non-bleeding internal hemorrhoids.                           - Diverticulosis in the sigmoid colon and                            descending colon.                           - One 1 mm polyp in the descending colon, removed                            with a cold snare. Resected and retrieved.                           - One less than 1 mm polyp in the cecum, removed                            with a cold snare. Resected and retrieved.                           - The examination was otherwise normal on direct                            and retroflexion views. Recommendation:           - Patient has a contact number available for                            emergencies. The signs and symptoms of potential                            delayed complications were discussed with the                            patient. Return to normal activities tomorrow.                            Written discharge instructions were provided to the                            patient.                           - High fiber diet.                           -  Continue present medications.                           - Await pathology results.                           - Repeat colonoscopy date to be determined after                            pending pathology  results are reviewed for                            surveillance. Thornton Park MD, MD 10/11/2019 10:12:37 AM This report has been signed electronically.

## 2019-10-11 NOTE — Progress Notes (Signed)
PT taken to PACU. Monitors in place. VSS. Report given to RN. 

## 2019-10-11 NOTE — Progress Notes (Signed)
Called to room to assist during endoscopic procedure.  Patient ID and intended procedure confirmed with present staff. Received instructions for my participation in the procedure from the performing physician.  

## 2019-10-11 NOTE — Patient Instructions (Signed)
Handouts on polyps, diverticulosis, hemorrhoids and high fiber diet given to you today  Await pathology results     YOU HAD AN ENDOSCOPIC PROCEDURE TODAY AT [place]:   Refer to the procedure report that was given to you for any specific questions about what was found during the examination.  If the procedure report does not answer your questions, please call your gastroenterologist to clarify.  If you requested that your care partner not be given the details of your procedure findings, then the procedure report has been included in a sealed envelope for you to review at your convenience later.  YOU SHOULD EXPECT: Some feelings of bloating in the abdomen. Passage of more gas than usual.  Walking can help get rid of the air that was put into your GI tract during the procedure and reduce the bloating. If you had a lower endoscopy (such as a colonoscopy or flexible sigmoidoscopy) you may notice spotting of blood in your stool or on the toilet paper. If you underwent a bowel prep for your procedure, you may not have a normal bowel movement for a few days.  Please Note:  You might notice some irritation and congestion in your nose or some drainage.  This is from the oxygen used during your procedure.  There is no need for concern and it should clear up in a day or so.  SYMPTOMS TO REPORT IMMEDIATELY:   Following lower endoscopy (colonoscopy or flexible sigmoidoscopy):  Excessive amounts of blood in the stool  Significant tenderness or worsening of abdominal pains  Swelling of the abdomen that is new, acute  Fever of 100F or higher  For urgent or emergent issues, a gastroenterologist can be reached at any hour by calling [phone].   DIET:  We do recommend a small meal at first, but then you may proceed to your regular diet.  Drink plenty of fluids but you should avoid alcoholic beverages for 24 hours.  ACTIVITY:  You should plan to take it easy for the rest of today and  you should NOT DRIVE or use heavy machinery until tomorrow (because of the sedation medicines used during the test).    FOLLOW UP: Our staff will call the number listed on your records 48-72 hours following your procedure to check on you and address any questions or concerns that you may have regarding the information given to you following your procedure. If we do not reach you, we will leave a message.  We will attempt to reach you two times.  During this call, we will ask if you have developed any symptoms of COVID 19. If you develop any symptoms (ie: fever, flu-like symptoms, shortness of breath, cough etc.) before then, please call [phone].  If you test positive for Covid 19 in the 2 weeks post procedure, please call and report this information to [place].    If any biopsies were taken you will be contacted by phone or by letter within the next 1-3 weeks.  Please call us at [phone] if you have not heard about the biopsies in 3 weeks.    SIGNATURES/CONFIDENTIALITY: You and/or your care partner have signed paperwork which will be entered into your electronic medical record.  These signatures attest to the fact that that the information above on your After Visit Summary has been reviewed and is understood.  Full responsibility of the confidentiality of this discharge information lies with you and/or your care-partner.

## 2019-10-13 ENCOUNTER — Telehealth: Payer: Self-pay | Admitting: *Deleted

## 2019-10-13 NOTE — Telephone Encounter (Signed)
  Follow up Call-  Call back number 10/11/2019  Post procedure Call Back phone  # [phone]  Permission to leave phone message Yes  Some recent data might be hidden     Patient questions:  Do you have a fever, pain , or abdominal swelling? No. Pain Score  0 *  Have you tolerated food without any problems? Yes.    Have you been able to return to your normal activities? Yes.    Do you have any questions about your discharge instructions: Diet   No. Medications  No. Follow up visit  No.  Do you have questions or concerns about your Care? No.  Actions: * If pain score is 4 or above: 1. No action needed, pain <4.Have you developed a fever since your procedure? no  2.   Have you had an respiratory symptoms (SOB or cough) since your procedure? no  3.   Have you tested positive for COVID 19 since your procedure no  4.   Have you had any family members/close contacts diagnosed with the COVID 19 since your procedure?  no   If yes to any of these questions please route to Joylene John, RN and Alphonsa Gin, Therapist, sports.

## 2019-10-16 ENCOUNTER — Other Ambulatory Visit: Payer: Self-pay | Admitting: Endocrinology

## 2019-10-17 ENCOUNTER — Encounter: Payer: Self-pay | Admitting: Gastroenterology

## 2019-12-06 ENCOUNTER — Encounter: Payer: Self-pay | Admitting: Internal Medicine

## 2019-12-06 ENCOUNTER — Other Ambulatory Visit: Payer: Self-pay

## 2019-12-06 ENCOUNTER — Ambulatory Visit (INDEPENDENT_AMBULATORY_CARE_PROVIDER_SITE_OTHER): Payer: BC Managed Care – PPO | Admitting: Internal Medicine

## 2019-12-06 VITALS — BP 120/82 | HR 98 | Temp 98.9°F | Ht 65.0 in | Wt 191.8 lb

## 2019-12-06 DIAGNOSIS — R609 Edema, unspecified: Secondary | ICD-10-CM

## 2019-12-06 DIAGNOSIS — F411 Generalized anxiety disorder: Secondary | ICD-10-CM

## 2019-12-06 DIAGNOSIS — R739 Hyperglycemia, unspecified: Secondary | ICD-10-CM | POA: Diagnosis not present

## 2019-12-06 DIAGNOSIS — J4520 Mild intermittent asthma, uncomplicated: Secondary | ICD-10-CM

## 2019-12-06 DIAGNOSIS — Z0000 Encounter for general adult medical examination without abnormal findings: Secondary | ICD-10-CM

## 2019-12-06 DIAGNOSIS — E119 Type 2 diabetes mellitus without complications: Secondary | ICD-10-CM

## 2019-12-06 MED ORDER — HYDROCHLOROTHIAZIDE 12.5 MG PO CAPS: 12.5000 mg | ORAL_CAPSULE | Freq: Every day | ORAL | 3 refills | Status: DC

## 2019-12-06 NOTE — Assessment & Plan Note (Addendum)
Likely venous insufficiency +/- diastolic dysfunction, for hct 12.5 qd,  to f/u any worsening symptoms or concerns  I spent 31 minutes in preparing to see the patient by review of recent labs, imaging and procedures, obtaining and reviewing separately obtained history, communicating with the patient and family or caregiver, ordering medications, tests or procedures, and documenting clinical information in the EHR including the differential Dx, treatment, and any further evaluation and other management of edema, DM, asthma, anxiety

## 2019-12-06 NOTE — Progress Notes (Signed)
Subjective:    Patient ID: Claire Irwin, female    DOB: May 27, 1971, 49 y.o.   MRN: [id number]  HPI  Here to f/u with puffiness to the feet left > right for many months, not really worse but now bothering her, without fever, pain, and Pt denies chest pain, increased sob or doe, wheezing, orthopnea, PND, palpitations, dizziness or syncope.  Has known hx of gr 2 diastolic dysfxn.  Compression stockings not working well recently.  Pt denies new neurological symptoms such as new headache, or facial or extremity weakness or numbness.   Pt denies polydipsia, polyuria  Denies worsening depressive symptoms, suicidal ideation, or panic Past Medical History:  Diagnosis Date  . Allergy   . Asthma   . Diabetes mellitus without complication ([place])   . 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 Endometriosis   . Fibrocystic breast disease   . GERD (gastroesophageal reflux disease)   . History of endometriosis   . Hyperlipidemia 06/29/2011  . Preeclampsia    Past Surgical History:  Procedure Laterality Date  . ABDOMINAL ADHESION SURGERY  07/2004  . CESAREAN SECTION  02/2004  . DILATION AND CURETTAGE, DIAGNOSTIC / THERAPEUTIC    . PARTIAL HYSTERECTOMY     still have ovaries    reports that she has never smoked. She has never used smokeless tobacco. She reports that she does not drink alcohol or use drugs. family history includes Allergies in her maternal grandmother and mother; Asthma in her maternal grandmother and mother; Breast cancer in her maternal aunt, maternal grandmother, and mother; Cancer in her paternal grandmother; Diabetes in her father and mother; Heart disease in her paternal uncle; Hypertension in her mother. Allergies  Allergen Reactions  . Hydrocodone   . Sulfonamide Derivatives    Current Outpatient Medications on File Prior to Visit  Medication Sig Dispense Refill  . albuterol (VENTOLIN HFA) 108 (90 Base) MCG/ACT inhaler USE 2 PUFFS 4 TIMES DAILY AS NEEDED 8.5 g  2  . Ascorbic Acid (VITAMIN C) 500 MG CAPS Take 1 capsule by mouth daily as needed.    Marland Kitchen aspirin EC 81 MG tablet Take 1 tablet (81 mg total) by mouth daily. 90 tablet 11  . Calcium Carbonate Antacid (TUMS PO) Take 1 tablet by mouth as needed.    . Cholecalciferol (VITAMIN D-3) 125 MCG (5000 UT) TABS Take 1 tablet by mouth daily as needed.    . Diclofenac Sodium (PENNSAID) 2 % SOLN Apply topically as needed.    . empagliflozin (JARDIANCE) 10 MG TABS tablet Take 5 mg by mouth daily. 45 tablet 3  . famotidine (PEPCID) 20 MG tablet Take 1 tablet (20 mg total) by mouth 2 (two) times daily. (Patient taking differently: Take 20 mg by mouth as needed. ) 180 tablet 3  . ferrous sulfate 325 (65 FE) MG tablet Take 325 mg by mouth daily as needed.    . linaclotide (LINZESS) 290 MCG CAPS capsule Take 1 capsule (290 mcg total) by mouth daily before breakfast. (Patient taking differently: Take 290 mcg by mouth as needed (Mainly takes on weekend). ) 30 capsule 11  . loratadine (CLARITIN) 10 MG tablet Take 10 mg by mouth daily as needed for allergies.    . Multiple Vitamins-Calcium (ONE-A-DAY WOMENS PO) Take by mouth.    . naproxen (NAPROSYN) 500 MG tablet Take 1 tablet (500 mg total) by mouth 2 (two) times daily. 30 tablet 0  . norethindrone (AYGESTIN) 5 MG tablet Take  5 mg by mouth daily.  10  . omeprazole (PRILOSEC) 20 MG capsule Take 20 mg by mouth as needed.    . ondansetron (ZOFRAN-ODT) 4 MG disintegrating tablet Place 1-2 tablets under tongue 30 minutes prior to each prep to reduce nausea. 5 tablet 0  . ONETOUCH DELICA LANCETS 99991111 MISC Use to monitor glucose levels qd 100 each 1  . ONETOUCH ULTRA test strip USE TO MONITOR GLUCOSE LEVELS ONCE PER DAY 50 strip 12  . SitaGLIPtin-MetFORMIN HCl (JANUMET XR) 50-1000 MG TB24 Take 1 tablet by mouth daily. 90 tablet 3  . vitamin B-12 (CYANOCOBALAMIN) 1000 MCG tablet Take 1,000 mcg by mouth daily as needed.    Grant Ruts INHUB 250-50 MCG/DOSE AEPB USE 1 PUFF TWO TIMES  DAILY AS NEEDED 180 each 3   No current facility-administered medications on file prior to visit.   Review of Systems All otherwise neg per pt    Objective:   Physical Exam BP 120/82   Pulse 98   Temp 98.9 F (37.2 C)   Ht 5\' 5"  (1.651 m)   Wt 191 lb 12.8 oz (87 kg)   SpO2 98%   BMI 31.92 kg/m  VS noted,  Constitutional: Pt appears in NAD HENT: Head: NCAT.  Right Ear: External ear normal.  Left Ear: External ear normal.  Eyes: . Pupils are equal, round, and reactive to light. Conjunctivae and EOM are normal Nose: without d/c or deformity Neck: Neck supple. Gross normal ROM Cardiovascular: Normal rate and regular rhythm.   Pulmonary/Chest: Effort normal and breath sounds without rales or wheezing.  Abd:  Soft, NT, ND, + BS, no organomegaly Neurological: Pt is alert. At baseline orientation, motor grossly intact Skin: Skin is warm. No rashes, other new lesions, trace left > right pedal LE edema Psychiatric: Pt behavior is normal without agitation  All otherwise neg per pt Lab Results  Component Value Date   WBC 6.8 03/15/2019   HGB 15.5 (H) 03/15/2019   HCT 45.0 03/15/2019   PLT 269.0 03/15/2019   GLUCOSE 108 (H) 03/15/2019   CHOL 160 03/11/2018   TRIG 88.0 03/11/2018   HDL 47.80 03/11/2018   LDLCALC 95 03/11/2018   ALT 20 03/15/2019   AST 17 03/15/2019   NA 140 03/15/2019   K 4.1 03/15/2019   CL 105 03/15/2019   CREATININE 1.16 03/15/2019   BUN 9 03/15/2019   CO2 25 03/15/2019   TSH 1.97 03/15/2019   HGBA1C 6.8 (A) 09/04/2019   MICROALBUR <0.7 03/11/2018      Assessment & Plan:

## 2019-12-06 NOTE — Patient Instructions (Addendum)
Please take all new medication as prescribed - the mild fluid pil (hydrochlorothizide), and use the compression stockings during the day  Please continue all other medications as before, and refills have been done if requested.  Please have the pharmacy call with any other refills you may need.  Please continue your efforts at being more active, low cholesterol diet, and weight control.  Please keep your appointments with your specialists as you may have planned  Please make an Appointment to return in 6 months, or sooner if needed, also with Lab Appointment for testing done 3-5 days before at the [facility] (so this is for TWO appointments - please see the scheduling desk as you leave)

## 2019-12-06 NOTE — Assessment & Plan Note (Signed)
stable overall by history and exam, recent data reviewed with pt, and pt to continue medical treatment as before,  to f/u any worsening symptoms or concerns  

## 2020-01-01 ENCOUNTER — Other Ambulatory Visit: Payer: Self-pay | Admitting: Obstetrics and Gynecology

## 2020-01-01 DIAGNOSIS — N6320 Unspecified lump in the left breast, unspecified quadrant: Secondary | ICD-10-CM

## 2020-01-02 ENCOUNTER — Other Ambulatory Visit: Payer: Self-pay

## 2020-01-04 ENCOUNTER — Ambulatory Visit: Payer: BC Managed Care – PPO | Admitting: Endocrinology

## 2020-01-04 ENCOUNTER — Other Ambulatory Visit: Payer: Self-pay

## 2020-01-04 ENCOUNTER — Encounter: Payer: Self-pay | Admitting: Endocrinology

## 2020-01-04 VITALS — BP 120/72 | HR 104 | Ht 65.0 in | Wt 188.8 lb

## 2020-01-04 DIAGNOSIS — E119 Type 2 diabetes mellitus without complications: Secondary | ICD-10-CM | POA: Diagnosis not present

## 2020-01-04 LAB — POCT GLYCOSYLATED HEMOGLOBIN (HGB A1C): Hemoglobin A1C: 6.8 % — AB (ref 4.0–5.6)

## 2020-01-04 MED ORDER — JARDIANCE 10 MG PO TABS: 5.0000 mg | ORAL_TABLET | Freq: Every day | ORAL | 3 refills | Status: DC

## 2020-01-04 MED ORDER — JANUMET XR 50-1000 MG PO TB24: 2.0000 | ORAL_TABLET | Freq: Every day | ORAL | 3 refills | Status: DC

## 2020-01-04 NOTE — Progress Notes (Signed)
Subjective:    Patient ID: Claire Irwin, female    DOB: 01-18-1971, 49 y.o.   MRN: [id number]  HPI Pt returns for f/u of diabetes mellitus: DM type: 2 Dx'ed: 0000000 Complications: renal insuff.  Therapy: 3 oral meds GDM: never DKA: never Severe hypoglycemia: never.  Pancreatitis: never.  Other: she has never taken insulin; she did not tolerate farxiga (rash).   Interval history: Pt says she sometimes misses the Jardiance, because she separates from the [place].  pt states she feels well in general.   Past Medical History:  Diagnosis Date  . Allergy   . Asthma   . Diabetes mellitus without complication ([place])   . 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 Endometriosis   . Fibrocystic breast disease   . GERD (gastroesophageal reflux disease)   . History of endometriosis   . Hyperlipidemia 06/29/2011  . Preeclampsia     Past Surgical History:  Procedure Laterality Date  . ABDOMINAL ADHESION SURGERY  07/2004  . CESAREAN SECTION  02/2004  . DILATION AND CURETTAGE, DIAGNOSTIC / THERAPEUTIC    . PARTIAL HYSTERECTOMY     still have ovaries    Social History   Socioeconomic History  . Marital status: Legally Separated    Spouse name: Not on file  . Number of children: 2  . Years of education: Not on file  . Highest education level: Not on file  Occupational History  . Occupation: Pharmacist, hospital  Tobacco Use  . Smoking status: Never Smoker  . Smokeless tobacco: Never Used  Substance and Sexual Activity  . Alcohol use: No  . Drug use: No  . Sexual activity: Not on file  Other Topics Concern  . Not on file  Social History Narrative  . Not on file   Social Determinants of Health   Financial Resource Strain:   . Difficulty of Paying Living Expenses:   Food Insecurity:   . Worried About Charity fundraiser in the Last Year:   . Arboriculturist in the Last Year:   Transportation Needs:   . Film/video editor (Medical):   Marland Kitchen Lack of Transportation  (Non-Medical):   Physical Activity:   . Days of Exercise per Week:   . Minutes of Exercise per Session:   Stress:   . Feeling of Stress :   Social Connections:   . Frequency of Communication with Friends and Family:   . Frequency of Social Gatherings with Friends and Family:   . Attends Religious Services:   . Active Member of Clubs or Organizations:   . Attends Archivist Meetings:   Marland Kitchen Marital Status:   Intimate Partner Violence:   . Fear of Current or Ex-Partner:   . Emotionally Abused:   Marland Kitchen Physically Abused:   . Sexually Abused:     Current Outpatient Medications on File Prior to Visit  Medication Sig Dispense Refill  . albuterol (VENTOLIN HFA) 108 (90 Base) MCG/ACT inhaler USE 2 PUFFS 4 TIMES DAILY AS NEEDED 8.5 g 2  . Ascorbic Acid (VITAMIN C) 500 MG CAPS Take 1 capsule by mouth daily as needed.    Marland Kitchen aspirin EC 81 MG tablet Take 1 tablet (81 mg total) by mouth daily. 90 tablet 11  . Calcium Carbonate Antacid (TUMS PO) Take 1 tablet by mouth as needed.    . Cholecalciferol (VITAMIN D-3) 125 MCG (5000 UT) TABS Take 1 tablet by mouth daily as needed.    Marland Kitchen  Diclofenac Sodium (PENNSAID) 2 % SOLN Apply topically as needed.    . famotidine (PEPCID) 20 MG tablet Take 1 tablet (20 mg total) by mouth 2 (two) times daily. (Patient taking differently: Take 20 mg by mouth as needed. ) 180 tablet 3  . ferrous sulfate 325 (65 FE) MG tablet Take 325 mg by mouth daily as needed.    . hydrochlorothiazide (MICROZIDE) 12.5 MG capsule Take 1 capsule (12.5 mg total) by mouth daily. 90 capsule 3  . linaclotide (LINZESS) 290 MCG CAPS capsule Take 1 capsule (290 mcg total) by mouth daily before breakfast. (Patient taking differently: Take 290 mcg by mouth as needed (Mainly takes on weekend). ) 30 capsule 11  . loratadine (CLARITIN) 10 MG tablet Take 10 mg by mouth daily as needed for allergies.    . Multiple Vitamins-Calcium (ONE-A-DAY WOMENS PO) Take by mouth.    . naproxen (NAPROSYN) 500 MG  tablet Take 1 tablet (500 mg total) by mouth 2 (two) times daily. 30 tablet 0  . norethindrone (AYGESTIN) 5 MG tablet Take 5 mg by mouth daily.  10  . ONETOUCH DELICA LANCETS 99991111 MISC Use to monitor glucose levels qd 100 each 1  . ONETOUCH ULTRA test strip USE TO MONITOR GLUCOSE LEVELS ONCE PER DAY 50 strip 12  . vitamin B-12 (CYANOCOBALAMIN) 1000 MCG tablet Take 1,000 mcg by mouth daily as needed.    Grant Ruts INHUB 250-50 MCG/DOSE AEPB USE 1 PUFF TWO TIMES DAILY AS NEEDED 180 each 3   No current facility-administered medications on file prior to visit.    Allergies  Allergen Reactions  . Hydrocodone   . Sulfonamide Derivatives     Family History  Problem Relation Age of Onset  . Diabetes Mother   . Hypertension Mother   . Asthma Mother   . Allergies Mother   . Breast cancer Mother   . Diabetes Father   . Heart disease Paternal Uncle   . Asthma Maternal Grandmother   . Allergies Maternal Grandmother   . Breast cancer Maternal Grandmother   . Cancer Paternal Grandmother        Brain and Lung  . Breast cancer Maternal Aunt   . Colon cancer Neg Hx   . Esophageal cancer Neg Hx     BP 120/72 (BP Location: Left Arm, Patient Position: Sitting, Cuff Size: Large)   Pulse (!) 104   Ht 5\' 5"  (1.651 m)   Wt 188 lb 12.8 oz (85.6 kg)   SpO2 99%   BMI 31.42 kg/m   Review of Systems Denies nausea.      Objective:   Physical Exam VITAL SIGNS:  See vs page GENERAL: no distress Pulses: dorsalis pedis intact bilat.   MSK: no deformity of the feet CV: no leg edema Skin:  no ulcer on the feet.  normal color and temp on the feet.  Neuro: sensation is intact to touch on the feet.   Lab Results  Component Value Date   CREATININE 1.16 03/15/2019   BUN 9 03/15/2019   NA 140 03/15/2019   K 4.1 03/15/2019   CL 105 03/15/2019   CO2 25 03/15/2019     Lab Results  Component Value Date   HGBA1C 6.8 (A) 01/04/2020        Assessment & Plan:  Type 2 DM: well-controlled  Medication error, new.  I told pt she can take all DM meds in the morning, to help her remember.  Patient Instructions  I have sent a  prescription to [facility], to increase Janumet to 2 per day. Please continue the same Jardiance. check your blood sugar once a day.  vary the time of day when you check, between before the 3 meals, and at bedtime.  also check if you have symptoms of your blood sugar being too high or too low.  please keep a record of the readings and bring it to your next appointment here (or you can bring the meter itself).  You can write it on any piece of paper.  please call us sooner if your blood sugar goes below 70, or if you have a lot of readings over 200. Please come back for a follow-up appointment in 4 months.

## 2020-01-04 NOTE — Patient Instructions (Addendum)
I have sent a prescription to [facility], to increase Janumet to 2 per day. Please continue the same Jardiance. check your blood sugar once a day.  vary the time of day when you check, between before the 3 meals, and at bedtime.  also check if you have symptoms of your blood sugar being too high or too low.  please keep a record of the readings and bring it to your next appointment here (or you can bring the meter itself).  You can write it on any piece of paper.  please call us sooner if your blood sugar goes below 70, or if you have a lot of readings over 200. Please come back for a follow-up appointment in 4 months.

## 2020-01-08 ENCOUNTER — Ambulatory Visit
Admission: RE | Admit: 2020-01-08 | Discharge: 2020-01-08 | Disposition: A | Discharge status: Home / Self Care | Payer: BC Managed Care – PPO | Source: Ambulatory Visit | Attending: Obstetrics and Gynecology | Admitting: Obstetrics and Gynecology

## 2020-01-08 ENCOUNTER — Other Ambulatory Visit: Payer: Self-pay

## 2020-01-08 DIAGNOSIS — N6320 Unspecified lump in the left breast, unspecified quadrant: Secondary | ICD-10-CM

## 2020-01-22 ENCOUNTER — Encounter: Payer: Self-pay | Admitting: Podiatry

## 2020-01-22 ENCOUNTER — Other Ambulatory Visit: Payer: Self-pay | Admitting: Podiatry

## 2020-01-22 ENCOUNTER — Ambulatory Visit (INDEPENDENT_AMBULATORY_CARE_PROVIDER_SITE_OTHER): Payer: BC Managed Care – PPO

## 2020-01-22 ENCOUNTER — Ambulatory Visit: Payer: BC Managed Care – PPO | Admitting: Podiatry

## 2020-01-22 ENCOUNTER — Ambulatory Visit: Payer: BC Managed Care – PPO

## 2020-01-22 ENCOUNTER — Other Ambulatory Visit: Payer: Self-pay

## 2020-01-22 VITALS — Temp 97.3°F

## 2020-01-22 DIAGNOSIS — M779 Enthesopathy, unspecified: Secondary | ICD-10-CM

## 2020-01-22 DIAGNOSIS — M7752 Other enthesopathy of left foot: Secondary | ICD-10-CM | POA: Diagnosis not present

## 2020-01-22 DIAGNOSIS — M25572 Pain in left ankle and joints of left foot: Secondary | ICD-10-CM

## 2020-01-24 NOTE — Progress Notes (Signed)
Subjective:   Patient ID: Claire Irwin, female   DOB: 49 y.o.   MRN: [id number]   HPI Patient presents stating she is getting pain in her left ankle that she is concerned about it also some discoloration of the second digit left that she wanted checked.  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  Do not see a problem with the second digit and that we will just be watched  X-rays were negative for signs of arthritis of the subtalar joint left or into the ankle joint left

## 2020-01-25 ENCOUNTER — Ambulatory Visit: Payer: BC Managed Care – PPO | Admitting: Podiatry

## 2020-02-12 ENCOUNTER — Ambulatory Visit: Payer: BC Managed Care – PPO | Admitting: Podiatry

## 2020-02-13 ENCOUNTER — Encounter: Payer: Self-pay | Admitting: Family Medicine

## 2020-02-13 ENCOUNTER — Other Ambulatory Visit: Payer: Self-pay

## 2020-02-13 ENCOUNTER — Ambulatory Visit: Payer: BC Managed Care – PPO | Admitting: Family Medicine

## 2020-02-13 VITALS — BP 120/76 | HR 99 | Ht 65.0 in | Wt 190.0 lb

## 2020-02-13 DIAGNOSIS — R5383 Other fatigue: Secondary | ICD-10-CM

## 2020-02-13 DIAGNOSIS — R252 Cramp and spasm: Secondary | ICD-10-CM | POA: Diagnosis not present

## 2020-02-13 DIAGNOSIS — E119 Type 2 diabetes mellitus without complications: Secondary | ICD-10-CM | POA: Diagnosis not present

## 2020-02-13 LAB — COMPREHENSIVE METABOLIC PANEL
ALT: 21 U/L (ref 0–35)
AST: 20 U/L (ref 0–37)
Albumin: 4.3 g/dL (ref 3.5–5.2)
Alkaline Phosphatase: 89 U/L (ref 39–117)
BUN: 9 mg/dL (ref 6–23)
CO2: 31 mEq/L (ref 19–32)
Calcium: 9.8 mg/dL (ref 8.4–10.5)
Chloride: 103 mEq/L (ref 96–112)
Creatinine, Ser: 1.09 mg/dL (ref 0.40–1.20)
GFR: 64.7 mL/min (ref >60.00)
Glucose, Bld: 167 mg/dL — ABNORMAL HIGH (ref 70–99)
Potassium: 3.6 mEq/L (ref 3.5–5.1)
Sodium: 140 mEq/L (ref 135–145)
Total Bilirubin: 0.5 mg/dL (ref 0.2–1.2)
Total Protein: 7.4 g/dL (ref 6.0–8.3)

## 2020-02-13 LAB — CBC
HCT: 43.1 % (ref 36.0–46.0)
Hemoglobin: 14.6 g/dL (ref 12.0–15.0)
MCHC: 33.8 g/dL (ref 30.0–36.0)
MCV: 88.4 fl (ref 78.0–100.0)
Platelets: 308 10*3/uL (ref 150.0–400.0)
RBC: 4.88 Mil/uL (ref 3.87–5.11)
RDW: 14.1 % (ref 11.5–15.5)
WBC: 5.8 10*3/uL (ref 4.0–10.5)

## 2020-02-13 LAB — MAGNESIUM: Magnesium: 2 mg/dL (ref 1.5–2.5)

## 2020-02-13 LAB — TSH: TSH: 1.04 u[IU]/mL (ref 0.35–4.50)

## 2020-02-13 MED ORDER — GABAPENTIN 100 MG PO CAPS: 100.0000 mg | ORAL_CAPSULE | Freq: Every day | ORAL | 1 refills | Status: DC

## 2020-02-13 NOTE — Progress Notes (Signed)
   Rito Ehrlich, am serving as a Education administrator for Dr. Lynne Leader.  Claire Irwin is a 49 y.o. female who presents to [place] at [facility] today for L leg pain and cramping.  She was last seen by Dr. Tamala Julian on 05/03/19 for neck and back pain. Patient states whole left leg has been cramping and twitching for about a month. Describes pain as more aggravating than painful. Patient states that she has been seeing a podiatrist for plantar fasciitis and when describing her leg issues was told to see sports medicine she is currently receiving care from Dr. Paulla Dolly for plantar fasciitis.  She is using a night splint which she notes exacerbates her symptoms a little bit.  LE swelling: yes comes and goes LE numbness/tingling: no Aggravating factors: covers touching leg or any pressure, worse at night Treatments tried: ibuprofen, muscle relaxer and OTC muscle cramping meds    Pertinent review of systems: No fevers or chills  Relevant historical information: Diabetes   Exam:  BP 120/76 (BP Location: Left Arm, Patient Position: Sitting, Cuff Size: Normal)   Pulse 99   Ht '5\' 5"'$  (1.651 m)   Wt 190 lb (86.2 kg)   SpO2 98%   BMI 31.62 kg/m  General: Well Developed, well nourished, and in no acute distress.   MSK: Left leg normal-appearing nontender normal motion.  Pulses capillary fill and sensation are intact distally.      Assessment and Plan: 49 y.o. female with left leg twitching and cramping at bedtime.  Etiology is somewhat unclear.  Could be restless leg syndrome or neuropathy or radiculopathy.  Will evaluate for cramping with CBC metabolic panel TSH magnesium.  We will start initial treatment with gabapentin but potentially will move to baclofen or Requip in the near future if not improving.  Recheck in a month patient will keep me updated sooner.   PDMP not reviewed this encounter. Orders Placed This Encounter  Procedures  . CBC    Standing Status:   Future     Number of Occurrences:   1    Standing Expiration Date:   02/12/2021  . Comp Met (CMET)    Standing Status:   Future    Number of Occurrences:   1    Standing Expiration Date:   02/12/2021  . TSH    Standing Status:   Future    Number of Occurrences:   1    Standing Expiration Date:   02/12/2021  . Magnesium    Standing Status:   Future    Number of Occurrences:   1    Standing Expiration Date:   02/12/2021   Meds ordered this encounter  Medications  . gabapentin (NEURONTIN) 100 MG capsule    Sig: Take 1-3 capsules (100-300 mg total) by mouth at bedtime. For leg twitching or cramping    Dispense:  90 capsule    Refill:  1     Discussed warning signs or symptoms. Please see discharge instructions. Patient expresses understanding.   The above documentation has been reviewed and is accurate and complete Lynne Leader, M.D.

## 2020-02-13 NOTE — Patient Instructions (Addendum)
Thank you for coming in today. Get labs today.  I will get results to you likely tomorrow.  Start gabapentin at bedtime.  Let me know if not helpful.  Could try baclofen next and then Requip next.   Recheck back in about 1 month.    Gabapentin capsules or tablets What is this medicine? GABAPENTIN (GA ba pen tin) is used to control seizures in certain types of epilepsy. It is also used to treat certain types of nerve pain. This medicine may be used for other purposes; ask your health care provider or pharmacist if you have questions. COMMON BRAND NAME(S): Active-PAC with Gabapentin, Gabarone, Neurontin What should I tell my health care provider before I take this medicine? They need to know if you have any of these conditions:  history of drug abuse or alcohol abuse problem  kidney disease  lung or breathing disease  suicidal thoughts, plans, or attempt; a previous suicide attempt by you or a family member  an unusual or allergic reaction to gabapentin, other medicines, foods, dyes, or preservatives  pregnant or trying to get pregnant  breast-feeding How should I use this medicine? Take this medicine by mouth with a glass of water. Follow the directions on the prescription label. You can take it with or without food. If it upsets your stomach, take it with food. Take your medicine at regular intervals. Do not take it more often than directed. Do not stop taking except on your doctor's advice. If you are directed to break the 600 or 800 mg tablets in half as part of your dose, the extra half tablet should be used for the next dose. If you have not used the extra half tablet within 28 days, it should be thrown away. A special MedGuide will be given to you by the pharmacist with each prescription and refill. Be sure to read this information carefully each time. Talk to your pediatrician regarding the use of this medicine in children. While this drug may be prescribed for children as  young as 3 years for selected conditions, precautions do apply. Overdosage: If you think you have taken too much of this medicine contact a poison control center or emergency room at once. NOTE: This medicine is only for you. Do not share this medicine with others. What if I miss a dose? If you miss a dose, take it as soon as you can. If it is almost time for your next dose, take only that dose. Do not take double or extra doses. What may interact with this medicine? This medicine may interact with the following medications:  alcohol  antihistamines for allergy, cough, and cold  certain medicines for anxiety or sleep  certain medicines for depression like amitriptyline, fluoxetine, sertraline  certain medicines for seizures like phenobarbital, primidone  certain medicines for stomach problems  general anesthetics like halothane, isoflurane, methoxyflurane, propofol  local anesthetics like lidocaine, pramoxine, tetracaine  medicines that relax muscles for surgery  narcotic medicines for pain  phenothiazines like chlorpromazine, mesoridazine, prochlorperazine, thioridazine This list may not describe all possible interactions. Give your health care provider a list of all the medicines, herbs, non-prescription drugs, or dietary supplements you use. Also tell them if you smoke, drink alcohol, or use illegal drugs. Some items may interact with your medicine. What should I watch for while using this medicine? Visit your doctor or health care provider for regular checks on your progress. You may want to keep a record at home of how you feel  your condition is responding to treatment. You may want to share this information with your doctor or health care provider at each visit. You should contact your doctor or health care provider if your seizures get worse or if you have any new types of seizures. Do not stop taking this medicine or any of your seizure medicines unless instructed by your  doctor or health care provider. Stopping your medicine suddenly can increase your seizures or their severity. This medicine may cause serious skin reactions. They can happen weeks to months after starting the medicine. Contact your health care provider right away if you notice fevers or flu-like symptoms with a rash. The rash may be red or purple and then turn into blisters or peeling of the skin. Or, you might notice a red rash with swelling of the face, lips or lymph nodes in your neck or under your arms. Wear a medical identification bracelet or chain if you are taking this medicine for seizures, and carry a card that lists all your medications. You may get drowsy, dizzy, or have blurred vision. Do not drive, use machinery, or do anything that needs mental alertness until you know how this medicine affects you. To reduce dizzy or fainting spells, do not sit or stand up quickly, especially if you are an older patient. Alcohol can increase drowsiness and dizziness. Avoid alcoholic drinks. Your mouth may get dry. Chewing sugarless gum or sucking hard candy, and drinking plenty of water will help. The use of this medicine may increase the chance of suicidal thoughts or actions. Pay special attention to how you are responding while on this medicine. Any worsening of mood, or thoughts of suicide or dying should be reported to your health care provider right away. Women who become pregnant while using this medicine may enroll in the [place] Pregnancy Registry by calling [phone]. This registry collects information about the safety of antiepileptic drug use during pregnancy. What side effects may I notice from receiving this medicine? Side effects that you should report to your doctor or health care professional as soon as possible:  allergic reactions like skin rash, itching or hives, swelling of the face, lips, or tongue  breathing problems  rash, fever, and swollen lymph  nodes  redness, blistering, peeling or loosening of the skin, including inside the mouth  suicidal thoughts, mood changes Side effects that usually do not require medical attention (report to your doctor or health care professional if they continue or are bothersome):  dizziness  drowsiness  headache  nausea, vomiting  swelling of ankles, feet, hands  tiredness This list may not describe all possible side effects. Call your doctor for medical advice about side effects. You may report side effects to FDA at 1-800-FDA-1088. Where should I keep my medicine? Keep out of reach of children. This medicine may cause accidental overdose and death if it taken by other adults, children, or pets. Mix any unused medicine with a substance like cat litter or coffee grounds. Then throw the medicine away in a sealed container like a sealed bag or a coffee can with a lid. Do not use the medicine after the expiration date. Store at room temperature between 15 and 30 degrees C (59 and 86 degrees F). NOTE: This sheet is a summary. It may not cover all possible information. If you have questions about this medicine, talk to your doctor, pharmacist, or health care provider.  2020 Elsevier/Gold Standard (2018-11-18 14:16:43)

## 2020-02-14 NOTE — Progress Notes (Signed)
Electrolytes look normal.  The salts in your blood including potassium and sodium are normal. Thyroid test is normal.No signs of anemia.Lab work-up was normal.

## 2020-02-15 ENCOUNTER — Telehealth: Payer: Self-pay | Admitting: Family Medicine

## 2020-02-15 LAB — HM DIABETES EYE EXAM

## 2020-02-15 NOTE — Telephone Encounter (Signed)
Pt started Gabapentin, but it upset her stomach and gave her a HA. She takes Linzess and is very sensitive to stomach upset.  Wants to know Dr. Clovis Riley opinion on the following supplements: Nerve Factor Magnilife Calm Legs

## 2020-02-16 MED ORDER — BACLOFEN 10 MG PO TABS: 5.0000 mg | ORAL_TABLET | Freq: Every evening | ORAL | 3 refills | Status: DC | PRN

## 2020-02-16 NOTE — Telephone Encounter (Signed)
Stop gabapentin. The next medication that we discussed is a possibility as baclofen.  [facility] [address]. Take one half tab to 1 tab at bedtime. The supplements are reasonable to try.  You did not have low magnesium or low sodium or potassium so not sure how helpful the supplements are going to be but they are not going to hurt you and are worth at least trying.  If that is not effective my next step is Requip like we discussed.  Please let me know how baclofen works.  I have backup plans as well.

## 2020-02-16 NOTE — Telephone Encounter (Signed)
Called pt and informed her of Dr. Clovis Riley response.  Pt verbalizes understanding and has no additional questions/concerns.

## 2020-03-13 ENCOUNTER — Ambulatory Visit: Payer: BC Managed Care – PPO | Admitting: Family Medicine

## 2020-03-14 ENCOUNTER — Other Ambulatory Visit: Payer: Self-pay | Admitting: Internal Medicine

## 2020-05-07 ENCOUNTER — Other Ambulatory Visit: Payer: Self-pay | Admitting: Family Medicine

## 2020-05-09 ENCOUNTER — Ambulatory Visit: Payer: BC Managed Care – PPO | Admitting: Endocrinology

## 2020-05-09 ENCOUNTER — Other Ambulatory Visit: Payer: Self-pay

## 2020-05-09 ENCOUNTER — Encounter: Payer: Self-pay | Admitting: Endocrinology

## 2020-05-09 VITALS — BP 112/64 | HR 95 | Ht 65.0 in | Wt 191.4 lb

## 2020-05-09 DIAGNOSIS — E119 Type 2 diabetes mellitus without complications: Secondary | ICD-10-CM | POA: Diagnosis not present

## 2020-05-09 LAB — POCT GLYCOSYLATED HEMOGLOBIN (HGB A1C): Hemoglobin A1C: 6.2 % — AB (ref 4.0–5.6)

## 2020-05-09 MED ORDER — JANUMET XR 50-1000 MG PO TB24: 1.0000 | ORAL_TABLET | Freq: Every day | ORAL | 3 refills | Status: DC

## 2020-05-09 NOTE — Patient Instructions (Addendum)
Please continue the same medications.   check your blood sugar once a day.  vary the time of day when you check, between before the 3 meals, and at bedtime.  also check if you have symptoms of your blood sugar being too high or too low.  please keep a record of the readings and bring it to your next appointment here (or you can bring the meter itself).  You can write it on any piece of paper.  please call us sooner if your blood sugar goes below 70, or if you have a lot of readings over 200.  Please come back for a follow-up appointment in 4-5 months  

## 2020-05-09 NOTE — Progress Notes (Signed)
Subjective:    Patient ID: Claire Irwin, female    DOB: [birth year], 49 y.o.   MRN: [id number]  HPI Pt returns for f/u of diabetes mellitus: DM type: 2 Dx'ed: 0175 Complications: renal insuff.  Therapy: 3 oral meds GDM: never DKA: never Severe hypoglycemia: never.  Pancreatitis: never.  Other: she has never taken insulin; she did not tolerate farxiga (rash).   Interval history: Pt says she takes the Jardiance as rx'ed, but she still takes the Janumet just 1 tab qd.  pt states she feels well in general.   Past Medical History:  Diagnosis Date  . Allergy   . Asthma   . Diabetes mellitus without complication ([place])   . 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 Endometriosis   . Fibrocystic breast disease   . GERD (gastroesophageal reflux disease)   . History of endometriosis   . Hyperlipidemia 06/29/2011  . Preeclampsia     Past Surgical History:  Procedure Laterality Date  . ABDOMINAL ADHESION SURGERY  07/2004  . CESAREAN SECTION  02/2004  . DILATION AND CURETTAGE, DIAGNOSTIC / THERAPEUTIC    . PARTIAL HYSTERECTOMY     still have ovaries    Social History   Socioeconomic History  . Marital status: Legally Separated    Spouse name: Not on file  . Number of children: 2  . Years of education: Not on file  . Highest education level: Not on file  Occupational History  . Occupation: Pharmacist, hospital  Tobacco Use  . Smoking status: Never Smoker  . Smokeless tobacco: Never Used  Vaping Use  . Vaping Use: Never used  Substance and Sexual Activity  . Alcohol use: No  . Drug use: No  . Sexual activity: Not on file  Other Topics Concern  . Not on file  Social History Narrative  . Not on file   Social Determinants of Health   Financial Resource Strain:   . Difficulty of Paying Living Expenses: Not on file  Food Insecurity:   . Worried About Charity fundraiser in the Last Year: Not on file  . Ran Out of Food in the Last Year: Not on file  Transportation  Needs:   . Lack of Transportation (Medical): Not on file  . Lack of Transportation (Non-Medical): Not on file  Physical Activity:   . Days of Exercise per Week: Not on file  . Minutes of Exercise per Session: Not on file  Stress:   . Feeling of Stress : Not on file  Social Connections:   . Frequency of Communication with Friends and Family: Not on file  . Frequency of Social Gatherings with Friends and Family: Not on file  . Attends Religious Services: Not on file  . Active Member of Clubs or Organizations: Not on file  . Attends Archivist Meetings: Not on file  . Marital Status: Not on file  Intimate Partner Violence:   . Fear of Current or Ex-Partner: Not on file  . Emotionally Abused: Not on file  . Physically Abused: Not on file  . Sexually Abused: Not on file    Current Outpatient Medications on File Prior to Visit  Medication Sig Dispense Refill  . albuterol (VENTOLIN HFA) 108 (90 Base) MCG/ACT inhaler USE 2 PUFFS 4 TIMES DAILY AS NEEDED 8.5 g 2  . Ascorbic Acid (VITAMIN C) 500 MG CAPS Take 1 capsule by mouth daily as needed.    Marland Kitchen aspirin EC 81 MG  tablet Take 1 tablet (81 mg total) by mouth daily. 90 tablet 11  . baclofen (LIORESAL) 10 MG tablet Take 0.5-1 tablets (5-10 mg total) by mouth at bedtime as needed (leg cramping). 30 each 3  . Calcium Carbonate Antacid (TUMS PO) Take 1 tablet by mouth as needed.    . Cholecalciferol (VITAMIN D-3) 125 MCG (5000 UT) TABS Take 1 tablet by mouth daily as needed.    . Diclofenac Sodium (PENNSAID) 2 % SOLN Apply topically as needed.    . empagliflozin (JARDIANCE) 10 MG TABS tablet Take 5 mg by mouth daily. 45 tablet 3  . famotidine (PEPCID) 20 MG tablet TAKE 1 TABLET BY MOUTH TWICE A DAY 180 tablet 3  . ferrous sulfate 325 (65 FE) MG tablet Take 325 mg by mouth daily as needed.    . hydrochlorothiazide (MICROZIDE) 12.5 MG capsule Take 1 capsule (12.5 mg total) by mouth daily. 90 capsule 3  . linaclotide (LINZESS) 290 MCG CAPS  capsule Take 1 capsule (290 mcg total) by mouth daily before breakfast. (Patient taking differently: Take 290 mcg by mouth as needed (Mainly takes on weekend). ) 30 capsule 11  . loratadine (CLARITIN) 10 MG tablet Take 10 mg by mouth daily as needed for allergies.    . Multiple Vitamins-Calcium (ONE-A-DAY WOMENS PO) Take by mouth.    . naproxen (NAPROSYN) 500 MG tablet Take 1 tablet (500 mg total) by mouth 2 (two) times daily. 30 tablet 0  . norethindrone (AYGESTIN) 5 MG tablet Take 5 mg by mouth daily.  10  . ONETOUCH DELICA LANCETS 99B MISC Use to monitor glucose levels qd 100 each 1  . ONETOUCH ULTRA test strip USE TO MONITOR GLUCOSE LEVELS ONCE PER DAY 50 strip 12  . vitamin B-12 (CYANOCOBALAMIN) 1000 MCG tablet Take 1,000 mcg by mouth daily as needed.    Grant Ruts INHUB 250-50 MCG/DOSE AEPB USE 1 PUFF TWO TIMES DAILY AS NEEDED 180 each 3   No current facility-administered medications on file prior to visit.    Allergies  Allergen Reactions  . Hydrocodone   . Sulfonamide Derivatives     Family History  Problem Relation Age of Onset  . Diabetes Mother   . Hypertension Mother   . Asthma Mother   . Allergies Mother   . Breast cancer Mother   . Diabetes Father   . Heart disease Paternal Uncle   . Asthma Maternal Grandmother   . Allergies Maternal Grandmother   . Breast cancer Maternal Grandmother   . Cancer Paternal Grandmother        Brain and Lung  . Breast cancer Maternal Aunt   . Colon cancer Neg Hx   . Esophageal cancer Neg Hx     BP 112/64   Pulse 95   Ht 5\' 5"  (1.651 m)   Wt 191 lb 6.4 oz (86.8 kg)   SpO2 97%   BMI 31.85 kg/m    Review of Systems     Objective:   Physical Exam VITAL SIGNS:  See vs page GENERAL: no distress Pulses: dorsalis pedis intact bilat.   MSK: no deformity of the feet CV: no leg edema Skin:  no ulcer on the feet.  normal color and temp on the feet. Neuro: sensation is intact to touch on the feet  Lab Results  Component Value  Date   HGBA1C 6.2 (A) 05/09/2020       Assessment & Plan:  Type 2 DM: well-controlled  Patient Instructions  Please continue the  same medications. check your blood sugar once a day.  vary the time of day when you check, between before the 3 meals, and at bedtime.  also check if you have symptoms of your blood sugar being too high or too low.  please keep a record of the readings and bring it to your next appointment here (or you can bring the meter itself).  You can write it on any piece of paper.  please call us sooner if your blood sugar goes below 70, or if you have a lot of readings over 200. Please come back for a follow-up appointment in 4-5 months.

## 2020-06-24 ENCOUNTER — Other Ambulatory Visit: Payer: Self-pay | Admitting: Endocrinology

## 2020-06-24 MED ORDER — EMPAGLIFLOZIN 10 MG PO TABS: 10.0000 mg | ORAL_TABLET | Freq: Every day | ORAL | 3 refills | Status: DC

## 2020-06-24 NOTE — Telephone Encounter (Signed)
Medication Refill Request  Did you call your pharmacy and request this refill first? Yes  . If patient has not contacted pharmacy first, instruct them to do so for future refills.  . Remind them that contacting the pharmacy for their refill is the quickest method to get the refill.  . Refill policy also stated that it will take anywhere between 24-72 hours to receive the refill.    Name of medication? jardiance 10 mg  Is this a 90 day supply? No 30   Name and location of pharmacy?  [id number] [place], [place] [address] [place] [address]  Phone:  [phone] Fax:  [phone]

## 2020-07-17 ENCOUNTER — Other Ambulatory Visit: Payer: Self-pay

## 2020-07-18 ENCOUNTER — Ambulatory Visit (INDEPENDENT_AMBULATORY_CARE_PROVIDER_SITE_OTHER): Payer: BC Managed Care – PPO | Admitting: Internal Medicine

## 2020-07-18 ENCOUNTER — Encounter: Payer: Self-pay | Admitting: Internal Medicine

## 2020-07-18 VITALS — BP 110/70 | HR 58 | Temp 98.7°F | Ht 65.0 in | Wt 189.0 lb

## 2020-07-18 DIAGNOSIS — Z1159 Encounter for screening for other viral diseases: Secondary | ICD-10-CM

## 2020-07-18 DIAGNOSIS — K219 Gastro-esophageal reflux disease without esophagitis: Secondary | ICD-10-CM | POA: Diagnosis not present

## 2020-07-18 DIAGNOSIS — Z0000 Encounter for general adult medical examination without abnormal findings: Secondary | ICD-10-CM | POA: Diagnosis not present

## 2020-07-18 DIAGNOSIS — E1165 Type 2 diabetes mellitus with hyperglycemia: Secondary | ICD-10-CM

## 2020-07-18 DIAGNOSIS — R059 Cough, unspecified: Secondary | ICD-10-CM

## 2020-07-18 MED ORDER — FLUTICASONE-SALMETEROL 250-50 MCG/DOSE IN AEPB: INHALATION_SPRAY | RESPIRATORY_TRACT | 3 refills | Status: DC

## 2020-07-18 MED ORDER — PANTOPRAZOLE SODIUM 40 MG PO TBEC: 40.0000 mg | DELAYED_RELEASE_TABLET | Freq: Every day | ORAL | 3 refills | Status: DC

## 2020-07-18 MED ORDER — FAMOTIDINE 20 MG PO TABS: 20.0000 mg | ORAL_TABLET | Freq: Two times a day (BID) | ORAL | 3 refills | Status: DC

## 2020-07-18 NOTE — Patient Instructions (Signed)
Please take all new medication as prescribed - the protonix  Please continue all other medications as before, and refills have been done if requested.  Please have the pharmacy call with any other refills you may need.  Please continue your efforts at being more active, low cholesterol diet, and weight control.  You are otherwise up to date with prevention measures today.  Please keep your appointments with your specialists as you may have planned  Please make an Appointment to return in 6 months, or sooner if needed

## 2020-07-18 NOTE — Progress Notes (Signed)
Subjective:    Patient ID: Claire Irwin, female    DOB: [birth year], 49 y.o.   MRN: [id number]  HPI  Here for wellness and f/u;  Overall doing ok;  Pt denies Chest pain, worsening SOB, DOE, wheezing, orthopnea, PND, worsening LE edema, palpitations, dizziness or syncope.  Pt denies neurological change such as new headache, facial or extremity weakness.  Pt denies polydipsia, polyuria, or low sugar symptoms. Pt states overall good compliance with treatment and medications, good tolerability, and has been trying to follow appropriate diet.  Pt denies worsening depressive symptoms, suicidal ideation or panic. No fever, night sweats, wt loss, loss of appetite, or other constitutional symptoms.  Pt states good ability with ADL's, has low fall risk, home safety reviewed and adequate, no other significant changes in hearing or vision, and only occasionally active with exercise.  Plans to get booster covid soon   Declines labs today Does have mild worsening reflux after stopped her ppi, without abd pain, dysphagia, n/v, bowel change or blood. Past Medical History:  Diagnosis Date  . Allergy   . Asthma   . Diabetes mellitus without complication ([place])   . 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 Endometriosis   . Fibrocystic breast disease   . GERD (gastroesophageal reflux disease)   . History of endometriosis   . Hyperlipidemia 06/29/2011  . Preeclampsia    Past Surgical History:  Procedure Laterality Date  . ABDOMINAL ADHESION SURGERY  07/2004  . CESAREAN SECTION  02/2004  . DILATION AND CURETTAGE, DIAGNOSTIC / THERAPEUTIC    . PARTIAL HYSTERECTOMY     still have ovaries    reports that she has never smoked. She has never used smokeless tobacco. She reports that she does not drink alcohol and does not use drugs. family history includes Allergies in her maternal grandmother and mother; Asthma in her maternal grandmother and mother; Breast cancer in her maternal aunt, maternal  grandmother, and mother; Cancer in her paternal grandmother; Diabetes in her father and mother; Heart disease in her paternal uncle; Hypertension in her mother. Allergies  Allergen Reactions  . Hydrocodone   . Sulfonamide Derivatives    Current Outpatient Medications on File Prior to Visit  Medication Sig Dispense Refill  . albuterol (VENTOLIN HFA) 108 (90 Base) MCG/ACT inhaler USE 2 PUFFS 4 TIMES DAILY AS NEEDED 8.5 g 2  . aspirin EC 81 MG tablet Take 1 tablet (81 mg total) by mouth daily. 90 tablet 11  . Calcium Carbonate Antacid (TUMS PO) Take 1 tablet by mouth as needed.    . Cholecalciferol (VITAMIN D-3) 125 MCG (5000 UT) TABS Take 1 tablet by mouth daily as needed.    . Diclofenac Sodium (PENNSAID) 2 % SOLN Apply topically as needed.    . empagliflozin (JARDIANCE) 10 MG TABS tablet Take 1 tablet (10 mg total) by mouth daily. 90 tablet 3  . linaclotide (LINZESS) 290 MCG CAPS capsule Take 1 capsule (290 mcg total) by mouth daily before breakfast. (Patient taking differently: Take 290 mcg by mouth as needed (Mainly takes on weekend). ) 30 capsule 11  . loratadine (CLARITIN) 10 MG tablet Take 10 mg by mouth daily as needed for allergies.    . Multiple Vitamins-Calcium (ONE-A-DAY WOMENS PO) Take by mouth.    . norethindrone (AYGESTIN) 5 MG tablet Take 5 mg by mouth daily.  10  . ONETOUCH DELICA LANCETS 95A MISC Use to monitor glucose levels qd 100 each 1  .  ONETOUCH ULTRA test strip USE TO MONITOR GLUCOSE LEVELS ONCE PER DAY 50 strip 12  . SitaGLIPtin-MetFORMIN HCl (JANUMET XR) 50-1000 MG TB24 Take 1 tablet by mouth daily. 90 tablet 3  . vitamin B-12 (CYANOCOBALAMIN) 1000 MCG tablet Take 1,000 mcg by mouth daily as needed.     No current facility-administered medications on file prior to visit.   Review of Systems All otherwise neg per pt     Objective:   Physical Exam BP 110/70 (BP Location: Left Arm, Patient Position: Sitting, Cuff Size: Large)   Pulse (!) 58   Temp 98.7 F (37.1  C) (Oral)   Ht 5\' 5"  (1.651 m)   Wt 189 lb (85.7 kg)   SpO2 95%   BMI 31.45 kg/m  VS noted,  Constitutional: Pt appears in NAD HENT: Head: NCAT.  Right Ear: External ear normal.  Left Ear: External ear normal.  Eyes: . Pupils are equal, round, and reactive to light. Conjunctivae and EOM are normal Nose: without d/c or deformity Neck: Neck supple. Gross normal ROM Cardiovascular: Normal rate and regular rhythm.   Pulmonary/Chest: Effort normal and breath sounds without rales or wheezing.  Abd:  Soft, NT, ND, + BS, no organomegaly Neurological: Pt is alert. At baseline orientation, motor grossly intact Skin: Skin is warm. No rashes, other new lesions, no LE edema Psychiatric: Pt behavior is normal without agitation  All otherwise neg per pt Lab Results  Component Value Date   WBC 5.8 02/13/2020   HGB 14.6 02/13/2020   HCT 43.1 02/13/2020   PLT 308.0 02/13/2020   GLUCOSE 167 (H) 02/13/2020   CHOL 160 03/11/2018   TRIG 88.0 03/11/2018   HDL 47.80 03/11/2018   LDLCALC 95 03/11/2018   ALT 21 02/13/2020   AST 20 02/13/2020   NA 140 02/13/2020   K 3.6 02/13/2020   CL 103 02/13/2020   CREATININE 1.09 02/13/2020   BUN 9 02/13/2020   CO2 31 02/13/2020   TSH 1.04 02/13/2020   HGBA1C 6.2 (A) 05/09/2020   MICROALBUR <0.7 03/11/2018      Assessment & Plan:

## 2020-07-20 ENCOUNTER — Encounter: Payer: Self-pay | Admitting: Internal Medicine

## 2020-07-20 NOTE — Assessment & Plan Note (Signed)

## 2020-07-20 NOTE — Assessment & Plan Note (Signed)
To restart ppi today

## 2020-07-20 NOTE — Assessment & Plan Note (Signed)
stable overall by history and exam, recent data reviewed with pt, and pt to continue medical treatment as before,  to f/u any worsening symptoms or concerns, declnies labs today

## 2020-08-12 ENCOUNTER — Other Ambulatory Visit: Payer: Self-pay

## 2020-08-12 MED ORDER — LINACLOTIDE 290 MCG PO CAPS: 290.0000 ug | ORAL_CAPSULE | Freq: Every day | ORAL | 11 refills | Status: DC

## 2020-08-19 ENCOUNTER — Other Ambulatory Visit: Payer: Self-pay

## 2020-08-26 NOTE — Progress Notes (Signed)
[facility] [address] [place] [address] Phone: [phone] Subjective:   I Claire Irwin am serving as a Neurosurgeon for Dr. Antoine Primas.  This visit occurred during the SARS-CoV-2 public health emergency.  Safety protocols were in place, including screening questions prior to the visit, additional usage of staff PPE, and extensive cleaning of exam room while observing appropriate contact time as indicated for disinfecting solutions.   I'm seeing this patient by the request  of:  Corwin Levins, MD  CC: Back pain and knee pain  [id number]  Claire Irwin is a 49 y.o. female coming in with complaint of  left leg and bilateral. OMT 05/03/2019. Saw Dr. Denyse Amass in June 2021. Patient states she has leg and bilateral knee pain that is worse on the left. Stairs cause a sharp pain and she states it feels as if her knee is going to give out. Laying in the bed at night on her back the left foot is uncomfortable. Has been using pennsaid. States her pain is mostly muscular. Hamstring pain. 5-6/10 at its worse.   Medications patient has been prescribed: Patient tried gabapentin but had side effects      Patient did have an MRI of the lumbar spine in 2011 that was independently visualized by me.  This showed that patient did have very mild curvature of the back but no significant nerve impingement    Reviewed prior external information including notes and imaging from previsou exam, outside providers and external EMR if available.   As well as notes that were available from care everywhere and other healthcare systems.  Past medical history, social, surgical and family history all reviewed in electronic medical record.  No pertanent information unless stated regarding to the chief complaint.   Past Medical History:  Diagnosis Date  . Allergy   . Asthma   . Diabetes mellitus without complication (HCC)   . DJD (degenerative joint disease) of knee    LEFT   . Endometriosis   . Fibrocystic breast disease   . GERD (gastroesophageal reflux disease)   . History of endometriosis   . Hyperlipidemia 06/29/2011  . Preeclampsia     Allergies  Allergen Reactions  . Hydrocodone   . Sulfonamide Derivatives      Review of Systems:  No headache, visual changes, nausea, vomiting, diarrhea, constipation, dizziness, abdominal pain, skin rash, fevers, chills, night sweats, weight loss, swollen lymph nodes,joint swelling, chest pain, shortness of breath, mood changes. POSITIVE muscle aches, body aches  Objective  Blood pressure 122/80, pulse 97, height 5\' 5"  (1.651 m), weight 190 lb (86.2 kg), SpO2 100 %.   General: No apparent distress alert and oriented x3 mood and affect normal, dressed appropriately.  HEENT: Pupils equal, extraocular movements intact  Respiratory: Patient's speak in full sentences and does not appear short of breath  Cardiovascular: No lower extremity edema, non tender, no erythema  Mild antalgic gait noted. Patient's left ankle has some very mild swelling compared to the contralateral side mostly over the sinus tarsi.  Patient is tender but patient actually has severe more pain in the foot, leg and back with any type of dorsiflexion of the foot as well as with the straight leg test.  Patient states it is a burning sensation.  No significant swelling noted of the calf noted.  No pain in the popliteal area. Bilateral knees show some mild lateral tracking of the patella left greater than right.  Positive patellar grind  on the left.  No significant instability of the knee.  Mild pain over the medial joint space.  Full range of motion otherwise of the knees  [id number]; 15 additional minutes spent for Therapeutic exercises as stated in above notes.  This included exercises focusing on stretching, strengthening, with significant focus on eccentric aspects.   Long term goals include an improvement in range of motion, strength, endurance as well as  avoiding reinjury. Patient's frequency would include in 1-2 times a day, 3-5 times a week for a duration of 6-12 weeks. Low back exercises that included:  Pelvic tilt/bracing instruction to focus on control of the pelvic girdle and lower abdominal muscles  Glute strengthening exercises, focusing on proper firing of the glutes without engaging the low back muscles Proper stretching techniques for maximum relief for the hamstrings, hip flexors, low back and some rotation where tolerated    Proper technique shown and discussed handout in great detail with ATC.  All questions were discussed and answered.       Assessment and Plan:        The above documentation has been reviewed and is accurate and complete Claire Pulley, DO       Note: This dictation was prepared with Dragon dictation along with smaller phrase technology. Any transcriptional errors that result from this process are unintentional.

## 2020-08-27 ENCOUNTER — Ambulatory Visit (INDEPENDENT_AMBULATORY_CARE_PROVIDER_SITE_OTHER): Payer: BC Managed Care – PPO

## 2020-08-27 ENCOUNTER — Ambulatory Visit: Payer: BC Managed Care – PPO | Admitting: Family Medicine

## 2020-08-27 ENCOUNTER — Other Ambulatory Visit: Payer: Self-pay

## 2020-08-27 ENCOUNTER — Encounter: Payer: Self-pay | Admitting: Family Medicine

## 2020-08-27 VITALS — BP 122/80 | HR 97 | Ht 65.0 in | Wt 190.0 lb

## 2020-08-27 DIAGNOSIS — M25562 Pain in left knee: Secondary | ICD-10-CM | POA: Diagnosis not present

## 2020-08-27 DIAGNOSIS — M2550 Pain in unspecified joint: Secondary | ICD-10-CM

## 2020-08-27 DIAGNOSIS — G8929 Other chronic pain: Secondary | ICD-10-CM

## 2020-08-27 DIAGNOSIS — M25561 Pain in right knee: Secondary | ICD-10-CM | POA: Diagnosis not present

## 2020-08-27 DIAGNOSIS — M545 Low back pain, unspecified: Secondary | ICD-10-CM

## 2020-08-27 LAB — IBC PANEL
Iron: 50 ug/dL (ref 42–145)
Saturation Ratios: 11.7 % — ABNORMAL LOW (ref 20.0–50.0)
Transferrin: 304 mg/dL (ref 212.0–360.0)

## 2020-08-27 LAB — COMPREHENSIVE METABOLIC PANEL
ALT: 17 U/L (ref 0–35)
AST: 18 U/L (ref 0–37)
Albumin: 4.2 g/dL (ref 3.5–5.2)
Alkaline Phosphatase: 107 U/L (ref 39–117)
BUN: 15 mg/dL (ref 6–23)
CO2: 30 mEq/L (ref 19–32)
Calcium: 9.6 mg/dL (ref 8.4–10.5)
Chloride: 101 mEq/L (ref 96–112)
Creatinine, Ser: 1.03 mg/dL (ref 0.40–1.20)
GFR: 64.08 mL/min (ref >60.00)
Glucose, Bld: 157 mg/dL — ABNORMAL HIGH (ref 70–99)
Potassium: 3.6 mEq/L (ref 3.5–5.1)
Sodium: 136 mEq/L (ref 135–145)
Total Bilirubin: 0.3 mg/dL (ref 0.2–1.2)
Total Protein: 7.6 g/dL (ref 6.0–8.3)

## 2020-08-27 LAB — CBC WITH DIFFERENTIAL/PLATELET
Basophils Absolute: 0 10*3/uL (ref 0.0–0.1)
Basophils Relative: 0.3 % (ref 0.0–3.0)
Eosinophils Absolute: 0.1 10*3/uL (ref 0.0–0.7)
Eosinophils Relative: 1 % (ref 0.0–5.0)
HCT: 44.5 % (ref 36.0–46.0)
Hemoglobin: 15 g/dL (ref 12.0–15.0)
Lymphocytes Relative: 32.3 % (ref 12.0–46.0)
Lymphs Abs: 2.5 10*3/uL (ref 0.7–4.0)
MCHC: 33.7 g/dL (ref 30.0–36.0)
MCV: 86.8 fl (ref 78.0–100.0)
Monocytes Absolute: 0.6 10*3/uL (ref 0.1–1.0)
Monocytes Relative: 7.1 % (ref 3.0–12.0)
Neutro Abs: 4.6 10*3/uL (ref 1.4–7.7)
Neutrophils Relative %: 59.3 % (ref 43.0–77.0)
Platelets: 312 10*3/uL (ref 150.0–400.0)
RBC: 5.13 Mil/uL — ABNORMAL HIGH (ref 3.87–5.11)
RDW: 14.4 % (ref 11.5–15.5)
WBC: 7.8 10*3/uL (ref 4.0–10.5)

## 2020-08-27 LAB — VITAMIN D 25 HYDROXY (VIT D DEFICIENCY, FRACTURES): VITD: 76.56 ng/mL (ref 30.00–100.00)

## 2020-08-27 LAB — VITAMIN B12: Vitamin B-12: 1148 pg/mL — ABNORMAL HIGH (ref 211–911)

## 2020-08-27 LAB — FERRITIN: Ferritin: 23.2 ng/mL (ref 10.0–291.0)

## 2020-08-27 LAB — SEDIMENTATION RATE: Sed Rate: 10 mm/hr (ref 0–20)

## 2020-08-27 MED ORDER — PREDNISONE 20 MG PO TABS
20.0000 mg | ORAL_TABLET | Freq: Every day | ORAL | 0 refills | Status: DC
Start: 2020-08-27 — End: 2020-10-15

## 2020-08-27 NOTE — Patient Instructions (Addendum)
Good to see you Brace for the left knee  Labs today Prednisone 20 mg daily for 5 days Do not take any other antiinflammatories Ready about cymbalta Exercises for back and knee If pain worsens we may need further imaging See me again in 3-4 week

## 2020-08-27 NOTE — Assessment & Plan Note (Signed)
Bilateral knee pain.  Patient does have some lateral tracking of the patella on the left greater than the right.  Patient is having pain just overall.  Laboratory work-up ordered, x-rays ordered.  Home exercises given.  Discussed icing regimen.  Worsening pain will consider potential physical therapy before we consider injections.  Follow-up again 6 to 8 weeks

## 2020-08-27 NOTE — Assessment & Plan Note (Signed)
Patient is having more left-sided pain again.  Patient pain seems to be though more in the lumbar spine.  Having signs that is consistent with potential lumbar radiculopathy.  Attempted gabapentin by another provider but patient was unable to tolerate it.  We discussed Cymbalta which patient declined.  Due to the radicular symptoms and the cramping at night will get laboratory work-up as well.  New x-rays ordered.  Patient will be following up with me again in 3 to 4 weeks.  Worsening symptoms advanced imaging may be warranted.

## 2020-09-17 NOTE — Progress Notes (Unsigned)
[facility] [address] [place] Phone: [phone] Subjective:   I Claire Irwin am serving as a Education administrator for Dr. Hulan Saas.  This visit occurred during the SARS-CoV-2 public health emergency.  Safety protocols were in place, including screening questions prior to the visit, additional usage of staff PPE, and extensive cleaning of exam room while observing appropriate contact time as indicated for disinfecting solutions.   I'm seeing this patient by the request  of:  Biagio Borg, MD  CC:   [id number]   08/27/2020 Bilateral knee pain.  Patient does have some lateral tracking of the patella on the left greater than the right.  Patient is having pain just overall.  Laboratory work-up ordered, x-rays ordered.  Home exercises given.  Discussed icing regimen.  Worsening pain will consider potential physical therapy before we consider injections.  Follow-up again 6 to 8 weeks  Patient is having more left-sided pain again.  Patient pain seems to be though more in the lumbar spine.  Having signs that is consistent with potential lumbar radiculopathy.  Attempted gabapentin by another provider but patient was unable to tolerate it.  We discussed Cymbalta which patient declined.  Due to the radicular symptoms and the cramping at night will get laboratory work-up as well.  New x-rays ordered.  Patient will be following up with me again in 3 to 4 weeks.  Worsening symptoms advanced imaging may be warranted.  Update  Claire Irwin is a 50 y.o. female coming in with complaint of back and B knee pain Patient states her back has been doing better. Knees go back and forth. Brace helps.   Xray 08/27/2020 B knee  IMPRESSION: Negative.  Xray 08/27/2020 Lumbar IMPRESSION: No acute osseous abnormality identified in the lumbar spine. Relatively preserved disc spaces, mild degenerative endplate spurring.         Reviewed prior external  information including notes and imaging from previsou exam, outside providers and external EMR if available.   As well as notes that were available from care everywhere and other healthcare systems.  Past medical history, social, surgical and family history all reviewed in electronic medical record.  No pertanent information unless stated regarding to the chief complaint.   Past Medical History:  Diagnosis Date  . Allergy   . Asthma   . Diabetes mellitus without complication ([place])   . 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 Endometriosis   . Fibrocystic breast disease   . GERD (gastroesophageal reflux disease)   . History of endometriosis   . Hyperlipidemia 06/29/2011  . Preeclampsia     Allergies  Allergen Reactions  . Hydrocodone   . Sulfonamide Derivatives      Review of Systems:  No headache, visual changes, nausea, vomiting, diarrhea, constipation, dizziness, abdominal pain, skin rash, fevers, chills, night sweats, weight loss, swollen lymph nodes, body aches, joint swelling, chest pain, shortness of breath, mood changes. POSITIVE muscle aches  Objective  Blood pressure 130/80, pulse 87, height 5\' 5"  (1.651 m), weight 195 lb (88.5 kg), SpO2 98 %.   General: No apparent distress alert and oriented x3 mood and affect normal, dressed appropriately.  HEENT: Pupils equal, extraocular movements intact  Respiratory: Patient's speak in full sentences and does not appear short of breath  Cardiovascular: No lower extremity edema, non tender, no erythema  Gait antalgic  MSK:   Knee exam bilaterally still has some mild lateral tracking of the  patella noted.  Greater than right.  Mild tenderness over the patellofemoral joint and mild over the medial joint space Back -back exam mild loss of lordosis.  Diffuse tenderness noted from the cervical thoracic all the way to the lumbosacral area.  Does have some mild increase in discomfort with extension of the back.  Negative  straight leg test.  Tender to palpation in the piriformis right greater than left as well.  Osteopathic findings  C6 flexed rotated and side bent left T3 extended rotated and side bent right inhaled rib T9 extended rotated and side bent left L2 flexed rotated and side bent right Sacrum right on right       Assessment and Plan:    Nonallopathic problems  Decision today to treat with OMT was based on Physical Exam  After verbal consent patient was treated with HVLA, ME, FPR techniques in cervical, rib, thoracic, lumbar, and sacral  areas  Patient tolerated the procedure well with improvement in symptoms  Patient given exercises, stretches and lifestyle modifications  See medications in patient instructions if given  Patient will follow up in 4-8 weeks      The above documentation has been reviewed and is accurate and complete Lyndal Pulley, DO       Note: This dictation was prepared with Dragon dictation along with smaller phrase technology. Any transcriptional errors that result from this process are unintentional.

## 2020-09-18 ENCOUNTER — Encounter: Payer: Self-pay | Admitting: Family Medicine

## 2020-09-18 ENCOUNTER — Other Ambulatory Visit: Payer: Self-pay

## 2020-09-18 ENCOUNTER — Ambulatory Visit: Payer: BC Managed Care – PPO | Admitting: Family Medicine

## 2020-09-18 VITALS — BP 130/80 | HR 87 | Ht 65.0 in | Wt 195.0 lb

## 2020-09-18 DIAGNOSIS — M25562 Pain in left knee: Secondary | ICD-10-CM

## 2020-09-18 DIAGNOSIS — G8929 Other chronic pain: Secondary | ICD-10-CM | POA: Diagnosis not present

## 2020-09-18 DIAGNOSIS — M999 Biomechanical lesion, unspecified: Secondary | ICD-10-CM | POA: Diagnosis not present

## 2020-09-18 DIAGNOSIS — M25561 Pain in right knee: Secondary | ICD-10-CM

## 2020-09-18 DIAGNOSIS — M545 Low back pain, unspecified: Secondary | ICD-10-CM | POA: Diagnosis not present

## 2020-09-18 MED ORDER — DULOXETINE HCL 20 MG PO CPEP: 20.0000 mg | ORAL_CAPSULE | Freq: Every day | ORAL | 1 refills | Status: DC

## 2020-09-18 NOTE — Patient Instructions (Addendum)
Good to see you Keep doing home exercises Always consider PT Cymbalta 20 mg daily See me again in 4-6 weeks  Duloxetine Delayed-Release Capsules What is this medicine? DULOXETINE (doo LOX e teen) is used to treat depression, anxiety, and different types of chronic pain. This medicine may be used for other purposes; ask your health care provider or pharmacist if you have questions. COMMON BRAND NAME(S): Cymbalta, Creig Hines, Irenka What should I tell my health care provider before I take this medicine? They need to know if you have any of these conditions:  bipolar disorder  glaucoma  high blood pressure  kidney disease  liver disease  seizures  suicidal thoughts, plans or attempt; a previous suicide attempt by you or a family member  take medicines that treat or prevent blood clots  taken medicines called MAOIs like Carbex, Eldepryl, Marplan, Nardil, and Parnate within 14 days  trouble passing urine  an unusual reaction to duloxetine, other medicines, foods, dyes, or preservatives  pregnant or trying to get pregnant  breast-feeding How should I use this medicine? Take this medicine by mouth with a glass of water. Follow the directions on the prescription label. Do not crush, cut or chew some capsules of this medicine. Some capsules may be opened and sprinkled on applesauce. Check with your doctor or pharmacist if you are not sure. You can take this medicine with or without food. Take your medicine at regular intervals. Do not take your medicine more often than directed. Do not stop taking this medicine suddenly except upon the advice of your doctor. Stopping this medicine too quickly may cause serious side effects or your condition may worsen. A special MedGuide will be given to you by the pharmacist with each prescription and refill. Be sure to read this information carefully each time. Talk to your pediatrician regarding the use of this medicine in children. While this drug  may be prescribed for children as young as 25 years of age for selected conditions, precautions do apply. Overdosage: If you think you have taken too much of this medicine contact a poison control center or emergency room at once. NOTE: This medicine is only for you. Do not share this medicine with others. What if I miss a dose? If you miss a dose, take it as soon as you can. If it is almost time for your next dose, take only that dose. Do not take double or extra doses. What may interact with this medicine? Do not take this medicine with any of the following medications:  desvenlafaxine  levomilnacipran  linezolid  MAOIs like Carbex, Eldepryl, Emsam, Marplan, Nardil, and Parnate  methylene blue (injected into a vein)  milnacipran  safinamide  thioridazine  venlafaxine  viloxazine This medicine may also interact with the following medications:  alcohol  amphetamines  aspirin and aspirin-like medicines  certain antibiotics like ciprofloxacin and enoxacin  certain medicines for blood pressure, heart disease, irregular heart beat  certain medicines for depression, anxiety, or psychotic disturbances  certain medicines for migraine headache like almotriptan, eletriptan, frovatriptan, naratriptan, rizatriptan, sumatriptan, zolmitriptan  certain medicines that treat or prevent blood clots like warfarin, enoxaparin, and dalteparin  cimetidine  fentanyl  lithium  NSAIDS, medicines for pain and inflammation, like ibuprofen or naproxen  phentermine  procarbazine  rasagiline  sibutramine  St. John's wort  theophylline  tramadol  tryptophan This list may not describe all possible interactions. Give your health care provider a list of all the medicines, herbs, non-prescription drugs, or dietary supplements  you use. Also tell them if you smoke, drink alcohol, or use illegal drugs. Some items may interact with your medicine. What should I watch for while using this  medicine? Tell your doctor if your symptoms do not get better or if they get worse. Visit your doctor or healthcare provider for regular checks on your progress. Because it may take several weeks to see the full effects of this medicine, it is important to continue your treatment as prescribed by your doctor. This medicine may cause serious skin reactions. They can happen weeks to months after starting the medicine. Contact your healthcare provider right away if you notice fevers or flu-like symptoms with a rash. The rash may be red or purple and then turn into blisters or peeling of the skin. Or, you might notice a red rash with swelling of the face, lips, or lymph nodes in your neck or under your arms. Patients and their families should watch out for new or worsening thoughts of suicide or depression. Also watch out for sudden changes in feelings such as feeling anxious, agitated, panicky, irritable, hostile, aggressive, impulsive, severely restless, overly excited and hyperactive, or not being able to sleep. If this happens, especially at the beginning of treatment or after a change in dose, call your healthcare provider. You may get drowsy or dizzy. Do not drive, use machinery, or do anything that needs mental alertness until you know how this medicine affects you. Do not stand or sit up quickly, especially if you are an older patient. This reduces the risk of dizzy or fainting spells. Alcohol may interfere with the effect of this medicine. Avoid alcoholic drinks. This medicine can cause an increase in blood pressure. This medicine can also cause a sudden drop in your blood pressure, which may make you feel faint and increase the chance of a fall. These effects are most common when you first start the medicine or when the dose is increased, or during use of other medicines that can cause a sudden drop in blood pressure. Check with your doctor for instructions on monitoring your blood pressure while taking  this medicine. Your mouth may get dry. Chewing sugarless gum or sucking hard candy, and drinking plenty of water, may help. Contact your doctor if the problem does not go away or is severe. What side effects may I notice from receiving this medicine? Side effects that you should report to your doctor or health care professional as soon as possible:  allergic reactions like skin rash, itching or hives, swelling of the face, lips, or tongue  anxious  breathing problems  confusion  changes in vision  chest pain  confusion  elevated mood, decreased need for sleep, racing thoughts, impulsive behavior  eye pain  fast, irregular heartbeat  feeling faint or lightheaded, falls  feeling agitated, angry, or irritable  hallucination, loss of contact with reality  high blood pressure  loss of balance or coordination  palpitations  redness, blistering, peeling or loosening of the skin, including inside the mouth  restlessness, pacing, inability to keep still  seizures  stiff muscles  suicidal thoughts or other mood changes  trouble passing urine or change in the amount of urine  trouble sleeping  unusual bleeding or bruising  unusually weak or tired  vomiting  yellowing of the eyes or skin Side effects that usually do not require medical attention (report to your doctor or health care professional if they continue or are bothersome):  change in sex drive or performance  change in appetite or weight  constipation  dizziness  dry mouth  headache  increased sweating  nausea  tired This list may not describe all possible side effects. Call your doctor for medical advice about side effects. You may report side effects to FDA at 1-800-FDA-1088. Where should I keep my medicine? Keep out of the reach of children and pets. Store at room temperature between 15 and 30 degrees C (59 to 86 degrees F). Get rid of any unused medicine after the expiration date. To  get rid of medicines that are no longer needed or have expired:  Take the medicine to a medicine take-back program. Check with your pharmacy or law enforcement to find a location.  If you cannot return the medicine, check the label or package insert to see if the medicine should be thrown out in the garbage or flushed down the toilet. If you are not sure, ask your health care provider. If it is safe to put it in the trash, take the medicine out of the container. Mix the medicine with cat litter, dirt, coffee grounds, or other unwanted substance. Seal the mixture in a bag or container. Put it in the trash. NOTE: This sheet is a summary. It may not cover all possible information. If you have questions about this medicine, talk to your doctor, pharmacist, or health care provider.  2021 Elsevier/Gold Standard (2020-07-04 16:06:16)

## 2020-09-18 NOTE — Assessment & Plan Note (Signed)
Patient does respond fairly well to osteopathic manipulation.  Chronic problem with mild exacerbation.  Patient's x-rays were fairly unremarkable for any arthritic changes.  I do feel that patient's underlying anxiety could be playing a role.  Differential also includes potentially lumbar radiculopathy.  Patient will be started on Cymbalta and will see if we get some better relief.  Patient did try gabapentin previously and was unable to tolerate it secondary to GI irritation.  Patient is going to read all about the medication for she states and then decide if she will take it patient will follow-up with me again

## 2020-09-18 NOTE — Assessment & Plan Note (Signed)
Patient is wearing the braces.  Patient wants to avoid a formal physical therapy secondary to time and causing more anxiety at this moment.  Patient will increase activity slowly otherwise.  Declined any type of other treatment options such as injections at the moment.  Follow-up with me again 5 to 6 weeks

## 2020-09-26 ENCOUNTER — Ambulatory Visit: Payer: BC Managed Care – PPO | Admitting: Endocrinology

## 2020-10-01 ENCOUNTER — Ambulatory Visit: Payer: BC Managed Care – PPO | Admitting: Endocrinology

## 2020-10-14 ENCOUNTER — Telehealth: Payer: Self-pay | Admitting: Gastroenterology

## 2020-10-14 NOTE — Telephone Encounter (Signed)
Left message for patient to call back  

## 2020-10-14 NOTE — Telephone Encounter (Signed)
Patient under a "great deal of stress"  She had been c/o constipation and GERD.  She is taking some of her medications Linzess every other day and Miralax as needed.  She also reports she is having difficulty with indigestions and "not digesting my meds".  She will come in and see Dr. Tarri Glenn tomorrow at 2:10

## 2020-10-15 ENCOUNTER — Ambulatory Visit: Payer: BC Managed Care – PPO | Admitting: Family Medicine

## 2020-10-15 ENCOUNTER — Ambulatory Visit: Payer: BC Managed Care – PPO | Admitting: Gastroenterology

## 2020-10-15 ENCOUNTER — Encounter: Payer: Self-pay | Admitting: Gastroenterology

## 2020-10-15 ENCOUNTER — Encounter: Payer: Self-pay | Admitting: Family Medicine

## 2020-10-15 ENCOUNTER — Other Ambulatory Visit: Payer: Self-pay | Admitting: Physician Assistant

## 2020-10-15 ENCOUNTER — Other Ambulatory Visit: Payer: Self-pay

## 2020-10-15 VITALS — BP 120/90 | HR 88 | Ht 65.0 in | Wt 189.0 lb

## 2020-10-15 VITALS — BP 98/64 | HR 94 | Ht 65.0 in | Wt 189.5 lb

## 2020-10-15 DIAGNOSIS — E611 Iron deficiency: Secondary | ICD-10-CM | POA: Diagnosis not present

## 2020-10-15 DIAGNOSIS — M999 Biomechanical lesion, unspecified: Secondary | ICD-10-CM | POA: Diagnosis not present

## 2020-10-15 DIAGNOSIS — M25561 Pain in right knee: Secondary | ICD-10-CM

## 2020-10-15 DIAGNOSIS — R1010 Upper abdominal pain, unspecified: Secondary | ICD-10-CM

## 2020-10-15 DIAGNOSIS — M25562 Pain in left knee: Secondary | ICD-10-CM

## 2020-10-15 DIAGNOSIS — M542 Cervicalgia: Secondary | ICD-10-CM

## 2020-10-15 DIAGNOSIS — G8929 Other chronic pain: Secondary | ICD-10-CM

## 2020-10-15 MED ORDER — LUBIPROSTONE 24 MCG PO CAPS: 24.0000 ug | ORAL_CAPSULE | Freq: Two times a day (BID) | ORAL | 2 refills | Status: DC

## 2020-10-15 MED ORDER — PANTOPRAZOLE SODIUM 40 MG PO TBEC: 40.0000 mg | DELAYED_RELEASE_TABLET | Freq: Every morning | ORAL | 3 refills | Status: DC

## 2020-10-15 NOTE — Progress Notes (Signed)
Following message sent to Rhys Martini and April Pait:  [facility] Phone: [phone] Fax: [phone]   Patient Name: Claire Irwin DOB: March 28, 1971 MRN #: [id number]  Imaging Ordered: Abd [place]  Diagnosis: Abdominal pain  Ordering Provider: Dr. Tarri Glenn  Is a Prior Authorization needed? We are in the process of obtaining it now  Is the patient Diabetic? Yes  Does the patient have Hypertension? No  Does the patient have any implanted devices or hardware? No  Date of last BUN/Creat, if needed? N/A  Patient Weight? 189#  Is the patient able to get on the table? Yes  Has the patient been diagnosed with COVID? No  Is the patient waiting on COVID testing results? No  Thank you for your assistance! [facility]

## 2020-10-15 NOTE — Assessment & Plan Note (Signed)
Patient continues to have bilateral knee pain.  Seems to be more patellofemoral.  Patient declined physical therapy or possible injections.  Patient was to continue with conservative therapy.  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  Follow-up with me again in 2 months

## 2020-10-15 NOTE — Progress Notes (Signed)
[facility] [address] [place] Phone: [phone] Subjective:   I [name] am serving as a Education administrator for Dr. Hulan Saas.  This visit occurred during the SARS-CoV-2 public health emergency.  Safety protocols were in place, including screening questions prior to the visit, additional usage of staff PPE, and extensive cleaning of exam room while observing appropriate contact time as indicated for disinfecting solutions.   I'm seeing this patient by the request  of:  Biagio Borg, MD  CC: Back pain and knee pain follow-up  XKG:[id number]  REDITH DRACH is a 50 y.o. female coming in with complaint of back and neck pain. OMt 09/18/2020. Patient states she is doing pretty good. Knee pain on and off. Neck and back is doing well.  Patient states that the knees seem to be more painful.  Medications patient has been prescribed: Cymbalta  Taking: Not taking it at this moment.  Patient is having some GI difficulties and is following up with Dr. Tarri Glenn         Reviewed prior external information including notes and imaging from previsou exam, outside providers and external EMR if available.   As well as notes that were available from care everywhere and other healthcare systems.  Past medical history, social, surgical and family history all reviewed in electronic medical record.  No pertanent information unless stated regarding to the chief complaint.   Past Medical History:  Diagnosis Date  . Allergy   . Asthma   . Diabetes mellitus without complication ([place])   . 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 Endometriosis   . Fibrocystic breast disease   . GERD (gastroesophageal reflux disease)   . History of endometriosis   . Hyperlipidemia 06/29/2011  . Preeclampsia     Allergies  Allergen Reactions  . Hydrocodone   . Sulfonamide Derivatives      Review of Systems:  No headache, visual changes, nausea,  vomiting, diarrhea, constipation, dizziness, abdominal pain, skin rash, fevers, chills, night sweats, weight loss, swollen lymph nodes, body aches, joint swelling, chest pain, shortness of breath, mood changes. POSITIVE muscle aches  Objective  Blood pressure 120/90, pulse 88, height 5\' 5"  (1.651 m), weight 189 lb (85.7 kg), SpO2 99 %.   General: No apparent distress alert and oriented x3 mood and affect normal, dressed appropriately.  HEENT: Pupils equal, extraocular movements intact  Respiratory: Patient's speak in full sentences and does not appear short of breath  Cardiovascular: No lower extremity edema, non tender, no erythema  Patient's knees do have full range of motion.  Does have some patellar grind.  Patient does have very mild lateral tracking right greater than left.  No instability of the knee noted.  Neck exam does have significant loss of lordosis.  Some tenderness to palpation lacking the last 5 degrees of extension and left-sided sidebending.  Negative Spurling's.  Patient's low back does have some mild loss of lordosis.  Some tightness noted on range of motion testing.  Deep tendon reflexes are intact.  5 out of 5 strength in lower extremity.  Osteopathic findings  C5 flexed rotated and side bent right T3 extended rotated and side bent right inhaled rib T8 extended rotated and side bent left L2 flexed rotated and side bent right Sacrum right on right       Assessment and Plan:  Bilateral knee pain Patient continues to have bilateral knee pain.  Seems to be  more patellofemoral.  Patient declined physical therapy or possible injections.  Patient was to continue with conservative therapy.  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  Follow-up with me again in 2 months  Neck pain Chronic pain with mild exacerbation.  We did give her Cymbalta previously but patient did not take it  secondary to GI discomfort with other problems.  Patient is going to try to calm everything down and then restart the medication.  I do feel there is some underlying anxiety that is also contributing.  Patient is encouraged to talk to her primary care provider for this.  Patient will follow up with me again in 2 months    Nonallopathic problems  Decision today to treat with OMT was based on Physical Exam  After verbal consent patient was treated with HVLA, ME, FPR techniques in cervical, rib, thoracic, lumbar, and sacral  areas  Patient tolerated the procedure well with improvement in symptoms  Patient given exercises, stretches and lifestyle modifications  See medications in patient instructions if given  Patient will follow up in 4-8 weeks     The above documentation has been reviewed and is accurate and complete Lyndal Pulley, DO       Note: This dictation was prepared with Dragon dictation along with smaller phrase technology. Any transcriptional errors that result from this process are unintentional.

## 2020-10-15 NOTE — Patient Instructions (Addendum)
RECOMMENDATION(S):    Because GI symptoms are often made worse by stress, taking steps to manage stress in your life may help relieve symptoms. Some people find that relaxation techniques including breathing exercises, meditation, and yoga help reduce stress. As well, physical activity and adequate sleep can help control stress, as can any activity that you find calming - reading, listening to music, taking a walk with a friend, for example. Counseling and support from counselors, friends, family, and support groups can also help manage stress and help you find ways to avoid or navigate stressful situations.   I would like to see if FDGuard may improve some of your upper abdominal fullness. Take FDGard - 2 capsules taken twice daily for 4 weeks.  Please take FDGard 30 to 60 minutes before meals with water.  There is no distinct pattern of side effects with FDGard, but, sometimes patients experience a mild tingling sensation in the gut within the first 30 minutes. This sensation should subsequently subside.   SAMPLES:   We have given you samples of the following medication to take:  . FDGard - please take as written above  I think with improved control of your constipation, your symptoms might also improve. Let's see if you have a better response to Amitiza instead of Linzess.  PRESCRIPTION MEDICATION(S):   We have sent the following medication(s) to your pharmacy:  . Pantoprazole - Please continue to take 40mg  by mouth every morning . Amitiza - Please take 33mcg by mouth 2 times daily  NOTE: If your medication(s) requires a PRIOR AUTHORIZATION, we will receive notification from your pharmacy. Once received, the process to submit for approval may take up to 7-10 business days. You will be contacted about any denials we have received from your insurance company as well as alternatives recommended by your provider.  IMAGING:  I have recommended an abdominal ultrasound to evaluate your  pancreas and gallbladder and the tubes around them.  Dennis Bast will be contacted by [facility] (Your caller ID will indicate phone # [phone]) in the next 2 days to schedule your ABDOMINAL ULTRASOUND. If you have not heard from them within 2 business days, please call [facility] at [phone] to follow up on the status of your appointment.   ENDOSCOPY:   I am also recommending an upper endoscopy to look for gastritis, esophagitis, ulcers, tumors and infections.    . You have been scheduled for an endoscopy. Please follow written instructions given to you at your visit today.  INHALERS:   . If you use inhalers (even only as needed), please bring them with you on the day of your procedure.  BMI:  If you are age 79 or younger, your body mass index should be between 19-25. Your There is no height or weight on file to calculate BMI. If this is out of the aformentioned range listed, please consider follow up with your Primary Care Provider.   Thank you for trusting me with your gastrointestinal care!    Thornton Park, MD, MPH

## 2020-10-15 NOTE — Patient Instructions (Addendum)
Write me if you start taking Cymbalta Can consider PT or injections Continue exercises See me in 7-8 weeks

## 2020-10-15 NOTE — Progress Notes (Signed)
Referring Provider: Biagio Borg, MD Primary Care Physician:  Biagio Borg, MD  Chief complaint:  Constipation   IMPRESSION:  Upper abdominal pain Chronic constipation without alarm features    - normal TSH and calcium    - incompletely relieved Linzess, Miralax, stool softeners, milk of mangesium    - using magnesium citrate for rescue therapy    - SitzMark study showed no retained rings Iron deficiency with anemia noted on 12/21 Reflux despite multiple therapeutic attempts History of colon polyps    - 2 tubular adenomas on colonoscopy 10/11/19    - surveillance colonoscopy recommended 2028 No known family history of colon cancer or polyps  Constipation not responding to laxative therapy without alarm features: Trial of Amitiza instead of Linzess 290 mcg daily. Consider referral for pelvic floor PT, although SitzMark study showed no retained stools.   Upper abdominal pain: Maybe related to constipation, although she is unable to correlated the two. Sitz Mark showed significant stool in the right colon, although no retained rings. No recent abdominal imaging. Plan abdominal ultrasound and EGD. Trial of FDGard in the meantime.   Iron deficiency without anemia without overt GI blood loss: No obvious source on colonoscopy, Plan EGD.   Reflux: Reviewed lifestyle modifications.    History of colon polyps: Reviewed pathology results. Surveillance colonoscopy due in 7 years.    PLAN: Continue Pantoprazole 40 mg QAM Trial of FD Guard Continue Linzess 290 mcg daily Amitiza 16mg BID EGD Abdominal ultrasound  Please see the "Patient Instructions" section for addition details about the plan.  HPI: Claire FREEZEis a 50y.o. female who returns in follow-up. Initially seen in consultation for constipation 08/24/19. Had a colonoscopy 10/11/19.   Kindergarten tPharmacist, hospitalat [employer]  She has diabetes, asthma, endometriosis, and a history of a partial hysterectomy 10 years  ago.  Diagnosed with diabetes 4 years ago. Stool change followed the diagnosis.   Bowel movement 3-4 times a week.  Associated bloating.  Some straining although she tries to avoid.  Ocassionally lumpy hard stool. Sensation of incomplete evacuation. No use of digital maneuvers. No sensation of anorectal obstruction or blockage with 25 percent of bowel movements.  No blood or mucus.  No abdominal pain.   Chronic constipation despite several OTC preps including stool softeners.  History of a bowel movement 3-4 times a week.  Associated bloating.  Some straining although she tries to avoid.  Ocassionally lumpy hard stool. Sensation of incomplete evacuation. No use of digital maneuvers. No sensation of anorectal obstruction or blockage with 25 percent of bowel movements.  No blood or mucus.  Linzess increased to 290 daily with improvement in constipation but did not like the severe diarrhea. Using the Linzess on weekends to minimize symptoms.  Also has a lifetime history of reflux. Taking omeprazole, Pepcid, and then protonix as she finds that she has to keep switching them to control her symptoms.  Takes medication most days but not consistently.    Colonoscopy 10/11/19: 2 tubular adenomas, internal hmoerrhoids, left-sided diverticulosis  Sitz mark study 08/29/19: No retained marker, stool mainly in the ascending colon  Labs 03/15/19: normal TSH and calcium Labs 08/27/20: normal CBC, normal CMP except for glucose of 157, ESR 10, iron 50, ferritin 23.2  Called with escalating symptoms 10/14/20 despite Linzess QOD and Miralax PRN.   Feels like a rock or brick sitting at the upper abdomen. Associated bloating, nausea. Not sleeping due to the symptoms. She doesn't feel like her  diabetes medications are digesting. Feels like her bowel movements smell like medication and she's concerned that medication isn't going there. No identified food triggers.   Ongoing constipation despite Linzess, Miralax, stool  softeners, milk of mangesium. Incomplete evacuation. Stool has changed shape over the last few days with more narrow, ribbon like stools.  Add magnesium citrate this week for rescue therapy.   She has been under significant stress. One of her teenage twins has been diagnosed with ADHD, OCD, eating disorder. Father with glaucoma and she has to take him to [facility] appointment 3 months. 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 A new pastor at her church has also been a big adjustment.  Using some ibuprofen for jaw clenching tooth pain - not using it more than every 3 months.   Past Medical History:  Diagnosis Date  . Allergy   . Asthma   . Diabetes mellitus without complication ([place])   . 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 Endometriosis   . Fibrocystic breast disease   . GERD (gastroesophageal reflux disease)   . History of endometriosis   . Hyperlipidemia 06/29/2011  . Preeclampsia     Past Surgical History:  Procedure Laterality Date  . ABDOMINAL ADHESION SURGERY  07/2004  . CESAREAN SECTION  02/2004  . DILATION AND CURETTAGE, DIAGNOSTIC / THERAPEUTIC    . PARTIAL HYSTERECTOMY     still have ovaries    Current Outpatient Medications  Medication Sig Dispense Refill  . albuterol (VENTOLIN HFA) 108 (90 Base) MCG/ACT inhaler USE 2 PUFFS 4 TIMES DAILY AS NEEDED 8.5 g 2  . aspirin EC 81 MG tablet Take 1 tablet (81 mg total) by mouth daily. 90 tablet 11  . Calcium Carbonate Antacid (TUMS PO) Take 1 tablet by mouth as needed.    . Cholecalciferol (VITAMIN D-3) 125 MCG (5000 UT) TABS Take 1 tablet by mouth daily as needed.    . Diclofenac Sodium (PENNSAID) 2 % SOLN Apply topically as needed.    . DULoxetine (CYMBALTA) 20 MG capsule Take 1 capsule (20 mg total) by mouth daily. 30 capsule 1  . empagliflozin (JARDIANCE) 10 MG TABS tablet Take 1 tablet (10 mg total) by mouth daily. 90 tablet 3  . famotidine (PEPCID) 20 MG tablet Take 1 tablet (20 mg total) by mouth  2 (two) times daily. 180 tablet 3  . Fluticasone-Salmeterol (WIXELA INHUB) 250-50 MCG/DOSE AEPB USE 1 PUFF TWO TIMES DAILY AS NEEDED 180 each 3  . linaclotide (LINZESS) 290 MCG CAPS capsule Take 1 capsule (290 mcg total) by mouth daily before breakfast. 30 capsule 11  . loratadine (CLARITIN) 10 MG tablet Take 10 mg by mouth daily as needed for allergies.    . Multiple Vitamins-Calcium (ONE-A-DAY WOMENS PO) Take by mouth.    . norethindrone (AYGESTIN) 5 MG tablet Take 5 mg by mouth daily.  10  . norethindrone (MICRONOR) 0.35 MG tablet Take 1 tablet by mouth daily.    Claire Irwin DELICA LANCETS 29F MISC Use to monitor glucose levels qd 100 each 1  . ONETOUCH ULTRA test strip USE TO MONITOR GLUCOSE LEVELS ONCE PER DAY 50 strip 12  . pantoprazole (PROTONIX) 40 MG tablet Take 1 tablet (40 mg total) by mouth daily. 90 tablet 3  . predniSONE (DELTASONE) 20 MG tablet Take 1 tablet (20 mg total) by mouth daily with breakfast. 5 tablet 0  . SitaGLIPtin-MetFORMIN HCl (JANUMET XR) 50-1000 MG TB24 Take 1 tablet by mouth daily. [place]  tablet 3  . vitamin B-12 (CYANOCOBALAMIN) 1000 MCG tablet Take 1,000 mcg by mouth daily as needed.     No current facility-administered medications for this visit.    Allergies as of 10/15/2020 - Review Complete 09/18/2020  Allergen Reaction Noted  . Hydrocodone  02/21/2010  . Sulfonamide derivatives      Family History  Problem Relation Age of Onset  . Diabetes Mother   . Hypertension Mother   . Asthma Mother   . Allergies Mother   . Breast cancer Mother   . Diabetes Father   . Heart disease Paternal Uncle   . Asthma Maternal Grandmother   . Allergies Maternal Grandmother   . Breast cancer Maternal Grandmother   . Cancer Paternal Grandmother        Brain and Lung  . Breast cancer Maternal Aunt   . Colon cancer Neg Hx   . Esophageal cancer Neg Hx      Physical Exam: General:   Alert,  well-nourished, pleasant and cooperative in NAD Head:  Normocephalic and  atraumatic. Eyes:  Sclera clear, no icterus.   Conjunctiva pink. Abdomen:  Soft, nontender, nondistended, normal bowel sounds, no rebound or guarding. No hepatosplenomegaly.   Rectal:  Deferred  Msk:  Symmetrical. No boney deformities LAD: No inguinal or umbilical LAD Extremities:  No clubbing or edema. Neurologic:  Alert and  oriented x4;  grossly nonfocal Skin:  Intact without significant lesions or rashes. Psych:  Alert and cooperative. Normal mood and affect.   Kimberly L. Tarri Glenn, MD, MPH 10/15/2020, 2:01 PM

## 2020-10-15 NOTE — Assessment & Plan Note (Signed)
Chronic pain with mild exacerbation.  We did give her Cymbalta previously but patient did not take it secondary to GI discomfort with other problems.  Patient is going to try to calm everything down and then restart the medication.  I do feel there is some underlying anxiety that is also contributing.  Patient is encouraged to talk to her primary care provider for this.  Patient will follow up with me again in 2 months

## 2020-10-16 ENCOUNTER — Ambulatory Visit: Payer: BC Managed Care – PPO | Admitting: Gastroenterology

## 2020-10-21 NOTE — Progress Notes (Signed)
Appointment Information  Name: Claire Irwin, Claire Irwin MRN: [id number]  Date: 10/22/2020 Status: Sch  Time: 9:00 AM Length: 30  Visit Type: US ABDOMEN COMPLETE [[id number]] Copay: $0.00  Provider: [facility] Department: [facility] ULTRASOUND  Referring Provider: Thornton Park CSN: [id number]  Notes: Arr 15 mins prior @ [facility]. NPO midnight. Schd w patient. MF  Made On: 10/21/2020 8:43 AM By: Festus Holts

## 2020-10-22 ENCOUNTER — Other Ambulatory Visit: Payer: Self-pay

## 2020-10-22 ENCOUNTER — Ambulatory Visit (HOSPITAL_BASED_OUTPATIENT_CLINIC_OR_DEPARTMENT_OTHER)
Admission: RE | Admit: 2020-10-22 | Discharge: 2020-10-22 | Disposition: A | Discharge status: Home / Self Care | Payer: BC Managed Care – PPO | Source: Ambulatory Visit | Attending: Gastroenterology | Admitting: Gastroenterology

## 2020-10-22 DIAGNOSIS — E611 Iron deficiency: Secondary | ICD-10-CM | POA: Insufficient documentation

## 2020-10-22 DIAGNOSIS — R1010 Upper abdominal pain, unspecified: Secondary | ICD-10-CM | POA: Insufficient documentation

## 2020-10-30 ENCOUNTER — Other Ambulatory Visit: Payer: Self-pay | Admitting: Obstetrics and Gynecology

## 2020-10-30 DIAGNOSIS — N6310 Unspecified lump in the right breast, unspecified quadrant: Secondary | ICD-10-CM

## 2020-11-05 ENCOUNTER — Other Ambulatory Visit: Payer: Self-pay

## 2020-11-07 ENCOUNTER — Ambulatory Visit: Payer: BC Managed Care – PPO | Admitting: Endocrinology

## 2020-11-07 ENCOUNTER — Other Ambulatory Visit: Payer: Self-pay

## 2020-11-07 VITALS — BP 112/70 | HR 89 | Ht 65.0 in | Wt 190.6 lb

## 2020-11-07 DIAGNOSIS — E1165 Type 2 diabetes mellitus with hyperglycemia: Secondary | ICD-10-CM

## 2020-11-07 LAB — POCT GLYCOSYLATED HEMOGLOBIN (HGB A1C): Hemoglobin A1C: 6.5 % — AB (ref 4.0–5.6)

## 2020-11-07 MED ORDER — ACARBOSE 25 MG PO TABS: 25.0000 mg | ORAL_TABLET | Freq: Three times a day (TID) | ORAL | 11 refills | Status: DC

## 2020-11-07 NOTE — Progress Notes (Signed)
Subjective:    Patient ID: Claire Irwin, female    DOB: 10-11-1970, 50 y.o.   MRN: [id number]  HPI Pt returns for f/u of diabetes mellitus: DM type: 2 Dx'ed: 6203 Complications: CRI Therapy: 3 oral meds GDM: never DKA: never Severe hypoglycemia: never.  Pancreatitis: never.  Other: she has never taken insulin; she did not tolerate farxiga (rash).   Interval history: Pt says she takes meds as rx'ed.  pt states she feels well in general, except for constipation.    Past Medical History:  Diagnosis Date  . Allergy   . Asthma   . Diabetes mellitus without complication ([place])   . 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 Endometriosis   . Fibrocystic breast disease   . GERD (gastroesophageal reflux disease)   . History of endometriosis   . Hyperlipidemia 06/29/2011  . Preeclampsia     Past Surgical History:  Procedure Laterality Date  . ABDOMINAL ADHESION SURGERY  07/2004  . CESAREAN SECTION  02/2004  . DILATION AND CURETTAGE, DIAGNOSTIC / THERAPEUTIC    . PARTIAL HYSTERECTOMY     still have ovaries    Social History   Socioeconomic History  . Marital status: Legally Separated    Spouse name: Not on file  . Number of children: 2  . Years of education: Not on file  . Highest education level: Not on file  Occupational History  . Occupation: Pharmacist, hospital  Tobacco Use  . Smoking status: Never Smoker  . Smokeless tobacco: Never Used  Vaping Use  . Vaping Use: Never used  Substance and Sexual Activity  . Alcohol use: No  . Drug use: No  . Sexual activity: Not on file  Other Topics Concern  . Not on file  Social History Narrative  . Not on file   Social Determinants of Health   Financial Resource Strain: Not on file  Food Insecurity: Not on file  Transportation Needs: Not on file  Physical Activity: Not on file  Stress: Not on file  Social Connections: Not on file  Intimate Partner Violence: Not on file    Current Outpatient Medications on  File Prior to Visit  Medication Sig Dispense Refill  . albuterol (VENTOLIN HFA) 108 (90 Base) MCG/ACT inhaler USE 2 PUFFS 4 TIMES DAILY AS NEEDED 8.5 g 2  . aspirin EC 81 MG tablet Take 1 tablet (81 mg total) by mouth daily. 90 tablet 11  . Calcium Carbonate Antacid (TUMS PO) Take 1 tablet by mouth as needed.    . Cholecalciferol (VITAMIN D-3) 125 MCG (5000 UT) TABS Take 1 tablet by mouth daily as needed.    . Diclofenac Sodium (PENNSAID) 2 % SOLN Apply topically as needed.    Mariane Baumgarten Calcium (STOOL SOFTENER PO) Take 1 tablet by mouth as needed.    . empagliflozin (JARDIANCE) 10 MG TABS tablet Take 1 tablet (10 mg total) by mouth daily. 90 tablet 3  . famotidine (PEPCID) 20 MG tablet Take 1 tablet (20 mg total) by mouth 2 (two) times daily. 180 tablet 3  . Fluticasone-Salmeterol (WIXELA INHUB) 250-50 MCG/DOSE AEPB USE 1 PUFF TWO TIMES DAILY AS NEEDED 180 each 3  . loratadine (CLARITIN) 10 MG tablet Take 10 mg by mouth daily as needed for allergies.    Marland Kitchen lubiprostone (AMITIZA) 24 MCG capsule Take 1 capsule (24 mcg total) by mouth 2 (two) times daily. 60 capsule 2  . Multiple Vitamins-Calcium (ONE-A-DAY WOMENS PO) Take by mouth.    Marland Kitchen  norethindrone (MICRONOR) 0.35 MG tablet Take 1 tablet by mouth daily.    Glory Rosebush DELICA LANCETS 53G MISC Use to monitor glucose levels qd 100 each 1  . ONETOUCH ULTRA test strip USE TO MONITOR GLUCOSE LEVELS ONCE PER DAY 50 strip 12  . pantoprazole (PROTONIX) 40 MG tablet Take 1 tablet (40 mg total) by mouth in the morning. 90 tablet 3  . polyethylene glycol (MIRALAX / GLYCOLAX) 17 g packet Take 17 g by mouth daily as needed.    . SitaGLIPtin-MetFORMIN HCl (JANUMET XR) 50-1000 MG TB24 Take 1 tablet by mouth daily. 90 tablet 3  . vitamin B-12 (CYANOCOBALAMIN) 1000 MCG tablet Take 1,000 mcg by mouth daily as needed.     No current facility-administered medications on file prior to visit.    Allergies  Allergen Reactions  . Hydrocodone   . Sulfonamide  Derivatives     Family History  Problem Relation Age of Onset  . Diabetes Mother   . Hypertension Mother   . Asthma Mother   . Allergies Mother   . Breast cancer Mother   . Diabetes Father   . Heart disease Paternal Uncle   . Asthma Maternal Grandmother   . Allergies Maternal Grandmother   . Breast cancer Maternal Grandmother   . Cancer Paternal Grandmother        Brain and Lung  . Breast cancer Maternal Aunt   . Colon cancer Neg Hx   . Esophageal cancer Neg Hx     BP 112/70 (BP Location: Right Arm, Patient Position: Sitting, Cuff Size: Large)   Pulse 89   Ht 5\' 5"  (1.651 m)   Wt 190 lb 9.6 oz (86.5 kg)   SpO2 96%   BMI 31.72 kg/m    Review of Systems Denies n/v    Objective:   Physical Exam VITAL SIGNS:  See vs page GENERAL: no distress Pulses: dorsalis pedis intact bilat.   MSK: no deformity of the feet CV: no leg edema Skin:  no ulcer on the feet.  normal color and temp on the feet.  Neuro: sensation is intact to touch on the feet.    A1c=6.5%  Lab Results  Component Value Date   CREATININE 1.03 08/27/2020   BUN 15 08/27/2020   NA 136 08/27/2020   K 3.6 08/27/2020   CL 101 08/27/2020   CO2 30 08/27/2020      Assessment & Plan:  Type 2 DM Constipation: acarbose might help.   Patient Instructions  I have sent a prescription to your pharmacy, to add "acarbose." Please continue the same other medications. check your blood sugar once a day.  vary the time of day when you check, between before the 3 meals, and at bedtime.  also check if you have symptoms of your blood sugar being too high or too low.  please keep a record of the readings and bring it to your next appointment here (or you can bring the meter itself).  You can write it on any piece of paper.  please call us sooner if your blood sugar goes below 70, or if you have a lot of readings over 200. Please come back for a follow-up appointment in 4-6 months.

## 2020-11-07 NOTE — Patient Instructions (Addendum)
I have sent a prescription to your pharmacy, to add "acarbose." Please continue the same other medications. check your blood sugar once a day.  vary the time of day when you check, between before the 3 meals, and at bedtime.  also check if you have symptoms of your blood sugar being too high or too low.  please keep a record of the readings and bring it to your next appointment here (or you can bring the meter itself).  You can write it on any piece of paper.  please call us sooner if your blood sugar goes below 70, or if you have a lot of readings over 200. Please come back for a follow-up appointment in 4-6 months.

## 2020-11-25 ENCOUNTER — Other Ambulatory Visit: Payer: Self-pay | Admitting: Endocrinology

## 2020-12-02 ENCOUNTER — Ambulatory Visit: Payer: BC Managed Care – PPO | Admitting: Family Medicine

## 2020-12-18 ENCOUNTER — Ambulatory Visit (AMBULATORY_SURGERY_CENTER): Payer: BC Managed Care – PPO | Admitting: Gastroenterology

## 2020-12-18 ENCOUNTER — Other Ambulatory Visit: Payer: Self-pay

## 2020-12-18 ENCOUNTER — Encounter: Payer: Self-pay | Admitting: Gastroenterology

## 2020-12-18 VITALS — BP 116/75 | HR 61 | Temp 95.7°F | Resp 21 | Ht 65.0 in | Wt 189.0 lb

## 2020-12-18 DIAGNOSIS — R1010 Upper abdominal pain, unspecified: Secondary | ICD-10-CM

## 2020-12-18 DIAGNOSIS — K2100 Gastro-esophageal reflux disease with esophagitis, without bleeding: Secondary | ICD-10-CM

## 2020-12-18 DIAGNOSIS — K2950 Unspecified chronic gastritis without bleeding: Secondary | ICD-10-CM

## 2020-12-18 DIAGNOSIS — K2970 Gastritis, unspecified, without bleeding: Secondary | ICD-10-CM | POA: Diagnosis not present

## 2020-12-18 DIAGNOSIS — K219 Gastro-esophageal reflux disease without esophagitis: Secondary | ICD-10-CM

## 2020-12-18 MED ORDER — SODIUM CHLORIDE 0.9 % IV SOLN: 500.0000 mL | Freq: Once | INTRAVENOUS | Status: DC

## 2020-12-18 NOTE — Progress Notes (Signed)
A and O x3. Report to RN. Tolerated MAC anesthesia well.Teeth unchanged after procedure.

## 2020-12-18 NOTE — Progress Notes (Signed)
Called to room to assist during endoscopic procedure.  Patient ID and intended procedure confirmed with present staff. Received instructions for my participation in the procedure from the performing physician.  

## 2020-12-18 NOTE — Patient Instructions (Signed)
HANDOUTS PROVIDED ON: GASTRITIS  The biopsies taken today have been sent for pathology.  The results can take 1-3 weeks to receive.  When your next colonoscopy should occur will be based on the pathology results.    You may resume your previous diet and medication schedule.  NO ASPIRIN, IBUPROFEN, NAPROXEN, OR OTHER NSAIDs.  Thank you for allowing [place] to care for you today!!!   YOU HAD AN ENDOSCOPIC PROCEDURE TODAY AT [facility]:   Refer to the procedure report that was given to you for any specific questions about what was found during the examination.  If the procedure report does not answer your questions, please call your gastroenterologist to clarify.  If you requested that your care partner not be given the details of your procedure findings, then the procedure report has been included in a sealed envelope for you to review at your convenience later.  YOU SHOULD EXPECT: Some feelings of bloating in the abdomen. Passage of more gas than usual.  Walking can help get rid of the air that was put into your GI tract during the procedure and reduce the bloating.  Please Note:  You might notice some irritation and congestion in your nose or some drainage.  This is from the oxygen used during your procedure.  There is no need for concern and it should clear up in a day or so.  SYMPTOMS TO REPORT IMMEDIATELY:   Following upper endoscopy (EGD)  Vomiting of blood or coffee ground material  New chest pain or pain under the shoulder blades  Painful or persistently difficult swallowing  New shortness of breath  Fever of 100F or higher  Black, tarry-looking stools  For urgent or emergent issues, a gastroenterologist can be reached at any hour by calling [phone]. Do not use MyChart messaging for urgent concerns.    DIET:  We do recommend a small meal at first, but then you may proceed to your regular diet.  Drink plenty of fluids but you should avoid alcoholic beverages  for 24 hours.  ACTIVITY:  You should plan to take it easy for the rest of today and you should NOT DRIVE or use heavy machinery until tomorrow (because of the sedation medicines used during the test).    FOLLOW UP: Our staff will call the number listed on your records Friday morning between 7:15 am and 8:15 am following your procedure to check on you and address any questions or concerns that you may have regarding the information given to you following your procedure. If we do not reach you, we will leave a message.  We will attempt to reach you two times.  During this call, we will ask if you have developed any symptoms of COVID 19. If you develop any symptoms (ie: fever, flu-like symptoms, shortness of breath, cough etc.) before then, please call [phone].  If you test positive for Covid 19 in the 2 weeks post procedure, please call and report this information to [place].    If any biopsies were taken you will be contacted by phone or by letter within the next 1-3 weeks.  Please call us at [phone] if you have not heard about the biopsies in 3 weeks.    SIGNATURES/CONFIDENTIALITY: You and/or your care partner have signed paperwork which will be entered into your electronic medical record.  These signatures attest to the fact that that the information above on your After Visit Summary has been reviewed and is understood.  Full  responsibility of the confidentiality of this discharge information lies with you and/or your care-partner.

## 2020-12-18 NOTE — Progress Notes (Signed)
Pt. Reports epigastric burning, Pt. Reports she has had it for years.

## 2020-12-18 NOTE — Op Note (Signed)
[facility] Patient Name: Claire Irwin Procedure Date: 12/18/2020 9:07 AM MRN: [id number] Endoscopist: Thornton Park MD, MD Age: 50 Referring MD:  Date of Birth: [birth year] Gender: Female Account #: [id number] Procedure:                Upper GI endoscopy Indications:              Upper abdominal pain, Esophageal reflux symptoms                            that persist despite appropriate therapy, iron                            deficiency on 12/21 labsUpper abdominal pain Medicines:                Monitored Anesthesia Care Procedure:                Pre-Anesthesia Assessment:                           - Prior to the procedure, a History and Physical                            was performed, and patient medications and                            allergies were reviewed. The patient's tolerance of                            previous anesthesia was also reviewed. The risks                            and benefits of the procedure and the sedation                            options and risks were discussed with the patient.                            All questions were answered, and informed consent                            was obtained. Prior Anticoagulants: The patient has                            taken no previous anticoagulant or antiplatelet                            agents. ASA Grade Assessment: II - A patient with                            mild systemic disease. After reviewing the risks                            and benefits, the patient was deemed in  satisfactory condition to undergo the procedure.                           After obtaining informed consent, the endoscope was                            passed under direct vision. Throughout the                            procedure, the patient's blood pressure, pulse, and                            oxygen saturations were monitored continuously. The                             Endoscope was introduced through the mouth, and                            advanced to the third part of duodenum. The upper                            GI endoscopy was accomplished without difficulty.                            The patient tolerated the procedure well. Scope In: Scope Out: Findings:                 LA Grade A (one or more mucosal breaks less than 5                            mm, not extending between tops of 2 mucosal folds)                            esophagitis with no bleeding was found 36 cm from                            the incisors. Biopsies were taken with a cold                            forceps for histology. Estimated blood loss was                            minimal.                           The remainder of the examined esophagus was normal.                            Biopsies were obtained from the proximal and distal                            esophagus with cold forceps for histology of  suspected eosinophilic esophagitis.                           Localized mild inflammation characterized by                            erythema, friability and granularity was found in                            the gastric antrum. Biopsies were taken from the                            antrum, body, and fundus with a cold forceps for                            histology. Estimated blood loss was minimal.                           The examined duodenum was normal. Biopsies were                            taken with a cold forceps for histology. Estimated                            blood loss was minimal.                           The cardia and gastric fundus were normal on                            retroflexion.                           The exam was otherwise without abnormality. Complications:            No immediate complications. Estimated blood loss:                            Minimal. Estimated Blood Loss:     Estimated blood loss was  minimal. Impression:               - LA Grade A reflux esophagitis with no bleeding.                            Biopsied.                           - Esophageal biopsies obtained for eosinophilic                            esophagitis.                           - Gastritis. Biopsied.                           - Normal examined duodenum. Biopsied.                           -  The examination was otherwise normal. Recommendation:           - Patient has a contact number available for                            emergencies. The signs and symptoms of potential                            delayed complications were discussed with the                            patient. Return to normal activities tomorrow.                            Written discharge instructions were provided to the                            patient.                           - Resume previous diet.                           - Continue present medications.                           - Await pathology results.                           - No aspirin, ibuprofen, naproxen, or other                            non-steroidal anti-inflammatory drugs. Thornton Park MD, MD 12/18/2020 9:29:50 AM This report has been signed electronically.

## 2020-12-20 ENCOUNTER — Telehealth: Payer: Self-pay | Admitting: Endocrinology

## 2020-12-20 ENCOUNTER — Telehealth: Payer: Self-pay | Admitting: *Deleted

## 2020-12-20 NOTE — Telephone Encounter (Signed)
Please advise 

## 2020-12-20 NOTE — Telephone Encounter (Signed)
Patient called to advise that Janumet is bothering her stomach.  She is having stomach inflammation and burning.sensation.  Patient had an upper endoscopy this past Wednesday and was advise that she does have stomach inflammation.  She is gong reduce Janumet in half and monitor her BS to see if this helps her stomach.  Phone # [phone]

## 2020-12-20 NOTE — Telephone Encounter (Signed)
Spoke pt regarding Dr.Kumar instructions.

## 2020-12-20 NOTE — Telephone Encounter (Signed)
  Follow up Call-  Call back number 12/18/2020 10/11/2019  Post procedure Call Back phone  # [phone]  Permission to leave phone message Yes Yes  Some recent data might be hidden     Patient questions:  Do you have a fever, pain , or abdominal swelling? No. Pain Score  0 *  Have you tolerated food without any problems? Yes.    Have you been able to return to your normal activities? Yes.    Do you have any questions about your discharge instructions: Diet   No. Medications  No. Follow up visit  No.  Do you have questions or concerns about your Care? No.  Actions: * If pain score is 4 or above: No action needed, pain <4.  1. Have you developed a fever since your procedure? no  2.   Have you had an respiratory symptoms (SOB or cough) since your procedure? no  3.   Have you tested positive for COVID 19 since your procedure no  4.   Have you had any family members/close contacts diagnosed with the COVID 19 since your procedure?  no   If yes to any of these questions please route to Joylene John, RN and Joella Prince, RN

## 2020-12-20 NOTE — Telephone Encounter (Signed)
She cannot cut the Janumet in half since it is extended release.  Recommend that she take only Januvia 100 mg daily and not Janumet along with her Jardiance until seen by Dr. Loanne Drilling

## 2020-12-27 ENCOUNTER — Encounter: Payer: Self-pay | Admitting: Gastroenterology

## 2020-12-27 ENCOUNTER — Other Ambulatory Visit: Payer: Self-pay

## 2020-12-27 MED ORDER — PANTOPRAZOLE SODIUM 40 MG PO TBEC: 40.0000 mg | DELAYED_RELEASE_TABLET | Freq: Two times a day (BID) | ORAL | 3 refills | Status: DC

## 2021-02-25 LAB — HM DIABETES EYE EXAM

## 2021-02-27 ENCOUNTER — Ambulatory Visit: Payer: BC Managed Care – PPO | Admitting: Podiatry

## 2021-02-27 ENCOUNTER — Other Ambulatory Visit: Payer: Self-pay

## 2021-02-27 ENCOUNTER — Encounter: Payer: Self-pay | Admitting: Podiatry

## 2021-02-27 DIAGNOSIS — M7752 Other enthesopathy of left foot: Secondary | ICD-10-CM | POA: Diagnosis not present

## 2021-02-27 DIAGNOSIS — M722 Plantar fascial fibromatosis: Secondary | ICD-10-CM

## 2021-02-27 MED ORDER — TRIAMCINOLONE ACETONIDE 10 MG/ML IJ SUSP
20.0000 mg | Freq: Once | INTRAMUSCULAR | Status: AC
  Administered 2021-02-27: 20 mg

## 2021-02-27 NOTE — Progress Notes (Signed)
Subjective:   Patient ID: Claire Irwin, female   DOB: 50 y.o.   MRN: [id number]   HPI Patient presents stating over the last couple months he has had a reoccurrence of heel pain and pain in her ankle left.  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

## 2021-03-26 ENCOUNTER — Other Ambulatory Visit: Payer: Self-pay | Admitting: Gastroenterology

## 2021-03-27 ENCOUNTER — Other Ambulatory Visit: Payer: Self-pay

## 2021-03-27 ENCOUNTER — Ambulatory Visit: Payer: BC Managed Care – PPO | Admitting: Endocrinology

## 2021-03-27 VITALS — BP 100/74 | HR 90 | Ht 65.0 in | Wt 193.8 lb

## 2021-03-27 DIAGNOSIS — E1165 Type 2 diabetes mellitus with hyperglycemia: Secondary | ICD-10-CM | POA: Diagnosis not present

## 2021-03-27 LAB — POCT GLYCOSYLATED HEMOGLOBIN (HGB A1C): Hemoglobin A1C: 6.8 % — AB (ref 4.0–5.6)

## 2021-03-27 MED ORDER — SITAGLIPTIN PHOSPHATE 100 MG PO TABS
100.0000 mg | ORAL_TABLET | Freq: Every day | ORAL | 3 refills | Status: DC
Start: 2021-03-27 — End: 2021-12-04

## 2021-03-27 MED ORDER — METFORMIN HCL ER 500 MG PO TB24: 500.0000 mg | ORAL_TABLET | Freq: Every day | ORAL | 3 refills | Status: DC

## 2021-03-27 NOTE — Progress Notes (Signed)
Subjective:    Patient ID: Claire Irwin, female    DOB: April 23, 1971, 50 y.o.   MRN: [id number]  HPI Pt returns for f/u of diabetes mellitus: DM type: 2 Dx'ed: 0000000 Complications: CRI Therapy: 3 oral meds GDM: never DKA: never Severe hypoglycemia: never.  Pancreatitis: never.  Other: she has never taken insulin; she did not tolerate farxiga (rash).   Interval history: Pt says she reduced Janumet to 1/2 tab qd, due to abd pain.  She takes acarbose intermittently.   Past Medical History:  Diagnosis Date   Allergy    Asthma    Diabetes mellitus without complication (HCC)    DJD (degenerative joint disease) of knee    LEFT   Endometriosis    Fibrocystic breast disease    GERD (gastroesophageal reflux disease)    History of endometriosis    Hyperlipidemia 06/29/2011   Preeclampsia     Past Surgical History:  Procedure Laterality Date   ABDOMINAL ADHESION SURGERY  07/2004   CESAREAN SECTION  02/2004   DILATION AND CURETTAGE, DIAGNOSTIC / THERAPEUTIC     PARTIAL HYSTERECTOMY     still have ovaries    Social History   Socioeconomic History   Marital status: Legally Separated    Spouse name: Not on file   Number of children: 2   Years of education: Not on file   Highest education level: Not on file  Occupational History   Occupation: Pharmacist, hospital  Tobacco Use   Smoking status: Never   Smokeless tobacco: Never  Vaping Use   Vaping Use: Never used  Substance and Sexual Activity   Alcohol use: No   Drug use: No   Sexual activity: Not on file  Other Topics Concern   Not on file  Social History Narrative   Not on file   Social Determinants of Health   Financial Resource Strain: Not on file  Food Insecurity: Not on file  Transportation Needs: Not on file  Physical Activity: Not on file  Stress: Not on file  Social Connections: Not on file  Intimate Partner Violence: Not on file    Current Outpatient Medications on File Prior to Visit  Medication Sig  Dispense Refill   acarbose (PRECOSE) 25 MG tablet Take 1 tablet (25 mg total) by mouth 3 (three) times daily with meals. 90 tablet 11   albuterol (VENTOLIN HFA) 108 (90 Base) MCG/ACT inhaler USE 2 PUFFS 4 TIMES DAILY AS NEEDED 8.5 g 2   aspirin EC 81 MG tablet Take 1 tablet (81 mg total) by mouth daily. 90 tablet 11   Calcium Carbonate Antacid (TUMS PO) Take 1 tablet by mouth as needed.     Cholecalciferol (VITAMIN D-3) 125 MCG (5000 UT) TABS Take 1 tablet by mouth daily as needed.     Diclofenac Sodium (PENNSAID) 2 % SOLN Apply topically as needed.     Docusate Calcium (STOOL SOFTENER PO) Take 1 tablet by mouth as needed.     famotidine (PEPCID) 20 MG tablet Take 1 tablet (20 mg total) by mouth 2 (two) times daily. 180 tablet 3   Fluticasone-Salmeterol (WIXELA INHUB) 250-50 MCG/DOSE AEPB USE 1 PUFF TWO TIMES DAILY AS NEEDED 180 each 3   JARDIANCE 10 MG TABS tablet TAKE 1/2 TABLET ('5MG'$ ) BY MOUTH DAILY 45 tablet 3   LINZESS 290 MCG CAPS capsule Take 290 mcg by mouth daily.     loratadine (CLARITIN) 10 MG tablet Take 10 mg by mouth daily as needed for allergies.  lubiprostone (AMITIZA) 24 MCG capsule Take 1 capsule (24 mcg total) by mouth 2 (two) times daily. 60 capsule 2   Multiple Vitamins-Calcium (ONE-A-DAY WOMENS PO) Take by mouth.     norethindrone (MICRONOR) 0.35 MG tablet Take 1 tablet by mouth daily.     ONETOUCH ULTRA test strip USE TO MONITOR GLUCOSE LEVELS ONCE PER DAY 50 strip 12   pantoprazole (PROTONIX) 40 MG tablet TAKE 1 TABLET BY MOUTH TWICE A DAY 180 tablet 1   vitamin B-12 (CYANOCOBALAMIN) 1000 MCG tablet Take 1,000 mcg by mouth daily as needed.     No current facility-administered medications on file prior to visit.    Allergies  Allergen Reactions   Hydrocodone    Sulfonamide Derivatives     Family History  Problem Relation Age of Onset   Diabetes Mother    Hypertension Mother    Asthma Mother    Allergies Mother    Breast cancer Mother    Diabetes Father     Heart disease Paternal Uncle    Asthma Maternal Grandmother    Allergies Maternal Grandmother    Breast cancer Maternal Grandmother    Cancer Paternal Grandmother        Brain and Lung   Breast cancer Maternal Aunt    Colon cancer Neg Hx    Esophageal cancer Neg Hx    Stomach cancer Neg Hx    Rectal cancer Neg Hx     BP 100/74 (BP Location: Right Arm, Patient Position: Sitting, Cuff Size: Large)   Pulse 90   Ht '5\' 5"'$  (1.651 m)   Wt 193 lb 12.8 oz (87.9 kg)   SpO2 98%   BMI 32.25 kg/m    Review of Systems     Objective:   Physical Exam Pulses: dorsalis pedis intact bilat.   MSK: no deformity of the feet CV: no leg edema Skin:  no ulcer on the feet.  normal color and temp on the feet. Neuro: sensation is intact to touch on the feet.     Lab Results  Component Value Date   HGBA1C 6.8 (A) 03/27/2021   Lab Results  Component Value Date   CREATININE 1.03 08/27/2020   BUN 15 08/27/2020   NA 136 08/27/2020   K 3.6 08/27/2020   CL 101 08/27/2020   CO2 30 08/27/2020      Assessment & Plan:  Type 2 DM: well-controlled Abd pain: due to metformin  Patient Instructions  I have sent a prescription to your pharmacy, to change Janumet to 2 different pills.   Please continue the same other medications.  check your blood sugar once a day.  vary the time of day when you check, between before the 3 meals, and at bedtime.  also check if you have symptoms of your blood sugar being too high or too low.  please keep a record of the readings and bring it to your next appointment here (or you can bring the meter itself).  You can write it on any piece of paper.  please call us sooner if your blood sugar goes below 70, or if you have a lot of readings over 200.   Please come back for a follow-up appointment in 4-6 months.

## 2021-03-27 NOTE — Patient Instructions (Addendum)
I have sent a prescription to your pharmacy, to change Janumet to 2 different pills.   Please continue the same other medications.  check your blood sugar once a day.  vary the time of day when you check, between before the 3 meals, and at bedtime.  also check if you have symptoms of your blood sugar being too high or too low.  please keep a record of the readings and bring it to your next appointment here (or you can bring the meter itself).  You can write it on any piece of paper.  please call us sooner if your blood sugar goes below 70, or if you have a lot of readings over 200.   Please come back for a follow-up appointment in 4-6 months.

## 2021-05-13 ENCOUNTER — Other Ambulatory Visit: Payer: Self-pay | Admitting: Endocrinology

## 2021-05-13 ENCOUNTER — Telehealth: Payer: Self-pay

## 2021-05-13 MED ORDER — EMPAGLIFLOZIN 10 MG PO TABS: 10.0000 mg | ORAL_TABLET | Freq: Every day | ORAL | 3 refills | Status: DC

## 2021-05-13 NOTE — Telephone Encounter (Signed)
JARDIANCE 10 MG TABS tablet, prescribed to take 1/2 tablet, has been taking whole tablet ('10mg'$ ), would like to continue taking '10mg'$  (whole tablet) daily.  Would like for you to change prescription to continue taking '10mg'$  daily. Any questions please call patient. [phone].

## 2021-05-29 ENCOUNTER — Ambulatory Visit: Payer: BC Managed Care – PPO | Admitting: Sports Medicine

## 2021-05-29 ENCOUNTER — Ambulatory Visit: Payer: Self-pay

## 2021-05-29 ENCOUNTER — Other Ambulatory Visit: Payer: Self-pay

## 2021-05-29 VITALS — BP 130/80 | HR 117 | Ht 65.0 in | Wt 197.0 lb

## 2021-05-29 DIAGNOSIS — M222X2 Patellofemoral disorders, left knee: Secondary | ICD-10-CM

## 2021-05-29 DIAGNOSIS — M25562 Pain in left knee: Secondary | ICD-10-CM

## 2021-05-29 MED ORDER — MELOXICAM 15 MG PO TABS
15.0000 mg | ORAL_TABLET | Freq: Every day | ORAL | 0 refills | Status: DC
Start: 2021-05-29 — End: 2021-12-04

## 2021-05-29 NOTE — Progress Notes (Signed)
Claire Irwin D.[place] Phone: [phone]   Assessment and Plan:     1. Acute pain of left knee 2. Patellofemoral pain syndrome of left knee -Acute, unchanged, initial sports medicine visit - Likely patellofemoral syndrome causing flare of pain without MOI, largely unremarkable physical exam - Start HEP for patellofemoral syndrome - Start meloxicam 15 mg daily x2 weeks and can use remaining as needed for pain control - Start Tylenol as needed for breakthrough pain - [place] LIMITED JOINT SPACE STRUCTURES LOW LEFT(NO LINKED CHARGES); Future  Pertinent previous records reviewed include PCP note, knee x-ray, lab work including a CBC/CMP   Follow Up: In 2 to 4 weeks for reevaluation.  Would consider x-ray and ultrasound at that time.  Could consider formal PT versus CSI   Subjective:   I, Claire Irwin, am serving as a scribe for Dr. Glennon Mac  Chief Complaint: Left knee pain  HPI:   05/29/21 Patient states that she has been having L knee pain for about 3 weeks. Patient has been having muscle spasms and pulling behind her knee. To stand up from sitting she has to hold on to something to help lighten the weight on that knee. Once she starts walking the pain is stiff but after a bit it loosens up. Patient has a knee brace she wears but it is only helping a little bit. Patent states that the pain will radiate to the front of the knee and up the lateral left leg.   Radiates:  yes  Mechanical symptoms: slightly  Numbness/tingling: no Weakness: yes Aggravates: sitting to standing position, up and down steps Treatments tried: brace, boot at night, pennsaid, tylenol, ice,    Relevant Historical Information: has plantar fasciitis   Additional pertinent review of systems negative.   Current Outpatient Medications:    acarbose (PRECOSE) 25 MG tablet, Take 1 tablet (25 mg total) by mouth 3 (three) times  daily with meals., Disp: 90 tablet, Rfl: 11   albuterol (VENTOLIN HFA) 108 (90 Base) MCG/ACT inhaler, USE 2 PUFFS 4 TIMES DAILY AS NEEDED, Disp: 8.5 g, Rfl: 2   aspirin EC 81 MG tablet, Take 1 tablet (81 mg total) by mouth daily., Disp: 90 tablet, Rfl: 11   Calcium Carbonate Antacid (TUMS PO), Take 1 tablet by mouth as needed., Disp: , Rfl:    Cholecalciferol (VITAMIN D-3) 125 MCG (5000 UT) TABS, Take 1 tablet by mouth daily as needed., Disp: , Rfl:    Diclofenac Sodium (PENNSAID) 2 % SOLN, Apply topically as needed., Disp: , Rfl:    Docusate Calcium (STOOL SOFTENER PO), Take 1 tablet by mouth as needed., Disp: , Rfl:    empagliflozin (JARDIANCE) 10 MG TABS tablet, Take 1 tablet (10 mg total) by mouth daily., Disp: 90 tablet, Rfl: 3   famotidine (PEPCID) 20 MG tablet, Take 1 tablet (20 mg total) by mouth 2 (two) times daily., Disp: 180 tablet, Rfl: 3   Fluticasone-Salmeterol (WIXELA INHUB) 250-50 MCG/DOSE AEPB, USE 1 PUFF TWO TIMES DAILY AS NEEDED, Disp: 180 each, Rfl: 3   LINZESS 290 MCG CAPS capsule, Take 290 mcg by mouth daily., Disp: , Rfl:    loratadine (CLARITIN) 10 MG tablet, Take 10 mg by mouth daily as needed for allergies., Disp: , Rfl:    lubiprostone (AMITIZA) 24 MCG capsule, Take 1 capsule (24 mcg total) by mouth 2 (two) times daily., Disp: 60 capsule, Rfl: 2   meloxicam (MOBIC) 15  MG tablet, Take 1 tablet (15 mg total) by mouth daily., Disp: 30 tablet, Rfl: 0   metFORMIN (GLUCOPHAGE-XR) 500 MG 24 hr tablet, Take 1 tablet (500 mg total) by mouth daily., Disp: 90 tablet, Rfl: 3   Multiple Vitamins-Calcium (ONE-A-DAY WOMENS PO), Take by mouth., Disp: , Rfl:    norethindrone (MICRONOR) 0.35 MG tablet, Take 1 tablet by mouth daily., Disp: , Rfl:    ONETOUCH ULTRA test strip, USE TO MONITOR GLUCOSE LEVELS ONCE PER DAY, Disp: 50 strip, Rfl: 12   pantoprazole (PROTONIX) 40 MG tablet, TAKE 1 TABLET BY MOUTH TWICE A DAY, Disp: 180 tablet, Rfl: 1   sitaGLIPtin (JANUVIA) 100 MG tablet, Take 1  tablet (100 mg total) by mouth daily., Disp: 90 tablet, Rfl: 3   vitamin B-12 (CYANOCOBALAMIN) 1000 MCG tablet, Take 1,000 mcg by mouth daily as needed., Disp: , Rfl:    Objective:     Vitals:   05/29/21 1409  BP: 130/80  Pulse: (!) 117  SpO2: 98%  Weight: 197 lb (89.4 kg)  Height: 5\' 5"  (1.651 m)      Body mass index is 32.78 kg/m.    Physical Exam:    General:  awake, alert oriented, no acute distress nontoxic Skin: no suspicious lesions or rashes Neuro:sensation intact, no deficits, strength 5/5 with no deficits, no atrophy, normal muscle tone Psych: No signs of anxiety, depression or other mood disorder  Knee: No swelling No deformity Neg fluid wave, joint milking ROM Flex 100, Ext 5 TTP medial lateral joint line, patella, patellar tendon NTTP over the quad tendon, tibial tuberostiy, fibular head, posterior fossa, pes anserine bursa,  Neg anterior and posterior drawer Neg lachman Neg sag sign Negative varus stress Negative valgus stress Negative McMurray Negative [place]ssaly  Gait normal    Electronically signed by:  Claire Irwin D.[facility] 2:36 PM 05/29/21

## 2021-05-29 NOTE — Patient Instructions (Addendum)
Good to see you  Meloxicam 15mg  daily for 2 weeks remaining as needed Tylenol for break through pain  Patellofemoral exercises given  See me again in 2-4 weeks

## 2021-06-04 ENCOUNTER — Ambulatory Visit: Payer: BC Managed Care – PPO | Admitting: Sports Medicine

## 2021-06-10 ENCOUNTER — Ambulatory Visit: Payer: BC Managed Care – PPO | Admitting: Sports Medicine

## 2021-07-16 ENCOUNTER — Ambulatory Visit: Payer: Self-pay

## 2021-07-18 ENCOUNTER — Other Ambulatory Visit: Payer: Self-pay

## 2021-07-18 ENCOUNTER — Ambulatory Visit (INDEPENDENT_AMBULATORY_CARE_PROVIDER_SITE_OTHER): Payer: BC Managed Care – PPO | Admitting: Internal Medicine

## 2021-07-18 ENCOUNTER — Encounter: Payer: Self-pay | Admitting: Internal Medicine

## 2021-07-18 VITALS — BP 112/70 | HR 99 | Temp 99.2°F | Ht 65.0 in | Wt 196.0 lb

## 2021-07-18 DIAGNOSIS — G5602 Carpal tunnel syndrome, left upper limb: Secondary | ICD-10-CM | POA: Diagnosis not present

## 2021-07-18 DIAGNOSIS — J4520 Mild intermittent asthma, uncomplicated: Secondary | ICD-10-CM

## 2021-07-18 DIAGNOSIS — E1165 Type 2 diabetes mellitus with hyperglycemia: Secondary | ICD-10-CM

## 2021-07-18 DIAGNOSIS — J309 Allergic rhinitis, unspecified: Secondary | ICD-10-CM | POA: Diagnosis not present

## 2021-07-18 DIAGNOSIS — E785 Hyperlipidemia, unspecified: Secondary | ICD-10-CM

## 2021-07-18 DIAGNOSIS — M549 Dorsalgia, unspecified: Secondary | ICD-10-CM

## 2021-07-18 DIAGNOSIS — Z1159 Encounter for screening for other viral diseases: Secondary | ICD-10-CM

## 2021-07-18 DIAGNOSIS — Z0001 Encounter for general adult medical examination with abnormal findings: Secondary | ICD-10-CM

## 2021-07-18 MED ORDER — METHOCARBAMOL 500 MG PO TABS: 500.0000 mg | ORAL_TABLET | Freq: Four times a day (QID) | ORAL | 3 refills | Status: DC | PRN

## 2021-07-18 NOTE — Progress Notes (Signed)
Patient ID: Claire Irwin, female   DOB: 1971/04/07, 50 y.o.   MRN: [id number]         Chief Complaint:: wellness exam and Office Visit (Headaches/upper body muscle tension)  , allergies, dm, hld, asthma       HPI:  Claire Irwin is a 50 y.o. female here for wellness exam; pt plans to f/u with GYN on her own, declines covid booster, pneumovax and flu shot for now, o/w up to date except for hep c screen with labs                         Also cont's to follow with endocrinology with fair sugar control,  Pt denies chest pain, increased sob or doe, wheezing, orthopnea, PND, increased LE swelling, palpitations, dizziness or syncope.   Pt denies polydipsia, polyuria, or new focal neuro s/s.   Pt denies fever, wt loss, night sweats, loss of appetite, or other constitutional symptoms  Also c/o bilat post neck and upper back pain and soreness, mild to mod, daily intermittent for several 2-3 wks, gradually worsening, worse to get up in the AM, better with robaxin but now out and asks for refill, but not associated with UE radicular symptoms.   Also has mild left hand tingling numbess in the AM most days of the past few weeks, without pain or weakness of the hand, tends to sleep on her left side most nights.  Has a right wrist splint but states will get a left one as well.  Also Does have several wks ongoing nasal allergy symptoms with clearish congestion, itch and sneezing, without fever, pain, ST, cough, swelling or wheezing., has tried multiple anihistamines and nasal steroid with variable success, now asks for allergy referral.   Wt Readings from Last 3 Encounters:  07/18/21 196 lb (88.9 kg)  05/29/21 197 lb (89.4 kg)  03/27/21 193 lb 12.8 oz (87.9 kg)   BP Readings from Last 3 Encounters:  07/18/21 112/70  05/29/21 130/80  03/27/21 100/74   Immunization History  Administered Date(s) Administered   Influenza Whole 07/12/2007   Influenza,inj,Quad PF,6+ Mos 07/01/2016, 08/04/2018   MMR 01/12/2000    OPV 11/03/1971, 12/11/1971, 01/12/1972, 02/22/1973, 03/18/1977   PFIZER Comirnaty(Gray Top)Covid-19 Tri-Sucrose Vaccine 03/10/2021   PFIZER(Purple Top)SARS-COV-2 Vaccination 11/02/2019, 11/24/2019, 07/20/2020   Td 01/05/2000   Tdap 11/22/2012   Health Maintenance Due  Topic Date Due   Hepatitis C Screening  Never done   URINE MICROALBUMIN  03/12/2019      Past Medical History:  Diagnosis Date   Allergy    Asthma    Diabetes mellitus without complication (HCC)    DJD (degenerative joint disease) of knee    LEFT   Endometriosis    Fibrocystic breast disease    GERD (gastroesophageal reflux disease)    History of endometriosis    Hyperlipidemia 06/29/2011   Preeclampsia    Past Surgical History:  Procedure Laterality Date   ABDOMINAL ADHESION SURGERY  07/2004   CESAREAN SECTION  02/2004   DILATION AND CURETTAGE, DIAGNOSTIC / THERAPEUTIC     PARTIAL HYSTERECTOMY     still have ovaries    reports that she has never smoked. She has never used smokeless tobacco. She reports that she does not drink alcohol and does not use drugs. family history includes Allergies in her maternal grandmother and mother; Asthma in her maternal grandmother and mother; Breast cancer in her maternal aunt, maternal grandmother, and mother; Cancer  in her paternal grandmother; Diabetes in her father and mother; Heart disease in her paternal uncle; Hypertension in her mother. Allergies  Allergen Reactions   Hydrocodone    Sulfonamide Derivatives    Current Outpatient Medications on File Prior to Visit  Medication Sig Dispense Refill   acarbose (PRECOSE) 25 MG tablet Take 1 tablet (25 mg total) by mouth 3 (three) times daily with meals. 90 tablet 11   albuterol (VENTOLIN HFA) 108 (90 Base) MCG/ACT inhaler USE 2 PUFFS 4 TIMES DAILY AS NEEDED 8.5 g 2   aspirin EC 81 MG tablet Take 1 tablet (81 mg total) by mouth daily. 90 tablet 11   Calcium Carbonate Antacid (TUMS PO) Take 1 tablet by mouth as needed.      Cholecalciferol (VITAMIN D-3) 125 MCG (5000 UT) TABS Take 1 tablet by mouth daily as needed.     Diclofenac Sodium (PENNSAID) 2 % SOLN Apply topically as needed.     Docusate Calcium (STOOL SOFTENER PO) Take 1 tablet by mouth as needed.     empagliflozin (JARDIANCE) 10 MG TABS tablet Take 1 tablet (10 mg total) by mouth daily. 90 tablet 3   famotidine (PEPCID) 20 MG tablet Take 1 tablet (20 mg total) by mouth 2 (two) times daily. 180 tablet 3   Fluticasone-Salmeterol (WIXELA INHUB) 250-50 MCG/DOSE AEPB USE 1 PUFF TWO TIMES DAILY AS NEEDED 180 each 3   LINZESS 290 MCG CAPS capsule Take 290 mcg by mouth daily.     loratadine (CLARITIN) 10 MG tablet Take 10 mg by mouth daily as needed for allergies.     lubiprostone (AMITIZA) 24 MCG capsule Take 1 capsule (24 mcg total) by mouth 2 (two) times daily. 60 capsule 2   meloxicam (MOBIC) 15 MG tablet Take 1 tablet (15 mg total) by mouth daily. 30 tablet 0   metFORMIN (GLUCOPHAGE-XR) 500 MG 24 hr tablet Take 1 tablet (500 mg total) by mouth daily. 90 tablet 3   Multiple Vitamins-Calcium (ONE-A-DAY WOMENS PO) Take by mouth.     norethindrone (MICRONOR) 0.35 MG tablet Take 1 tablet by mouth daily.     ONETOUCH ULTRA test strip USE TO MONITOR GLUCOSE LEVELS ONCE PER DAY 50 strip 12   pantoprazole (PROTONIX) 40 MG tablet TAKE 1 TABLET BY MOUTH TWICE A DAY 180 tablet 1   sitaGLIPtin (JANUVIA) 100 MG tablet Take 1 tablet (100 mg total) by mouth daily. 90 tablet 3   vitamin B-12 (CYANOCOBALAMIN) 1000 MCG tablet Take 1,000 mcg by mouth daily as needed.     No current facility-administered medications on file prior to visit.        ROS:  All others reviewed and negative.  Objective        PE:  BP 112/70 (BP Location: Right Arm, Patient Position: Sitting, Cuff Size: Large)   Pulse 99   Temp 99.2 F (37.3 C) (Oral)   Ht $R'5\' 5"'WL$  (1.651 m)   Wt 196 lb (88.9 kg)   SpO2 98%   BMI 32.62 kg/m                 Constitutional: Pt appears in NAD                HENT: Head: NCAT.                Right Ear: External ear normal.                 Left Ear: External ear normal.  Eyes: . Pupils are equal, round, and reactive to light. Conjunctivae and EOM are normal               Nose: without d/c or deformity               Neck: Neck supple. Gross normal ROM but mild to mod diffuse tender to post bilateral paracervical areas and trapezoids               Cardiovascular: Normal rate and regular rhythm.                 Pulmonary/Chest: Effort normal and breath sounds without rales or wheezing.                Abd:  Soft, NT, ND, + BS, no organomegaly               Neurological: Pt is alert. At baseline orientation, motor grossly intact               Skin: Skin is warm. No rashes, no other new lesions, LE edema - none               Psychiatric: Pt behavior is normal without agitation   Micro: none  Cardiac tracings I have personally interpreted today:  none  Pertinent Radiological findings (summarize): none   Lab Results  Component Value Date   WBC 7.8 08/27/2020   HGB 15.0 08/27/2020   HCT 44.5 08/27/2020   PLT 312.0 08/27/2020   GLUCOSE 157 (H) 08/27/2020   CHOL 160 03/11/2018   TRIG 88.0 03/11/2018   HDL 47.80 03/11/2018   LDLCALC 95 03/11/2018   ALT 17 08/27/2020   AST 18 08/27/2020   NA 136 08/27/2020   K 3.6 08/27/2020   CL 101 08/27/2020   CREATININE 1.03 08/27/2020   BUN 15 08/27/2020   CO2 30 08/27/2020   TSH 1.04 02/13/2020   HGBA1C 6.8 (A) 03/27/2021   MICROALBUR <0.7 03/11/2018   Assessment/Plan:  BRITTON PERKINSON is a 50 y.o. Black or African American [2] female with  has a past medical history of Allergy, Asthma, Diabetes mellitus without complication ([place]), DJD (degenerative joint disease) of knee, Endometriosis, Fibrocystic breast disease, GERD (gastroesophageal reflux disease), History of endometriosis, Hyperlipidemia (06/29/2011), and Preeclampsia.  Encounter for well adult exam with abnormal findings Age  and sex appropriate education and counseling updated with regular exercise and diet Referrals for preventative services - due for hep c screen, plans to all GYN on her own Immunizations addressed - declines covid booster, pneumovax, flu shot Smoking counseling  - none needed Evidence for depression or other mood disorder - chronic anxiety no change mild persistent Most recent labs reviewed. I have personally reviewed and have noted: 1) the patient's medical and social history 2) The patient's current medications and supplements 3) The patient's height, weight, and BMI have been recorded in the chart   Allergic rhinitis Uncontrolled mild to mod, also for allergy referral  Asthma Stable recently, cont same tx albuterol hfa prn  Hyperlipidemia Lab Results  Component Value Date   [place] 95 03/11/2018   Uncontrolled, goal ldl < 70,, pt to continue current low chol diet, for f/u lab, consider add statin   Diabetes ([place]) Lab Results  Component Value Date   HGBA1C 6.8 (A) 03/27/2021   Uncontrolled mild, for f/u lab and endo, pt to continue current medical treatment jardiance, metformin   Pain, upper back bialteral post neck and upper back without  radicular symptoms, for restart robaxin prn,  to f/u any worsening symptoms or concerns  Left carpal tunnel syndrome Mild recent worsening, for left wrist splint at night,  to f/u any worsening symptoms or concerns  Followup: Return in about 1 year (around 07/18/2022).  Cathlean Cower, MD 07/19/2021 5:43 AM [facility]

## 2021-07-18 NOTE — Patient Instructions (Signed)
Please take all new medication as prescribed - the robaxin for pain  You will be contacted regarding the referral for: allergy  Please continue all other medications as before, and refills have been done if requested.  Please have the pharmacy call with any other refills you may need.  Please continue your efforts at being more active, low cholesterol diet, and weight control.  You are otherwise up to date with prevention measures today.  Please keep your appointments with your specialists as you may have planned  Please go to the LAB at the blood drawing area for the tests to be done  You will be contacted by phone if any changes need to be made immediately.  Otherwise, you will receive a letter about your results with an explanation, but please check with MyChart first.  Please remember to sign up for MyChart if you have not done so, as this will be important to you in the future with finding out test results, communicating by private email, and scheduling acute appointments online when needed.  Please make an Appointment to return for your 1 year visit, or sooner if needed

## 2021-07-19 ENCOUNTER — Encounter: Payer: Self-pay | Admitting: Internal Medicine

## 2021-07-19 DIAGNOSIS — G5602 Carpal tunnel syndrome, left upper limb: Secondary | ICD-10-CM | POA: Insufficient documentation

## 2021-07-19 NOTE — Assessment & Plan Note (Signed)
Mild recent worsening, for left wrist splint at night,  to f/u any worsening symptoms or concerns

## 2021-07-19 NOTE — Assessment & Plan Note (Signed)
bialteral post neck and upper back without radicular symptoms, for restart robaxin prn,  to f/u any worsening symptoms or concerns

## 2021-07-19 NOTE — Assessment & Plan Note (Signed)
Lab Results  Component Value Date   HGBA1C 6.8 (A) 03/27/2021   Uncontrolled mild, for f/u lab and endo, pt to continue current medical treatment jardiance, metformin

## 2021-07-19 NOTE — Assessment & Plan Note (Signed)
Uncontrolled mild to mod, also for allergy referral

## 2021-07-19 NOTE — Assessment & Plan Note (Signed)
Age and sex appropriate education and counseling updated with regular exercise and diet Referrals for preventative services - due for hep c screen, plans to all GYN on her own Immunizations addressed - declines covid booster, pneumovax, flu shot Smoking counseling  - none needed Evidence for depression or other mood disorder - chronic anxiety no change mild persistent Most recent labs reviewed. I have personally reviewed and have noted: 1) the patient's medical and social history 2) The patient's current medications and supplements 3) The patient's height, weight, and BMI have been recorded in the chart

## 2021-07-19 NOTE — Assessment & Plan Note (Signed)
Stable recently, cont same tx albuterol hfa prn

## 2021-07-19 NOTE — Assessment & Plan Note (Signed)
Lab Results  Component Value Date   [place] 03/11/2018   Uncontrolled, goal ldl < 70,, pt to continue current low chol diet, for f/u lab, consider add statin

## 2021-07-23 ENCOUNTER — Other Ambulatory Visit (INDEPENDENT_AMBULATORY_CARE_PROVIDER_SITE_OTHER): Payer: BC Managed Care – PPO

## 2021-07-23 ENCOUNTER — Encounter: Payer: Self-pay | Admitting: Internal Medicine

## 2021-07-23 DIAGNOSIS — Z1159 Encounter for screening for other viral diseases: Secondary | ICD-10-CM

## 2021-07-23 DIAGNOSIS — E119 Type 2 diabetes mellitus without complications: Secondary | ICD-10-CM | POA: Diagnosis not present

## 2021-07-23 DIAGNOSIS — Z0000 Encounter for general adult medical examination without abnormal findings: Secondary | ICD-10-CM | POA: Diagnosis not present

## 2021-07-23 LAB — HEPATIC FUNCTION PANEL
ALT: 18 U/L (ref 0–35)
AST: 17 U/L (ref 0–37)
Albumin: 4.3 g/dL (ref 3.5–5.2)
Alkaline Phosphatase: 82 U/L (ref 39–117)
Bilirubin, Direct: 0.2 mg/dL (ref 0.0–0.3)
Total Bilirubin: 0.7 mg/dL (ref 0.2–1.2)
Total Protein: 7.4 g/dL (ref 6.0–8.3)

## 2021-07-23 LAB — BASIC METABOLIC PANEL
BUN: 11 mg/dL (ref 6–23)
CO2: 27 mEq/L (ref 19–32)
Calcium: 9.7 mg/dL (ref 8.4–10.5)
Chloride: 106 mEq/L (ref 96–112)
Creatinine, Ser: 1.16 mg/dL (ref 0.40–1.20)
GFR: 55.21 mL/min — ABNORMAL LOW (ref >60.00)
Glucose, Bld: 142 mg/dL — ABNORMAL HIGH (ref 70–99)
Potassium: 4.1 mEq/L (ref 3.5–5.1)
Sodium: 141 mEq/L (ref 135–145)

## 2021-07-23 LAB — CBC WITH DIFFERENTIAL/PLATELET
Basophils Absolute: 0 10*3/uL (ref 0.0–0.1)
Basophils Relative: 0.6 % (ref 0.0–3.0)
Eosinophils Absolute: 0.1 10*3/uL (ref 0.0–0.7)
Eosinophils Relative: 2.6 % (ref 0.0–5.0)
HCT: 44.3 % (ref 36.0–46.0)
Hemoglobin: 14.7 g/dL (ref 12.0–15.0)
Lymphocytes Relative: 32.8 % (ref 12.0–46.0)
Lymphs Abs: 1.9 10*3/uL (ref 0.7–4.0)
MCHC: 33.2 g/dL (ref 30.0–36.0)
MCV: 88 fl (ref 78.0–100.0)
Monocytes Absolute: 0.4 10*3/uL (ref 0.1–1.0)
Monocytes Relative: 7.6 % (ref 3.0–12.0)
Neutro Abs: 3.3 10*3/uL (ref 1.4–7.7)
Neutrophils Relative %: 56.4 % (ref 43.0–77.0)
Platelets: 268 10*3/uL (ref 150.0–400.0)
RBC: 5.03 Mil/uL (ref 3.87–5.11)
RDW: 14.4 % (ref 11.5–15.5)
WBC: 5.9 10*3/uL (ref 4.0–10.5)

## 2021-07-23 LAB — LIPID PANEL
Cholesterol: 164 mg/dL (ref 0–200)
HDL: 62.8 mg/dL (ref >39.00)
LDL Cholesterol: 83 mg/dL (ref 0–99)
NonHDL: 101.5
Total CHOL/HDL Ratio: 3
Triglycerides: 95 mg/dL (ref 0.0–149.0)
VLDL: 19 mg/dL (ref 0.0–40.0)

## 2021-07-23 LAB — URINALYSIS, ROUTINE W REFLEX MICROSCOPIC
Bilirubin Urine: NEGATIVE
Hgb urine dipstick: NEGATIVE
Ketones, ur: NEGATIVE
Leukocytes,Ua: NEGATIVE
Nitrite: NEGATIVE
RBC / HPF: NONE SEEN (ref 0–?)
Specific Gravity, Urine: 1.01 (ref 1.000–1.030)
Total Protein, Urine: NEGATIVE
Urine Glucose: >=1000 — AB
Urobilinogen, UA: 0.2 (ref 0.0–1.0)
WBC, UA: NONE SEEN (ref 0–?)
pH: 6 (ref 5.0–8.0)

## 2021-07-23 LAB — MICROALBUMIN / CREATININE URINE RATIO
Creatinine,U: 77 mg/dL
Microalb Creat Ratio: 0.9 mg/g (ref 0.0–30.0)
Microalb, Ur: <0.7 mg/dL (ref 0.0–1.9)

## 2021-07-23 LAB — HEPATITIS C ANTIBODY
Hepatitis C Ab: NONREACTIVE
SIGNAL TO CUT-OFF: 0.02 (ref <1.00)

## 2021-07-23 LAB — HEMOGLOBIN A1C: Hgb A1c MFr Bld: 7 % — ABNORMAL HIGH (ref 4.6–6.5)

## 2021-07-23 LAB — TSH: TSH: 0.93 u[IU]/mL (ref 0.35–5.50)

## 2021-07-23 NOTE — Addendum Note (Signed)
Addended by: Susy Manor on: 33/29/5188 09:05 AM   Modules accepted: Orders

## 2021-09-02 ENCOUNTER — Ambulatory Visit: Payer: BC Managed Care – PPO | Admitting: Endocrinology

## 2021-09-05 ENCOUNTER — Other Ambulatory Visit: Payer: Self-pay

## 2021-09-05 ENCOUNTER — Ambulatory Visit: Payer: BC Managed Care – PPO | Admitting: Endocrinology

## 2021-09-05 ENCOUNTER — Encounter: Payer: Self-pay | Admitting: Endocrinology

## 2021-09-05 VITALS — BP 136/88 | HR 107 | Ht 65.0 in | Wt 197.2 lb

## 2021-09-05 DIAGNOSIS — E1165 Type 2 diabetes mellitus with hyperglycemia: Secondary | ICD-10-CM

## 2021-09-05 LAB — POCT GLYCOSYLATED HEMOGLOBIN (HGB A1C): Hemoglobin A1C: 6.8 % — AB (ref 4.0–5.6)

## 2021-09-05 MED ORDER — EMPAGLIFLOZIN 25 MG PO TABS: 25.0000 mg | ORAL_TABLET | Freq: Every day | ORAL | 3 refills | Status: DC

## 2021-09-05 NOTE — Progress Notes (Signed)
Subjective:    Patient ID: Claire Irwin, female    DOB: November 21, 1970, 51 y.o.   MRN: [id number]  HPI Pt returns for f/u of diabetes mellitus: DM type: 2 Dx'ed: 5573 Complications: CRI Therapy: 3 oral meds GDM: never DKA: never Severe hypoglycemia: never.  Pancreatitis: never.  Other: she has never taken insulin; she did not tolerate farxiga (rash).   Interval history: pt says metformin causes epigast pain.  She has not recently checked cbg.  She takes other meds as rx'ed.   Past Medical History:  Diagnosis Date   Allergy    Asthma    Diabetes mellitus without complication (HCC)    DJD (degenerative joint disease) of knee    LEFT   Endometriosis    Fibrocystic breast disease    GERD (gastroesophageal reflux disease)    History of endometriosis    Hyperlipidemia 06/29/2011   Preeclampsia     Past Surgical History:  Procedure Laterality Date   ABDOMINAL ADHESION SURGERY  07/2004   CESAREAN SECTION  02/2004   DILATION AND CURETTAGE, DIAGNOSTIC / THERAPEUTIC     PARTIAL HYSTERECTOMY     still have ovaries    Social History   Socioeconomic History   Marital status: Legally Separated    Spouse name: Not on file   Number of children: 2   Years of education: Not on file   Highest education level: Not on file  Occupational History   Occupation: Pharmacist, hospital  Tobacco Use   Smoking status: Never   Smokeless tobacco: Never  Vaping Use   Vaping Use: Never used  Substance and Sexual Activity   Alcohol use: No   Drug use: No   Sexual activity: Not on file  Other Topics Concern   Not on file  Social History Narrative   Not on file   Social Determinants of Health   Financial Resource Strain: Not on file  Food Insecurity: Not on file  Transportation Needs: Not on file  Physical Activity: Not on file  Stress: Not on file  Social Connections: Not on file  Intimate Partner Violence: Not on file    Current Outpatient Medications on File Prior to Visit  Medication  Sig Dispense Refill   acarbose (PRECOSE) 25 MG tablet Take 1 tablet (25 mg total) by mouth 3 (three) times daily with meals. 90 tablet 11   albuterol (VENTOLIN HFA) 108 (90 Base) MCG/ACT inhaler USE 2 PUFFS 4 TIMES DAILY AS NEEDED 8.5 g 2   aspirin EC 81 MG tablet Take 1 tablet (81 mg total) by mouth daily. 90 tablet 11   Calcium Carbonate Antacid (TUMS PO) Take 1 tablet by mouth as needed.     Cholecalciferol (VITAMIN D-3) 125 MCG (5000 UT) TABS Take 1 tablet by mouth daily as needed.     Diclofenac Sodium (PENNSAID) 2 % SOLN Apply topically as needed.     Docusate Calcium (STOOL SOFTENER PO) Take 1 tablet by mouth as needed.     famotidine (PEPCID) 20 MG tablet Take 1 tablet (20 mg total) by mouth 2 (two) times daily. 180 tablet 3   Fluticasone-Salmeterol (WIXELA INHUB) 250-50 MCG/DOSE AEPB USE 1 PUFF TWO TIMES DAILY AS NEEDED 180 each 3   LINZESS 290 MCG CAPS capsule Take 290 mcg by mouth daily.     loratadine (CLARITIN) 10 MG tablet Take 10 mg by mouth daily as needed for allergies.     lubiprostone (AMITIZA) 24 MCG capsule Take 1 capsule (24 mcg total) by  mouth 2 (two) times daily. 60 capsule 2   meloxicam (MOBIC) 15 MG tablet Take 1 tablet (15 mg total) by mouth daily. 30 tablet 0   methocarbamol (ROBAXIN) 500 MG tablet Take 1 tablet (500 mg total) by mouth every 6 (six) hours as needed for muscle spasms. 60 tablet 3   Multiple Vitamins-Calcium (ONE-A-DAY WOMENS PO) Take by mouth.     norethindrone (MICRONOR) 0.35 MG tablet Take 1 tablet by mouth daily.     ONETOUCH ULTRA test strip USE TO MONITOR GLUCOSE LEVELS ONCE PER DAY 50 strip 12   pantoprazole (PROTONIX) 40 MG tablet TAKE 1 TABLET BY MOUTH TWICE A DAY 180 tablet 1   sitaGLIPtin (JANUVIA) 100 MG tablet Take 1 tablet (100 mg total) by mouth daily. 90 tablet 3   vitamin B-12 (CYANOCOBALAMIN) 1000 MCG tablet Take 1,000 mcg by mouth daily as needed.     No current facility-administered medications on file prior to visit.     Allergies  Allergen Reactions   Hydrocodone    Sulfonamide Derivatives     Family History  Problem Relation Age of Onset   Diabetes Mother    Hypertension Mother    Asthma Mother    Allergies Mother    Breast cancer Mother    Diabetes Father    Heart disease Paternal Uncle    Asthma Maternal Grandmother    Allergies Maternal Grandmother    Breast cancer Maternal Grandmother    Cancer Paternal Grandmother        Brain and Lung   Breast cancer Maternal Aunt    Colon cancer Neg Hx    Esophageal cancer Neg Hx    Stomach cancer Neg Hx    Rectal cancer Neg Hx     BP 136/88 (BP Location: Left Arm, Patient Position: Sitting, Cuff Size: Normal)    Pulse (!) 107    Ht 5\' 5"  (1.651 m)    Wt 197 lb 3.2 oz (89.4 kg)    SpO2 99%    BMI 32.82 kg/m    Review of Systems     Objective:   Physical Exam VITAL SIGNS:  See vs page GENERAL: no distress Pulses: dorsalis pedis intact bilat.   MSK: no deformity of the feet CV: no leg edema Skin:  no ulcer on the feet.  normal color and temp on the feet. Neuro: sensation is intact to touch on the feet.     Lab Results  Component Value Date   HGBA1C 6.8 (A) 09/05/2021   Lab Results  Component Value Date   TSH 0.93 07/23/2021   Lab Results  Component Value Date   CREATININE 1.16 07/23/2021   BUN 11 07/23/2021   NA 141 07/23/2021   K 4.1 07/23/2021   CL 106 07/23/2021   CO2 27 07/23/2021      Assessment & Plan:  Type 2 DM: well-controlled Epigast pain, due to metformin  Patient Instructions  I have sent a prescription to your pharmacy, to increase the Jardiance.   Please continue the same other Januvia. Please stop taking the metformin.   check your blood sugar once a day.  vary the time of day when you check, between before the 3 meals, and at bedtime.  also check if you have symptoms of your blood sugar being too high or too low.  please keep a record of the readings and bring it to your next appointment here (or you  can bring the meter itself).  You can write it  on any piece of paper.  please call us sooner if your blood sugar goes below 70, or if you have a lot of readings over 200.   Please come back for a follow-up appointment in 3-4 months.

## 2021-09-05 NOTE — Patient Instructions (Addendum)
I have sent a prescription to your pharmacy, to increase the Jardiance.   Please continue the same other Januvia. Please stop taking the metformin.   check your blood sugar once a day.  vary the time of day when you check, between before the 3 meals, and at bedtime.  also check if you have symptoms of your blood sugar being too high or too low.  please keep a record of the readings and bring it to your next appointment here (or you can bring the meter itself).  You can write it on any piece of paper.  please call us sooner if your blood sugar goes below 70, or if you have a lot of readings over 200.   Please come back for a follow-up appointment in 3-4 months.

## 2021-09-07 ENCOUNTER — Ambulatory Visit: Payer: Self-pay

## 2021-09-21 NOTE — Progress Notes (Signed)
NEW PATIENT Date of Service/Encounter:  09/24/21 Referring provider: none-self referred Primary care provider: Biagio Borg, MD  Subjective:  Claire Irwin is a 51 y.o. female with a PMHx of GERD, diastolic dysfunction, diabetes, chronic tonsillitis presenting today for evaluation of chronic rhinitis and asthma.  History obtained from: chart review and patient.   Chronic rhinitis: started in childhood Symptoms include:  also gets hives on roof of mouth, nasal congestion, rhinorrhea, post nasal drainage, sneezing, watery eyes, itchy eyes, and itchy nose  Occurs year-round, season changes are worse Potential triggers: dust mites, pollen Treatments tried: claritin and allegra can tolerate, Xyzal, Zyrtec, Benadryl-all make her sleepy groggy, afraid of allergy nasal sprays because of Covid Previous allergy testing:  yes in her 16s (had previously seen Dr. Shaune Leeks), remember being DM allergic and allergic to trees History of reflux/heartburn:  prescribed Pepcid 20 mg BID and protonix 40 mg daily, controlled Previous CT Sinus (01/19/19): with unremarkable impression without mass or evidence of sinusitis.   Asthma History:  -Diagnosed 17 years ago.  -Current symptoms include chest tightness and cough 3 daytime symptoms in past month, 6 nighttime awakenings in past month Using rescue inhaler only during flares only-about 4 times per year during season changes -Limitations to daily activity: none - 0 ED visits, 0 UC visits and 1 oral steroids in the past year - 0 number of lifetime hospitalizations, 0 number of lifetime intubations.  - Identified Triggers:  weather changes, cold air, dust mites - Up-to-date with Covid-19, vaccines. - History of prior pneumonias: 0 - History of prior COVID-19 infection: 0 - Smoking exposure: none Previous Diagnostics:  - Most Recent AEC (07/23/21): 100 -Most Recent Chest Imaging: CXR on (04/05/2013): no acute cardiopulmonary abnormality Management: .   - Current regimen:  - Maintenance: Wixela 250 1 puff PRN during flares (uses about one week every season) - Rescue: Albuterol 2 puffs q4-6 hrs PRN, none prior to exercise  Other allergy screening: Food allergy: no Medication allergy:  stomach pain with sulfas Hymenoptera allergy: no Urticaria: no Eczema:no History of recurrent infections suggestive of immunodeficency: no  Past Medical History: Past Medical History:  Diagnosis Date   Allergy    Asthma    Diabetes mellitus without complication (HCC)    DJD (degenerative joint disease) of knee    LEFT   Endometriosis    Fibrocystic breast disease    GERD (gastroesophageal reflux disease)    History of endometriosis    Hyperlipidemia 06/29/2011   Preeclampsia    Medication List:  Current Outpatient Medications  Medication Sig Dispense Refill   albuterol (VENTOLIN HFA) 108 (90 Base) MCG/ACT inhaler USE 2 PUFFS 4 TIMES DAILY AS NEEDED 8.5 g 2   aspirin EC 81 MG tablet Take 1 tablet (81 mg total) by mouth daily. 90 tablet 11   azelastine (ASTELIN) 0.1 % nasal spray 1-2 sprays each nostril twice daily 30 mL 5   empagliflozin (JARDIANCE) 25 MG TABS tablet Take 1 tablet (25 mg total) by mouth daily before breakfast. 90 tablet 3   EPINEPHrine (EPIPEN 2-PAK) 0.3 mg/0.3 mL IJ SOAJ injection Inject 0.3 mg into the muscle once for 1 dose. 2 each 1   Fluticasone-Salmeterol (WIXELA INHUB) 250-50 MCG/DOSE AEPB USE 1 PUFF TWO TIMES DAILY AS NEEDED 180 each 3   norethindrone (MICRONOR) 0.35 MG tablet Take 1 tablet by mouth daily.     Olopatadine HCl (PATADAY) 0.2 % SOLN Place 1 drop into both eyes daily as needed. 2.5 mL 5  sitaGLIPtin (JANUVIA) 100 MG tablet Take 1 tablet (100 mg total) by mouth daily. 90 tablet 3   acarbose (PRECOSE) 25 MG tablet Take 1 tablet (25 mg total) by mouth 3 (three) times daily with meals. (Patient not taking: Reported on 09/24/2021) 90 tablet 11   Calcium Carbonate Antacid (TUMS PO) Take 1 tablet by mouth as  needed. (Patient not taking: Reported on 09/24/2021)     Cholecalciferol (VITAMIN D-3) 125 MCG (5000 UT) TABS Take 1 tablet by mouth daily as needed. (Patient not taking: Reported on 09/24/2021)     Diclofenac Sodium (PENNSAID) 2 % SOLN Apply topically as needed. (Patient not taking: Reported on 09/24/2021)     Docusate Calcium (STOOL SOFTENER PO) Take 1 tablet by mouth as needed. (Patient not taking: Reported on 09/24/2021)     famotidine (PEPCID) 20 MG tablet Take 1 tablet (20 mg total) by mouth 2 (two) times daily. (Patient not taking: Reported on 09/24/2021) 180 tablet 3   LINZESS 290 MCG CAPS capsule Take 290 mcg by mouth daily. (Patient not taking: Reported on 09/24/2021)     loratadine (CLARITIN) 10 MG tablet Take 10 mg by mouth daily as needed for allergies. (Patient not taking: Reported on 09/24/2021)     lubiprostone (AMITIZA) 24 MCG capsule Take 1 capsule (24 mcg total) by mouth 2 (two) times daily. (Patient not taking: Reported on 09/24/2021) 60 capsule 2   meloxicam (MOBIC) 15 MG tablet Take 1 tablet (15 mg total) by mouth daily. (Patient not taking: Reported on 09/24/2021) 30 tablet 0   methocarbamol (ROBAXIN) 500 MG tablet Take 1 tablet (500 mg total) by mouth every 6 (six) hours as needed for muscle spasms. (Patient not taking: Reported on 09/24/2021) 60 tablet 3   Multiple Vitamins-Calcium (ONE-A-DAY WOMENS PO) Take by mouth. (Patient not taking: Reported on 09/24/2021)     ONETOUCH ULTRA test strip USE TO MONITOR GLUCOSE LEVELS ONCE PER DAY (Patient not taking: Reported on 09/24/2021) 50 strip 12   pantoprazole (PROTONIX) 40 MG tablet TAKE 1 TABLET BY MOUTH TWICE A DAY (Patient not taking: Reported on 09/24/2021) 180 tablet 1   vitamin B-12 (CYANOCOBALAMIN) 1000 MCG tablet Take 1,000 mcg by mouth daily as needed. (Patient not taking: Reported on 09/24/2021)     No current facility-administered medications for this visit.   Known Allergies:  Allergies  Allergen Reactions   Hydrocodone     Sulfonamide Derivatives    Past Surgical History: Past Surgical History:  Procedure Laterality Date   ABDOMINAL ADHESION SURGERY  07/2004   CESAREAN SECTION  02/2004   DILATION AND CURETTAGE, DIAGNOSTIC / THERAPEUTIC     PARTIAL HYSTERECTOMY     still have ovaries   Family History: Family History  Problem Relation Age of Onset   Diabetes Mother    Hypertension Mother    Asthma Mother    Allergies Mother    Breast cancer Mother    Diabetes Father    Heart disease Paternal Uncle    Asthma Maternal Grandmother    Allergies Maternal Grandmother    Breast cancer Maternal Grandmother    Cancer Paternal Grandmother        Brain and Lung   Breast cancer Maternal Aunt    Colon cancer Neg Hx    Esophageal cancer Neg Hx    Stomach cancer Neg Hx    Rectal cancer Neg Hx    Social History: [place] lives in a house build 46 years ago, + water damage now repaired, + carpte,  gas heating, central AC, no pets, cockroaches present at work, using DM protection on bedding, no smoke exposure, works as a Pharmacist, hospital x 26 years, exposed to dust at work, + [employer] in [place]   ROS:  All other systems negative except as noted per HPI.  Objective:  Blood pressure 118/76, pulse (!) 117, temperature 97.8 F (36.6 C), temperature source Temporal, resp. rate 18, height 5\' 5"  (1.651 m), weight 200 lb 9.6 oz (91 kg), SpO2 98 %. Body mass index is 33.38 kg/m. Physical Exam:  General Appearance:  Alert, cooperative, no distress, appears stated age  Head:  Normocephalic, without obvious abnormality, atraumatic  Eyes:  Conjunctiva clear, EOM's intact  Nose: Nares normal,  slightly deviated septum, hypertrophic turbinates, normal mucosa, and no visible anterior polyps  Throat: Lips, tongue normal; teeth and gums normal, normal posterior oropharynx and + cobblestoning  Neck: Supple, symmetrical  Lungs:   clear to auscultation bilaterally, Respirations unlabored, no  coughing  Heart:  regular rate and rhythm and no murmur, Appears well perfused  Extremities: No edema  Skin: Skin color, texture, turgor normal, no rashes or lesions on visualized portions of skin  Neurologic: No gross deficits     Diagnostics: Spirometry:  Tracings reviewed. Her effort: Good reproducible efforts. FVC: 2.56L (pre), 2.67L  (post) FEV1: 2.15L, 87% predicted (pre), 2.33L, 95% predicted (post) +8% improvement FEV1/FVC ratio: 104% (pre), 107% (post) Interpretation: Spirometry consistent with normal pattern with partial response to bronchodilator response  Skin Testing: Environmental allergy panel.  Adequate controls. Results discussed with patient/family.  Airborne Adult Perc - 09/24/21 1011     Time Antigen Placed 1015    Allergen Manufacturer Lavella Hammock    Location Back    Number of Test 59    1. Control-Buffer 50% Glycerol Negative    2. Control-Histamine 1 mg/ml 3+    3. Albumin saline Negative    4. [place] Negative    5. [place] Negative    6. Johnson Negative    7. [place] Blue Negative    8. Meadow Fescue Negative    9. Perennial Rye Negative    10. Sweet Vernal Negative    11. Timothy Negative    12. Cocklebur Negative    13. Burweed Marshelder Negative    14. Ragweed, short Negative    15. Ragweed, Giant Negative    16. Plantain,  English Negative    17. Lamb's Quarters Negative    18. Sheep Sorrell Negative    19. Rough Pigweed Negative    20. Marsh Elder, Rough Negative    21. Mugwort, Common Negative    22. Ash mix Negative    23. Birch mix Negative    24. Beech American Negative    25. Box, Elder Negative    26. Cedar, red Negative    27. Cottonwood, [place] Negative    28. Elm mix Negative    29. Hickory Negative    30. Maple mix Negative    31. Oak, [place] mix Negative    32. Pecan Pollen Negative    33. Pine mix Negative    34. Sycamore Eastern Negative    35. [place], Black Pollen Negative    36. Alternaria alternata Negative    37.  Cladosporium Herbarum Negative    38. Aspergillus mix Negative    39. Penicillium mix Negative    40. Bipolaris sorokiniana (Helminthosporium) Negative    41. Drechslera spicifera (Curvularia) Negative    42. Mucor plumbeus Negative  43. Fusarium moniliforme Negative    44. Aureobasidium pullulans (pullulara) Negative    45. Rhizopus oryzae Negative    46. Botrytis cinera Negative    47. Epicoccum nigrum Negative    48. Phoma betae Negative    49. Candida Albicans Negative    50. Trichophyton mentagrophytes Negative    51. Mite, D Farinae  5,000 AU/ml Negative    52. Mite, D Pteronyssinus  5,000 AU/ml Negative    53. Cat Hair 10,000 BAU/ml Negative    54.  Dog Epithelia Negative    55. Mixed Feathers Negative    56. Horse Epithelia Negative    57. Cockroach, German Negative    58. Mouse Negative    59. Tobacco Leaf Negative             Intradermal - 09/24/21 1108     Time Antigen Placed 1100    Allergen Manufacturer Greer    Location Arm    Number of Test 15    Control Negative    [place] Negative    Johnson Negative    7 Grass Negative    Ragweed mix Negative    Weed mix 3+    Tree mix Negative    Mold 1 Negative    Mold 2 Negative    Mold 3 Negative    Mold 4 Negative    Cat Negative    Dog 2+    Cockroach 3+    Mite mix 4+    Other Omitted             Allergy testing results were read and interpreted by myself, documented by clinical staff.  Assessment and Plan   Patient Instructions  Chronic Rhinitis -seasonal and perennial allergic-uncontrolled: - allergy testing today was positive with intradermals to dust mites, weed mix, dog and cockroach-surprising based on history - will obtain labs to confirm no other allergens present given history and desire to do allergy shots - allergen avoidance as below - consider allergy shots as long term control of your symptoms by teaching your immune system to be more tolerant of your allergy triggers  -  Epipen will be prescribed to be brought to your allergy shots appointment in case of reaction to shots - Start Nasal Steroid Spray: Options include Flonase (fluticasone), Nasocort (triamcinolone), Nasonex (mometasome) 1- 2 sprays in each nostril daily (can buy over-the-counter if not covered by insurance)  Best results if used daily. - Start Astelin (Azelastine) 1-2 sprays in each nostril twice a day as needed.  You may use this as needed for nasal congestion/itchy ears/itchy nose if desired - Continue over the counter antihistamine daily or daily as needed can take up to 2 daily.   -Your options include Claritin (Loratadine) 10mg , Allegra (Fexofenadine) 180mg   Allergic Conjunctivitis:  - Start Allergy Eye drops: great options include Pataday (Olopatadine) or Zaditor (ketotifen) for eye symptoms daily as needed-both sold over the counter if not covered by insurance.   -Avoid eye drops that say red eye relief as they may contain medications that dry out your eyes.  Mild Persistent Asthma:uncontrolled (multiple night time awakenings due to cough) - your lung testing today looked good but did show partial reversibility with albuterol - Controller Inhaler: Increase Wixela 250 1 puff once a day; This Should Be Used Everyday; can increase to 1 puff twice a day during flares for at least one week or until symptoms resolve - Rinse mouth out after use - Rescue Inhaler: Albuterol (Proair/Ventolin) 2 puffs .  Use  every 4-6 hours as needed for chest tightness, wheezing, or coughing.  Can also use 15 minutes prior to exercise if you have symptoms with activity. - Asthma is not controlled if:  - Symptoms are occurring >2 times a week OR  - >2 times a month nighttime awakenings  - You are requiring systemic steroids (prednisone/steroid injections) more than once per year  - Your require hospitalization for your asthma.  - Please call the clinic to schedule a follow up if these symptoms arise   It was a  pleasure meeting you in clinic today!  Follow-up in 3 months, sooner for allergy shots.  Sigurd Sos, MD [facility]  This note in its entirety was forwarded to the Provider who requested this consultation.  Thank you for your kind referral. I appreciate the opportunity to take part in Abia's care. Please do not hesitate to contact me with questions.  Sincerely,  Sigurd Sos, MD [facility] [place]

## 2021-09-24 ENCOUNTER — Ambulatory Visit: Payer: BC Managed Care – PPO | Admitting: Internal Medicine

## 2021-09-24 ENCOUNTER — Encounter: Payer: Self-pay | Admitting: Internal Medicine

## 2021-09-24 ENCOUNTER — Other Ambulatory Visit: Payer: Self-pay

## 2021-09-24 VITALS — BP 118/76 | HR 117 | Temp 97.8°F | Resp 18 | Ht 65.0 in | Wt 200.6 lb

## 2021-09-24 DIAGNOSIS — J310 Chronic rhinitis: Secondary | ICD-10-CM

## 2021-09-24 DIAGNOSIS — R053 Chronic cough: Secondary | ICD-10-CM | POA: Diagnosis not present

## 2021-09-24 DIAGNOSIS — H1013 Acute atopic conjunctivitis, bilateral: Secondary | ICD-10-CM | POA: Insufficient documentation

## 2021-09-24 DIAGNOSIS — J4530 Mild persistent asthma, uncomplicated: Secondary | ICD-10-CM | POA: Diagnosis not present

## 2021-09-24 DIAGNOSIS — J302 Other seasonal allergic rhinitis: Secondary | ICD-10-CM

## 2021-09-24 DIAGNOSIS — J3089 Other allergic rhinitis: Secondary | ICD-10-CM | POA: Insufficient documentation

## 2021-09-24 MED ORDER — ALBUTEROL SULFATE HFA 108 (90 BASE) MCG/ACT IN AERS: 2.0000 | INHALATION_SPRAY | RESPIRATORY_TRACT | 2 refills | Status: AC | PRN

## 2021-09-24 MED ORDER — OLOPATADINE HCL 0.2 % OP SOLN: 1.0000 [drp] | Freq: Every day | OPHTHALMIC | 5 refills | Status: DC | PRN

## 2021-09-24 MED ORDER — AZELASTINE HCL 0.1 % NA SOLN: NASAL | 5 refills | Status: DC

## 2021-09-24 MED ORDER — FLUTICASONE-SALMETEROL 250-50 MCG/ACT IN AEPB: 1.0000 | INHALATION_SPRAY | Freq: Every day | RESPIRATORY_TRACT | 5 refills | Status: DC

## 2021-09-24 MED ORDER — EPINEPHRINE 0.3 MG/0.3ML IJ SOAJ: 0.3000 mg | Freq: Once | INTRAMUSCULAR | 1 refills | Status: AC

## 2021-09-24 NOTE — Patient Instructions (Addendum)
Chronic Rhinitis -seasonal and perennial allergic: - allergy testing today was positive with intradermals to dust mites, weed mix, dog and cockroach - will  obtain labs to confirm no other allergens present given history and desire to do allergy shots - allergen avoidance as below - consider allergy shots as long term control of your symptoms by teaching your immune system to be more tolerant of your allergy triggers  - Epipen will be prescribed to be brought to your allergy shots appointment in case of reaction to shots - Start Nasal Steroid Spray: Options include Flonase (fluticasone), Nasocort (triamcinolone), Nasonex (mometasome) 1- 2 sprays in each nostril daily (can buy over-the-counter if not covered by insurance)  Best results if used daily. - Start Astelin (Azelastine) 1-2 sprays in each nostril twice a day as needed.  You may use this as needed for nasal congestion/itchy ears/itchy nose if desired - Continue over the counter antihistamine daily or daily as needed can take up to 2 daily.   -Your options include Claritin (Loratadine) 10mg , Allegra (Fexofenadine) 180mg   Allergic Conjunctivitis:  - Start Allergy Eye drops: great options include Pataday (Olopatadine) or Zaditor (ketotifen) for eye symptoms daily as needed-both sold over the counter if not covered by insurance.   -Avoid eye drops that say red eye relief as they may contain medications that dry out your eyes.  Mild Persistent Asthma:uncontrolled (multiple night time awakenings due to cough) - your lung testing today looked good - Controller Inhaler: Increase Wixela 250 1 puff once a day; This Should Be Used Everyday: can increase to 1 puff twice a day during flares for at least one week or until symptoms resolve - Rinse mouth out after use - Rescue Inhaler: Albuterol (Proair/Ventolin) 2 puffs . Use  every 4-6 hours as needed for chest tightness, wheezing, or coughing.  Can also use 15 minutes prior to exercise if you have  symptoms with activity. - Asthma is not controlled if:  - Symptoms are occurring >2 times a week OR  - >2 times a month nighttime awakenings  - You are requiring systemic steroids (prednisone/steroid injections) more than once per year  - Your require hospitalization for your asthma.  - Please call the clinic to schedule a follow up if these symptoms arise   It was a pleasure meeting you in clinic today!  Follow-up in 3 months, sooner for allergy shots.  Claire Sos, MD [facility]   DUST MITE AVOIDANCE MEASURES:  There are three main measures that need and can be taken to avoid house dust mites:  Reduce accumulation of dust in general -reduce furniture, clothing, carpeting, books, stuffed animals, especially in bedroom  Separate yourself from the dust -use pillow and mattress encasements (can be found at stores such as [employer] or online) -avoid direct exposure to air condition flow -use a HEPA filter device, especially in the bedroom; you can also use a HEPA filter vacuum cleaner -wipe dust with a moist towel instead of a dry towel or broom when cleaning  Decrease mites and/or their secretions -wash clothing and linen and stuffed animals at highest temperature possible, at least every 2 weeks -stuffed animals can also be placed in a bag and put in a freezer overnight  Despite the above measures, it is impossible to eliminate dust mites or their allergen completely from your home.  With the above measures the burden of mites in your home can be diminished, with the goal of minimizing your  allergic symptoms.  Success will be reached only when implementing and using all means together. Control of Cockroach Allergen  Cockroach allergen has been identified as an important cause of acute attacks of asthma, especially in urban settings.  There are fifty-five species of cockroach that exist in the [place], however only three, the [place], [place] species produce allergen that can affect patients with Asthma.  Allergens can be obtained from fecal particles, egg casings and secretions from cockroaches.    Remove food sources. Reduce access to water. Seal access and entry points. Spray runways with 0.5-1% Diazinon or Chlorpyrifos Blow boric acid power under stoves and refrigerator. Place bait stations (hydramethylnon) at feeding sites. Control of Dog or Cat Allergen  Avoidance is the best way to manage a dog or cat allergy. If you have a dog or cat and are allergic to dog or cats, consider removing the dog or cat from the home. If you have a dog or cat but dont want to find it a new home, or if your family wants a pet even though someone in the household is allergic, here are some strategies that may help keep symptoms at bay:  Keep the pet out of your bedroom and restrict it to only a few rooms. Be advised that keeping the dog or cat in only one room will not limit the allergens to that room. Dont pet, hug or kiss the dog or cat; if you do, wash your hands with soap and water. High-efficiency particulate air (HEPA) cleaners run continuously in a bedroom or living room can reduce allergen levels over time. Regular use of a high-efficiency vacuum cleaner or a central vacuum can reduce allergen levels. Giving your dog or cat a bath at least once a week can reduce airborne allergen.

## 2021-09-27 LAB — ALLERGENS, ZONE 2

## 2021-09-29 NOTE — Progress Notes (Signed)
Please let the patient of the following:  Your allergy blood test for environmental allergens was negative.  We can still move forward with allergy shots if you are interested.  We would mix to the things that were positive on intradermal's.  However, I am not sure exactly how much response we will get based on your results.  I am happy to give this a try if you are interested.

## 2021-10-06 ENCOUNTER — Ambulatory Visit
Admission: RE | Admit: 2021-10-06 | Discharge: 2021-10-06 | Disposition: A | Discharge status: Home / Self Care | Payer: BC Managed Care – PPO | Source: Ambulatory Visit | Attending: Emergency Medicine | Admitting: Emergency Medicine

## 2021-10-06 ENCOUNTER — Other Ambulatory Visit: Payer: Self-pay

## 2021-10-06 VITALS — BP 119/84 | HR 111 | Temp 99.7°F | Resp 18

## 2021-10-06 DIAGNOSIS — J310 Chronic rhinitis: Secondary | ICD-10-CM | POA: Diagnosis not present

## 2021-10-06 DIAGNOSIS — J329 Chronic sinusitis, unspecified: Secondary | ICD-10-CM

## 2021-10-06 DIAGNOSIS — Z20822 Contact with and (suspected) exposure to covid-19: Secondary | ICD-10-CM

## 2021-10-06 DIAGNOSIS — H9209 Otalgia, unspecified ear: Secondary | ICD-10-CM

## 2021-10-06 DIAGNOSIS — B349 Viral infection, unspecified: Secondary | ICD-10-CM

## 2021-10-06 MED ORDER — PROMETHAZINE-DM 6.25-15 MG/5ML PO SYRP: 5.0000 mL | ORAL_SOLUTION | Freq: Four times a day (QID) | ORAL | 0 refills | Status: DC | PRN

## 2021-10-06 MED ORDER — MONTELUKAST SODIUM 10 MG PO TABS: 10.0000 mg | ORAL_TABLET | Freq: Every day | ORAL | 2 refills | Status: AC

## 2021-10-06 MED ORDER — NIRMATRELVIR/RITONAVIR (PAXLOVID)TABLET: ORAL_TABLET | ORAL | 0 refills | Status: DC

## 2021-10-06 NOTE — ED Provider Notes (Signed)
[facility]    CSN: [id number] Arrival date & time: 10/06/21  1010    HISTORY   Chief Complaint  Patient presents with   Ear Drainage   Otalgia   Nasal Congestion   Appointment   HPI Claire Irwin is a 51 y.o. female. Patient complains of 4-day history of ear pain more on the right, nasal congestion, nasal drainage and headache.  Patient states one of her coworkers tested positive for COVID.  Patient reports a history of allergies, states she is currently using a nose spray, eyedrops, and inhaler and allergy medications.  Patient has a mildly elevated temperature with elevated heart rate on arrival today.  The history is provided by the patient.  Past Medical History:  Diagnosis Date   Allergy    Asthma    Diabetes mellitus without complication (HCC)    DJD (degenerative joint disease) of knee    LEFT   Endometriosis    Fibrocystic breast disease    GERD (gastroesophageal reflux disease)    History of endometriosis    Hyperlipidemia 06/29/2011   Preeclampsia    Patient Active Problem List   Diagnosis Date Noted   Seasonal and perennial allergic rhinitis 09/24/2021   Allergic conjunctivitis of both eyes 09/24/2021   Left carpal tunnel syndrome 07/19/2021   Bilateral knee pain 08/27/2020   Chalazion of right upper eyelid 08/08/2019   Neck pain 05/03/2019   Nonallopathic lesion of sacral region 04/05/2019   Nonallopathic lesion of lumbosacral region 04/05/2019   Wart of face 04/03/2019   Sinusitis 01/18/2019   Chronic constipation 01/10/2019   Flu-like symptoms 10/31/2018   Nonallopathic lesion of thoracic region 10/11/2018   Nonallopathic lesion of rib cage 10/11/2018   Nonallopathic lesion of cervical region 10/11/2018   Tonsil stone 06/17/2018   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[place]) 06/14/2018   Pharyngitis 05/30/2018   Chronic tonsillitis 05/30/2018   Left shoulder pain 05/30/2018   Fatigue  07/01/2016   Toe pain, bilateral 02/14/2016   Pain in limb 02/14/2016   Diabetes ([place]) 02/14/2016   Anxiety state 01/02/2015   Varicose veins of lower extremities with other complications 40/03/6760   Diastolic dysfunction 95/05/3266   Peripheral edema 04/05/2013   Tenosynovitis of forearm 10/30/2012   Pain, upper back 10/30/2012   Hyperlipidemia 06/29/2011   Encounter for well adult exam with abnormal findings 12/26/2010   OSTEOARTHRITIS, KNEE, LEFT 07/14/2010   BACK PAIN, LUMBAR 02/21/2010   HYPERSOMNIA 07/17/2009   Rash 02/05/2009   BREAST MASS 06/27/2008   Allergic rhinitis 05/31/2008   Asthma 05/31/2008   GERD 05/31/2008   Past Surgical History:  Procedure Laterality Date   ABDOMINAL ADHESION SURGERY  07/2004   CESAREAN SECTION  02/2004   DILATION AND CURETTAGE, DIAGNOSTIC / THERAPEUTIC     PARTIAL HYSTERECTOMY     still have ovaries   OB History   No obstetric history on file.    Home Medications    Prior to Admission medications   Medication Sig Start Date End Date Taking? Authorizing Provider  albuterol (VENTOLIN HFA) 108 (90 Base) MCG/ACT inhaler Inhale 2 puffs into the lungs every 4 (four) hours as needed for wheezing or shortness of breath. 09/24/21   Sigurd Sos, MD  aspirin EC 81 MG tablet Take 1 tablet (81 mg total) by mouth daily. 07/01/16   Biagio Borg, MD  azelastine (ASTELIN) 0.1 % nasal spray 1-2 sprays each nostril twice daily 09/24/21   Simona Huh,  Junie Panning, MD  empagliflozin (JARDIANCE) 25 MG TABS tablet Take 1 tablet (25 mg total) by mouth daily before breakfast. 09/05/21   Renato Shin, MD  famotidine (PEPCID) 20 MG tablet Take 1 tablet (20 mg total) by mouth 2 (two) times daily. Patient not taking: Reported on 09/24/2021 07/18/20   Biagio Borg, MD  fluticasone-salmeterol [facility] INHUB) 250-50 MCG/ACT AEPB Inhale 1 puff into the lungs daily. 09/24/21   Sigurd Sos, MD  loratadine (CLARITIN) 10 MG tablet Take 10 mg by mouth daily as needed for  allergies. Patient not taking: Reported on 09/24/2021    [provider]  lubiprostone (AMITIZA) 24 MCG capsule Take 1 capsule (24 mcg total) by mouth 2 (two) times daily. Patient not taking: Reported on 09/24/2021 10/15/20   Thornton Park, MD  meloxicam (MOBIC) 15 MG tablet Take 1 tablet (15 mg total) by mouth daily. Patient not taking: Reported on 09/24/2021 05/29/21   Glennon Mac, DO  methocarbamol (ROBAXIN) 500 MG tablet Take 1 tablet (500 mg total) by mouth every 6 (six) hours as needed for muscle spasms. Patient not taking: Reported on 09/24/2021 07/18/21   Biagio Borg, MD  Multiple Vitamins-Calcium (ONE-A-DAY WOMENS PO) Take by mouth. Patient not taking: Reported on 09/24/2021    [provider]  norethindrone (MICRONOR) 0.35 MG tablet Take 1 tablet by mouth daily. 07/30/20   [provider]  Olopatadine HCl (PATADAY) 0.2 % SOLN Place 1 drop into both eyes daily as needed. 09/24/21   Sigurd Sos, MD  [facility] ULTRA test strip USE TO MONITOR GLUCOSE LEVELS ONCE PER DAY Patient not taking: Reported on 09/24/2021 10/16/19   Philemon Kingdom, MD  pantoprazole (PROTONIX) 40 MG tablet TAKE 1 TABLET BY MOUTH TWICE A DAY Patient not taking: Reported on 09/24/2021 03/26/21   Thornton Park, MD  sitaGLIPtin (JANUVIA) 100 MG tablet Take 1 tablet (100 mg total) by mouth daily. 03/27/21   Renato Shin, MD  vitamin B-12 (CYANOCOBALAMIN) 1000 MCG tablet Take 1,000 mcg by mouth daily as needed. Patient not taking: Reported on 09/24/2021    [provider]   Family History Family History  Problem Relation Age of Onset   Diabetes Mother    Hypertension Mother    Asthma Mother    Allergies Mother    Breast cancer Mother    Diabetes Father    Heart disease Paternal Uncle    Asthma Maternal Grandmother    Allergies Maternal Grandmother    Breast cancer Maternal Grandmother    Cancer Paternal Grandmother        Brain and Lung   Breast cancer Maternal Aunt     Colon cancer Neg Hx    Esophageal cancer Neg Hx    Stomach cancer Neg Hx    Rectal cancer Neg Hx    Social History Social History   Tobacco Use   Smoking status: Never   Smokeless tobacco: Never  Vaping Use   Vaping Use: Never used  Substance Use Topics   Alcohol use: No   Drug use: No   Allergies   Hydrocodone and Sulfonamide derivatives  Review of Systems Review of Systems Pertinent findings noted in history of present illness.   Physical Exam Triage Vital Signs ED Triage Vitals  Enc Vitals Group     BP 06/27/21 0827 (!) 147/82     Pulse Rate 06/27/21 0827 72     Resp 06/27/21 0827 18     Temp 06/27/21 0827 98.3 F (36.8 C)  Temp Source 06/27/21 0827 Oral     SpO2 06/27/21 0827 98 %     Weight --      Height --      Head Circumference --      Peak Flow --      Pain Score 06/27/21 0826 5     Pain Loc --      Pain Edu? --      Excl. in [place]? --   No data found.  Updated Vital Signs BP 119/84 (BP Location: Right Arm)    Pulse (!) 111    Temp 99.7 F (37.6 C) (Oral)    Resp 18    SpO2 97%   Physical Exam Vitals and nursing note reviewed.  Constitutional:      General: She is awake. She is not in acute distress.    Appearance: Normal appearance. She is well-developed. She is obese. She is ill-appearing. She is not toxic-appearing or diaphoretic.  HENT:     Head: Normocephalic and atraumatic.     Salivary Glands: Right salivary gland is diffusely enlarged. Right salivary gland is not tender. Left salivary gland is diffusely enlarged. Left salivary gland is not tender.     Right Ear: Hearing, ear canal and external ear normal. No drainage. No middle ear effusion. There is no impacted cerumen. Tympanic membrane is not erythematous or bulging.     Left Ear: Hearing and external ear normal. No drainage.  No middle ear effusion. There is no impacted cerumen. Tympanic membrane is not erythematous or bulging.     Ears:     Comments: Bilateral TMs bulging with  clear fluid, left TM is diffusely erythematous with mild edema.  Right canal is normal.    Nose: Nose normal. No nasal deformity, septal deviation, mucosal edema, congestion or rhinorrhea.     Right Turbinates: Not enlarged, swollen or pale.     Left Turbinates: Not enlarged, swollen or pale.     Right Sinus: No maxillary sinus tenderness or frontal sinus tenderness.     Left Sinus: No maxillary sinus tenderness or frontal sinus tenderness.     Mouth/Throat:     Lips: Pink. No lesions.     Mouth: Mucous membranes are moist. No oral lesions.     Pharynx: Oropharynx is clear. Uvula midline. No pharyngeal swelling, oropharyngeal exudate, posterior oropharyngeal erythema or uvula swelling.     Tonsils: No tonsillar exudate. 0 on the right. 0 on the left.  Eyes:     General: Lids are normal.        Right eye: No discharge.        Left eye: No discharge.     Extraocular Movements: Extraocular movements intact.     Conjunctiva/sclera:     Right eye: Right conjunctiva is injected.     Left eye: Left conjunctiva is injected.  Neck:     Trachea: Trachea and phonation normal.  Cardiovascular:     Rate and Rhythm: Normal rate and regular rhythm.     Pulses: Normal pulses.     Heart sounds: Normal heart sounds. No murmur heard.   No friction rub. No gallop.  Pulmonary:     Effort: Pulmonary effort is normal. No accessory muscle usage, prolonged expiration or respiratory distress.     Breath sounds: Normal breath sounds. No stridor, decreased air movement or transmitted upper airway sounds. No decreased breath sounds, wheezing, rhonchi or rales.  Chest:     Chest wall: No tenderness.  Musculoskeletal:  General: Normal range of motion.     Cervical back: Normal range of motion and neck supple. Normal range of motion.  Lymphadenopathy:     Cervical: Cervical adenopathy present.     Right cervical: Superficial cervical adenopathy and posterior cervical adenopathy present.     Left cervical:  Superficial cervical adenopathy and posterior cervical adenopathy present.  Skin:    General: Skin is warm and dry.     Findings: No erythema or rash.  Neurological:     General: No focal deficit present.     Mental Status: She is alert and oriented to person, place, and time.  Psychiatric:        Mood and Affect: Mood normal.        Behavior: Behavior normal. Behavior is cooperative.    Visual Acuity Right Eye Distance:   Left Eye Distance:   Bilateral Distance:    Right Eye Near:   Left Eye Near:    Bilateral Near:     UC Couse / Diagnostics / Procedures:    EKG  Radiology No results found.  Procedures Procedures (including critical care time)  UC Diagnoses / Final Clinical Impressions(s)   I have reviewed the triage vital signs and the nursing notes.  Pertinent labs & imaging results that were available during my care of the patient were reviewed by me and considered in my medical decision making (see chart for details).   Final diagnoses:  Otalgia, unspecified laterality  Rhinosinusitis  Exposure to COVID-19 virus  Viral illness   Patient advised given sudden onset of symptoms, known exposure to COVID-19 and presentation today, recommend that she begin Paxlovid now while we await the results of her COVID test which can take 24 to 48 hours.  Patient provided with renally dosed Paxlovid for a GFR of 55.  Patient also provided with Singulair given her history of asthma and Promethazine DM for cough at night. ED Prescriptions     Medication Sig Dispense Auth. Provider   nirmatrelvir/ritonavir EUA (PAXLOVID) 20 x 150 MG & 10 x 100MG  TABS Patient GFR is 55.21. Take one nirmatrelvir (150 mg) tablet and one ritonavir (100 mg) tablet twice daily for 5 days. 20 tablet Lynden Oxford Scales, PA-C   promethazine-dextromethorphan (PROMETHAZINE-DM) 6.25-15 MG/5ML syrup Take 5 mLs by mouth 4 (four) times daily as needed for cough. 180 mL Lynden Oxford Scales, PA-C   montelukast  (SINGULAIR) 10 MG tablet Take 1 tablet (10 mg total) by mouth at bedtime. 30 tablet Lynden Oxford Scales, PA-C      PDMP not reviewed this encounter.  Pending results:  Labs Reviewed  COVID-19, FLU A+B NAA    Medications Ordered in UC: Medications - No data to display  Disposition Upon Discharge:  Condition: stable for discharge home Home: take medications as prescribed; routine discharge instructions as discussed; follow up as advised.  Patient presented with an acute illness with associated systemic symptoms and significant discomfort requiring urgent management. In my opinion, this is a condition that a prudent lay person (someone who possesses an average knowledge of health and medicine) may potentially expect to result in complications if not addressed urgently such as respiratory distress, impairment of bodily function or dysfunction of bodily organs.   Routine symptom specific, illness specific and/or disease specific instructions were discussed with the patient and/or caregiver at length.   As such, the patient has been evaluated and assessed, work-up was performed and treatment was provided in alignment with urgent care protocols and evidence based  medicine.  Patient/parent/caregiver has been advised that the patient may require follow up for further testing and treatment if the symptoms continue in spite of treatment, as clinically indicated and appropriate.  If the patient was tested for COVID-19, Influenza and/or RSV, then the patient/parent/guardian was advised to isolate at home pending the results of his/her diagnostic coronavirus test and potentially longer if theyre positive. I have also advised pt that if his/her COVID-19 test returns positive, it's recommended to self-isolate for at least 10 days after symptoms first appeared AND until fever-free for 24 hours without fever reducer AND other symptoms have improved or resolved. Discussed self-isolation recommendations as  well as instructions for household member/close contacts as per the [facility] and [place] [facility], and also gave patient the [place] packet with this information.  Patient/parent/caregiver has been advised to return to the [facility] or PCP in 3-5 days if no better; to PCP or the Emergency Department if new signs and symptoms develop, or if the current signs or symptoms continue to change or worsen for further workup, evaluation and treatment as clinically indicated and appropriate  The patient will follow up with their current PCP if and as advised. If the patient does not currently have a PCP we will assist them in obtaining one.   The patient may need specialty follow up if the symptoms continue, in spite of conservative treatment and management, for further workup, evaluation, consultation and treatment as clinically indicated and appropriate.  Patient/parent/caregiver verbalized understanding and agreement of plan as discussed.  All questions were addressed during visit.  Please see discharge instructions below for further details of plan.  Discharge Instructions:   Discharge Instructions      For your known exposure to COVID-19 and your COVID-19 symptoms, please begin Paxlovid, the antiviral medication prescribed for COVID-19.  Please take 1 of each tablet in the morning and evening for the next 5 days.  I provided you with a prescription for Singulair which is a mast cell stabilizer.  It helps prevent allergic rashes and keeps inflammation in the lungs under control.  Please continue to use all of your allergy and asthma medications exactly as prescribed.  For nighttime cough, should this become a problem, I also provided you with a prescription for Promethazine DM.  You can take 5 mL 4 times daily as needed people prefer to take this just at bedtime because it can make you sleepy.  The results of your COVID test will be made available to you via MyChart in the next 12 to 24 hours.  If there is a positive  result, you will be contacted by phone.  If the result is negative, please discontinue Paxlovid.    This office note has been dictated using Museum/gallery curator.  Unfortunately, and despite my best efforts, this method of dictation can sometimes lead to occasional typographical or grammatical errors.  I apologize in advance if this occurs.     Lynden Oxford Scales, PA-C 10/06/21 [phone]

## 2021-10-06 NOTE — ED Triage Notes (Signed)
Pt reports of having ear pain (more in the right), nasal congestion, nasal drip and headache.  States coworkers might have covid.   Symptoms began last Thursday.

## 2021-10-06 NOTE — Discharge Instructions (Addendum)
For your known exposure to COVID-19 and your COVID-19 symptoms, please begin Paxlovid, the antiviral medication prescribed for COVID-19.  Please take 1 of each tablet in the morning and evening for the next 5 days.  I provided you with a prescription for Singulair which is a mast cell stabilizer.  It helps prevent allergic rashes and keeps inflammation in the lungs under control.  Please continue to use all of your allergy and asthma medications exactly as prescribed.  For nighttime cough, should this become a problem, I also provided you with a prescription for Promethazine DM.  You can take 5 mL 4 times daily as needed people prefer to take this just at bedtime because it can make you sleepy.  The results of your COVID test will be made available to you via MyChart in the next 12 to 24 hours.  If there is a positive result, you will be contacted by phone.  If the result is negative, please discontinue Paxlovid.

## 2021-10-07 LAB — COVID-19, FLU A+B NAA
Influenza A, NAA: NOT DETECTED
Influenza B, NAA: NOT DETECTED
SARS-CoV-2, NAA: NOT DETECTED

## 2021-10-10 ENCOUNTER — Other Ambulatory Visit: Payer: Self-pay

## 2021-10-10 ENCOUNTER — Ambulatory Visit: Payer: BC Managed Care – PPO | Admitting: Internal Medicine

## 2021-10-10 ENCOUNTER — Encounter: Payer: Self-pay | Admitting: Internal Medicine

## 2021-10-10 ENCOUNTER — Telehealth: Payer: Self-pay | Admitting: *Deleted

## 2021-10-10 VITALS — BP 104/60 | HR 105 | Temp 99.4°F | Ht 65.0 in | Wt 194.0 lb

## 2021-10-10 DIAGNOSIS — J4520 Mild intermittent asthma, uncomplicated: Secondary | ICD-10-CM | POA: Diagnosis not present

## 2021-10-10 DIAGNOSIS — J029 Acute pharyngitis, unspecified: Secondary | ICD-10-CM

## 2021-10-10 DIAGNOSIS — J329 Chronic sinusitis, unspecified: Secondary | ICD-10-CM | POA: Diagnosis not present

## 2021-10-10 DIAGNOSIS — J309 Allergic rhinitis, unspecified: Secondary | ICD-10-CM | POA: Diagnosis not present

## 2021-10-10 DIAGNOSIS — E1165 Type 2 diabetes mellitus with hyperglycemia: Secondary | ICD-10-CM

## 2021-10-10 LAB — POCT RAPID STREP A (OFFICE): Rapid Strep A Screen: NEGATIVE

## 2021-10-10 MED ORDER — PREDNISONE 10 MG PO TABS: ORAL_TABLET | ORAL | 0 refills | Status: DC

## 2021-10-10 MED ORDER — DOXYCYCLINE HYCLATE 100 MG PO TABS: 100.0000 mg | ORAL_TABLET | Freq: Two times a day (BID) | ORAL | 0 refills | Status: DC

## 2021-10-10 NOTE — Assessment & Plan Note (Signed)
Lab Results  Component Value Date   HGBA1C 6.8 (A) 09/05/2021   Stable, pt to continue current medical treatment jardiance

## 2021-10-10 NOTE — Assessment & Plan Note (Signed)
Mild to mod, for antibx course,  to f/u any worsening symptoms or concerns 

## 2021-10-10 NOTE — Assessment & Plan Note (Signed)
/  Mild to mod, for predpac asd,  to f/u any worsening symptoms or concerns 

## 2021-10-10 NOTE — Progress Notes (Addendum)
Patient ID: Claire Irwin, female   DOB: 12/20/1970, 51 y.o.   MRN: [id number]        Chief Complaint: follow up worsening sinus symptoms       HPI:  Claire Irwin is a 51 y.o. female  Here with over 10 days days acute onset fever, facial pain, pressure, headache, general weakness and malaise, and now greenish d/c, with mild ST and cough, but pt denies chest pain, wheezing, increased sob or doe, orthopnea, PND, increased LE swelling, palpitations, dizziness or syncope.  Was seen at [facility], and just not better with recommended ibuprofen.  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   Does have several wks ongoing nasal allergy symptoms with clearish congestion, itch and sneezing, without fever, pain, ST, cough, swelling or wheezing.       Wt Readings from Last 3 Encounters:  10/10/21 194 lb (88 kg)  09/24/21 200 lb 9.6 oz (91 kg)  09/05/21 197 lb 3.2 oz (89.4 kg)   BP Readings from Last 3 Encounters:  10/10/21 104/60  10/06/21 119/84  09/24/21 118/76         Past Medical History:  Diagnosis Date   Allergy    Asthma    Diabetes mellitus without complication (HCC)    DJD (degenerative joint disease) of knee    LEFT   Endometriosis    Fibrocystic breast disease    GERD (gastroesophageal reflux disease)    History of endometriosis    Hyperlipidemia 06/29/2011   Preeclampsia    Past Surgical History:  Procedure Laterality Date   ABDOMINAL ADHESION SURGERY  07/2004   CESAREAN SECTION  02/2004   DILATION AND CURETTAGE, DIAGNOSTIC / THERAPEUTIC     PARTIAL HYSTERECTOMY     still have ovaries    reports that she has never smoked. She has never used smokeless tobacco. She reports that she does not drink alcohol and does not use drugs. family history includes Allergies in her maternal grandmother and mother; Asthma in her maternal grandmother and mother; Breast cancer in her maternal aunt, maternal grandmother, and mother; Cancer in her paternal grandmother; Diabetes in her father and mother;  Heart disease in her paternal uncle; Hypertension in her mother. Allergies  Allergen Reactions   Hydrocodone     Pt reports it felt like she was spinning while taking.    Sulfonamide Derivatives    Current Outpatient Medications on File Prior to Visit  Medication Sig Dispense Refill   albuterol (VENTOLIN HFA) 108 (90 Base) MCG/ACT inhaler Inhale 2 puffs into the lungs every 4 (four) hours as needed for wheezing or shortness of breath. 8.5 g 2   aspirin EC 81 MG tablet Take 1 tablet (81 mg total) by mouth daily. 90 tablet 11   azelastine (ASTELIN) 0.1 % nasal spray 1-2 sprays each nostril twice daily 30 mL 5   empagliflozin (JARDIANCE) 25 MG TABS tablet Take 1 tablet (25 mg total) by mouth daily before breakfast. 90 tablet 3   loratadine (CLARITIN) 10 MG tablet Take 10 mg by mouth daily as needed for allergies.     montelukast (SINGULAIR) 10 MG tablet Take 1 tablet (10 mg total) by mouth at bedtime. 30 tablet 2   norethindrone (MICRONOR) 0.35 MG tablet Take 1 tablet by mouth daily.     Olopatadine HCl (PATADAY) 0.2 % SOLN Place 1 drop into both eyes daily as needed. 2.5 mL 5   sitaGLIPtin (JANUVIA) 100 MG tablet Take 1 tablet (100 mg total) by mouth daily.  90 tablet 3   famotidine (PEPCID) 20 MG tablet Take 1 tablet (20 mg total) by mouth 2 (two) times daily. (Patient not taking: Reported on 09/24/2021) 180 tablet 3   lubiprostone (AMITIZA) 24 MCG capsule Take 1 capsule (24 mcg total) by mouth 2 (two) times daily. (Patient not taking: Reported on 09/24/2021) 60 capsule 2   meloxicam (MOBIC) 15 MG tablet Take 1 tablet (15 mg total) by mouth daily. (Patient not taking: Reported on 09/24/2021) 30 tablet 0   methocarbamol (ROBAXIN) 500 MG tablet Take 1 tablet (500 mg total) by mouth every 6 (six) hours as needed for muscle spasms. (Patient not taking: Reported on 09/24/2021) 60 tablet 3   Multiple Vitamins-Calcium (ONE-A-DAY WOMENS PO) Take by mouth. (Patient not taking: Reported on 09/24/2021)      nirmatrelvir/ritonavir EUA (PAXLOVID) 20 x 150 MG & 10 x 100MG  TABS Patient GFR is 55.21. Take one nirmatrelvir (150 mg) tablet and one ritonavir (100 mg) tablet twice daily for 5 days. (Patient not taking: Reported on 10/10/2021) 20 tablet 0   ONETOUCH ULTRA test strip USE TO MONITOR GLUCOSE LEVELS ONCE PER DAY (Patient not taking: Reported on 09/24/2021) 50 strip 12   pantoprazole (PROTONIX) 40 MG tablet TAKE 1 TABLET BY MOUTH TWICE A DAY (Patient not taking: Reported on 09/24/2021) 180 tablet 1   promethazine-dextromethorphan (PROMETHAZINE-DM) 6.25-15 MG/5ML syrup Take 5 mLs by mouth 4 (four) times daily as needed for cough. (Patient not taking: Reported on 10/10/2021) 180 mL 0   vitamin B-12 (CYANOCOBALAMIN) 1000 MCG tablet Take 1,000 mcg by mouth daily as needed. (Patient not taking: Reported on 09/24/2021)     No current facility-administered medications on file prior to visit.        ROS:  All others reviewed and negative.  Objective        PE:  BP 104/60 (BP Location: Right Arm, Patient Position: Sitting, Cuff Size: Large)    Pulse (!) 105    Temp 99.4 F (37.4 C) (Oral)    Ht 5\' 5"  (1.651 m)    Wt 194 lb (88 kg)    SpO2 97%    BMI 32.28 kg/m                 Constitutional: Pt appears in NAD               HENT: Head: NCAT.                Right Ear: External ear normal.                 Left Ear: External ear normal.  Bilat tm's with mild erythema.  Max sinus areas mild tender.  Pharynx with mild erythema, no exudate               Eyes: . Pupils are equal, round, and reactive to light. Conjunctivae and EOM are normal               Nose: without d/c or deformity               Neck: Neck supple. Gross normal ROM               Cardiovascular: Normal rate and regular rhythm.                 Pulmonary/Chest: Effort normal and breath sounds without rales or wheezing.                Abd:  Soft,  NT, ND, + BS, no organomegaly               Neurological: Pt is alert. At baseline orientation, motor  grossly intact               Skin: Skin is warm. No rashes, no other new lesions, LE edema - none               Psychiatric: Pt behavior is normal without agitation   Micro: none  Cardiac tracings I have personally interpreted today:  none  Pertinent Radiological findings (summarize): none   Lab Results  Component Value Date   WBC 5.9 07/23/2021   HGB 14.7 07/23/2021   HCT 44.3 07/23/2021   PLT 268.0 07/23/2021   GLUCOSE 142 (H) 07/23/2021   CHOL 164 07/23/2021   TRIG 95.0 07/23/2021   HDL 62.80 07/23/2021   LDLCALC 83 07/23/2021   ALT 18 07/23/2021   AST 17 07/23/2021   NA 141 07/23/2021   K 4.1 07/23/2021   CL 106 07/23/2021   CREATININE 1.16 07/23/2021   BUN 11 07/23/2021   CO2 27 07/23/2021   TSH 0.93 07/23/2021   HGBA1C 6.8 (A) 09/05/2021   MICROALBUR <0.7 07/23/2021   Rapid Strep A Screen Negative Negative    Assessment/Plan:  LEIGHANA NEYMAN is a 51 y.o. Black or African American [2] female with  has a past medical history of Allergy, Asthma, Diabetes mellitus without complication ([place]), DJD (degenerative joint disease) of knee, Endometriosis, Fibrocystic breast disease, GERD (gastroesophageal reflux disease), History of endometriosis, Hyperlipidemia (06/29/2011), and Preeclampsia.  Sinusitis .Mild to mod, for antibx course,  to f/u any worsening symptoms or concerns  Allergic rhinitis Mild to mod, for predpac asd,  to f/u any worsening symptoms or concerns  Asthma Stable overall, to continue inhaler prn  Diabetes ([place]) Lab Results  Component Value Date   HGBA1C 6.8 (A) 09/05/2021   Stable, pt to continue current medical treatment jardiance  Followup: Return if symptoms worsen or fail to improve.  Cathlean Cower, MD 10/10/2021 2:12 PM [facility]

## 2021-10-10 NOTE — Assessment & Plan Note (Signed)
Stable overall, to continue inhaler prn 

## 2021-10-10 NOTE — Telephone Encounter (Signed)
PA started for patient [place]. Waiting on insurance determination.  GBE:[id number] KEY:[id number] FXJ:[id number] YME:[id number]

## 2021-10-10 NOTE — Telephone Encounter (Signed)
Previous documentation was documented in error. Please disregard previous documentation

## 2021-10-10 NOTE — Patient Instructions (Signed)
Please take all new medication as prescribed - the antibiotic, and prednisone  Please continue all other medications as before, and refills have been done if requested.  Please have the pharmacy call with any other refills you may need.  Please keep your appointments with your specialists as you may have planned      

## 2021-12-04 ENCOUNTER — Ambulatory Visit: Payer: BC Managed Care – PPO | Admitting: Endocrinology

## 2021-12-04 ENCOUNTER — Encounter: Payer: Self-pay | Admitting: Endocrinology

## 2021-12-04 VITALS — BP 110/70 | Ht 65.0 in | Wt 194.0 lb

## 2021-12-04 DIAGNOSIS — E1165 Type 2 diabetes mellitus with hyperglycemia: Secondary | ICD-10-CM | POA: Diagnosis not present

## 2021-12-04 LAB — POCT GLYCOSYLATED HEMOGLOBIN (HGB A1C): Hemoglobin A1C: 7.1 % — AB (ref 4.0–5.6)

## 2021-12-04 MED ORDER — RYBELSUS 7 MG PO TABS: 7.0000 mg | ORAL_TABLET | Freq: Every day | ORAL | 3 refills | Status: DC

## 2021-12-04 NOTE — Progress Notes (Signed)
? ?Subjective:  ? ? Patient ID: Claire Irwin, female    DOB: Aug 27, 1971, 51 y.o.   MRN: [id number] ? ?HPI ?Pt returns for f/u of diabetes mellitus: ?DM type: 2 ?Dx'ed: 2017 ?Complications: CRI ?Therapy: 2 oral meds ?GDM: never ?DKA: never ?Severe hypoglycemia: never.  ?Pancreatitis: never.  ?Other: she has never taken insulin; she did not tolerate farxiga (rash) or metformin (epigast pain).   ?Interval history: She has not recently checked cbg.  She takes other meds as rx'ed.   ?Past Medical History:  ?Diagnosis Date  ? Allergy   ? Asthma   ? Diabetes mellitus without complication ([place])   ? DJD (degenerative joint disease) of knee   ? LEFT  ? Endometriosis   ? Fibrocystic breast disease   ? GERD (gastroesophageal reflux disease)   ? History of endometriosis   ? Hyperlipidemia 06/29/2011  ? Preeclampsia   ? ? ?Past Surgical History:  ?Procedure Laterality Date  ? ABDOMINAL ADHESION SURGERY  07/2004  ? CESAREAN SECTION  02/2004  ? DILATION AND CURETTAGE, DIAGNOSTIC / THERAPEUTIC    ? PARTIAL HYSTERECTOMY    ? still have ovaries  ? ? ?Social History  ? ?Socioeconomic History  ? Marital status: Legally Separated  ?  Spouse name: Not on file  ? Number of children: 2  ? Years of education: Not on file  ? Highest education level: Not on file  ?Occupational History  ? Occupation: Pharmacist, hospital  ?Tobacco Use  ? Smoking status: Never  ? Smokeless tobacco: Never  ?Vaping Use  ? Vaping Use: Never used  ?Substance and Sexual Activity  ? Alcohol use: No  ? Drug use: No  ? Sexual activity: Not on file  ?Other Topics Concern  ? Not on file  ?Social History Narrative  ? Not on file  ? ?Social Determinants of Health  ? ?Financial Resource Strain: Not on file  ?Food Insecurity: Not on file  ?Transportation Needs: Not on file  ?Physical Activity: Not on file  ?Stress: Not on file  ?Social Connections: Not on file  ?Intimate Partner Violence: Not on file  ? ? ?Current Outpatient Medications on File Prior to Visit  ?Medication Sig Dispense  Refill  ? albuterol (VENTOLIN HFA) 108 (90 Base) MCG/ACT inhaler Inhale 2 puffs into the lungs every 4 (four) hours as needed for wheezing or shortness of breath. 8.5 g 2  ? aspirin EC 81 MG tablet Take 1 tablet (81 mg total) by mouth daily. 90 tablet 11  ? azelastine (ASTELIN) 0.1 % nasal spray 1-2 sprays each nostril twice daily 30 mL 5  ? doxycycline (VIBRA-TABS) 100 MG tablet Take 1 tablet (100 mg total) by mouth 2 (two) times daily. 20 tablet 0  ? empagliflozin (JARDIANCE) 25 MG TABS tablet Take 1 tablet (25 mg total) by mouth daily before breakfast. 90 tablet 3  ? loratadine (CLARITIN) 10 MG tablet Take 10 mg by mouth daily as needed for allergies.    ? montelukast (SINGULAIR) 10 MG tablet Take 1 tablet (10 mg total) by mouth at bedtime. 30 tablet 2  ? norethindrone (MICRONOR) 0.35 MG tablet Take 1 tablet by mouth daily.    ? Olopatadine HCl (PATADAY) 0.2 % SOLN Place 1 drop into both eyes daily as needed. 2.5 mL 5  ? ?No current facility-administered medications on file prior to visit.  ? ? ?Allergies  ?Allergen Reactions  ? Hydrocodone   ?  Pt reports it felt like she was spinning while taking.   ?  Sulfonamide Derivatives   ? ? ?Family History  ?Problem Relation Age of Onset  ? Diabetes Mother   ? Hypertension Mother   ? Asthma Mother   ? Allergies Mother   ? Breast cancer Mother   ? Diabetes Father   ? Heart disease Paternal Uncle   ? Asthma Maternal Grandmother   ? Allergies Maternal Grandmother   ? Breast cancer Maternal Grandmother   ? Cancer Paternal Grandmother   ?     Brain and Lung  ? Breast cancer Maternal Aunt   ? Colon cancer Neg Hx   ? Esophageal cancer Neg Hx   ? Stomach cancer Neg Hx   ? Rectal cancer Neg Hx   ? ? ?BP 110/70   Ht '5\' 5"'$  (1.651 m)   Wt 194 lb (88 kg)   BMI 32.28 kg/m?  ? ? ?Review of Systems ? ?   ?Objective:  ? Physical Exam ?VITAL SIGNS:  See vs page.   ?GENERAL: no distress ?Pulses: dorsalis pedis intact bilat.   ?MSK: no deformity of the feet.   ?CV: no leg edema.   ?Skin:   no ulcer on the feet.  normal color and temp on the feet.   ?Neuro: sensation is intact to touch on the feet.   ? ? ?Lab Results  ?Component Value Date  ? HGBA1C 7.1 (A) 12/04/2021  ? ?   ?Assessment & Plan:  ?Type 2 DM: uncontrolled ? ?Patient Instructions  ?I have sent a prescription to your pharmacy, change Januvia to Rybelsus, and: ?Please continue the same Jardiance ?check your blood sugar once a day.  vary the time of day when you check, between before the 3 meals, and at bedtime.  also check if you have symptoms of your blood sugar being too high or too low.  please keep a record of the readings and bring it to your next appointment here (or you can bring the meter itself).  You can write it on any piece of paper.  please call us sooner if your blood sugar goes below 70, or if you have a lot of readings over 200.   ?You should have a follow-up appointment in 3 months. ? ? ? ?

## 2021-12-04 NOTE — Patient Instructions (Addendum)
I have sent a prescription to your pharmacy, change Januvia to Rybelsus, and: ?Please continue the same Jardiance ?check your blood sugar once a day.  vary the time of day when you check, between before the 3 meals, and at bedtime.  also check if you have symptoms of your blood sugar being too high or too low.  please keep a record of the readings and bring it to your next appointment here (or you can bring the meter itself).  You can write it on any piece of paper.  please call us sooner if your blood sugar goes below 70, or if you have a lot of readings over 200.   ?You should have a follow-up appointment in 3 months. ? ?

## 2021-12-22 NOTE — Progress Notes (Signed)
? ?FOLLOW UP ?Date of Service/Encounter:  12/24/21 ? ? ?Subjective:  ?Claire Irwin (DOB: 1971/08/13) is a 51 y.o. female who returns to the [facility] on 12/24/2021 in re-evaluation of the following: Seasonal perennial allergic rhinitis and conjunctivitis, mild persistent asthma ?History obtained from: chart review and patient. ? ?For Review, LV was on 09/24/21  with Dr.Vishruth Seoane seen for initial visit.   ? ?Pertinent History/Diagnostics:  ?- Asthma: Mild persistent,, not controlled ? 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 - SPT environmental panel (09/24/2021): Negative SPT, positive with intradermals to dust mites, weed mix, dog and cockroach, lab test 09/24/2021 negative to environmental panel  ? - Previous CT Sinus (01/19/19): with unremarkable impression without mass or evidence of sinusitis ?Gets occasional hives. Lasts 4 to 5 days, clears with allegra.  ? ?Today presents for follow-up. ?After her allergy testing, she had tonsil swelling, ear hurting.  She started having upper respiratory illness and coughing and was started on antibiotics and steorids which cleared her up quickly.  She is now weary about allergy injections.  She is using pataday, claritin and allegra hives switching back and forth.  Takes allegra D occasionally for ear pain/pressure.  Has used it maybe 3 days in a row.  Azelastine made her feel nauseous.  She has already tried and failed Flonase. ?Her right eye will stay red.  Her eyes are mostly dry, but will occasionally be watery.  She is using olopatadine which will clear up the redness for a brief period of time but then the redness occurs.  She sometimes associates pain with the redness.  She is followed by an eye doctor, but her next appointment is not  until this summer. ? ?Her asthma varies based on how much pollen exposure she has. She uses her Wixela most days of the week.  She uses albuterol still about twice per week during pollen season.  Nighttime cough symptoms have reduced to once or twice a week with no regimen.  If she dusts her home, it is not an issue.  Has only used rescue inhaler once in the past week and a half.  ? ?Allergies as of 12/24/2021   ? ?   Reactions  ? Hydrocodone   ? Pt reports it felt like she was spinning while taking.   ? Sulfonamide Derivatives   ? ?  ? ?  ?Medication List  ?  ? ?  ? Accurate as of December 24, 2021  5:24 PM. If you have any questions, ask your nurse or doctor.  ?  ?  ? ?  ? ?STOP taking these medications   ? ?azelastine 0.1 % nasal spray ?Commonly known as: ASTELIN ?Stopped by: Sigurd Sos, MD ?  ?doxycycline 100 MG tablet ?Commonly known as: VIBRA-TABS ?Stopped by: Sigurd Sos, MD ?  ? ?  ? ?TAKE these medications   ? ?albuterol 108 (90 Base) MCG/ACT inhaler ?Commonly known as: VENTOLIN HFA ?Inhale 2 puffs into the lungs every 4 (four) hours as needed for wheezing or shortness of breath. ?  ?ALLEGRA PO ?Take by mouth. ?  ?aspirin EC 81 MG tablet ?Take 1 tablet (81 mg total) by mouth daily. ?  ?empagliflozin 25 MG Tabs tablet ?Commonly known as: Jardiance ?Take 1 tablet (25 mg total) by mouth daily before breakfast. ?  ?  loratadine 10 MG tablet ?Commonly known as: CLARITIN ?Take 10 mg by mouth daily as needed for allergies. ?  ?montelukast 10 MG tablet ?Commonly known as: Singulair ?Take 1 tablet (10 mg total) by mouth at bedtime. ?  ?norethindrone 0.35 MG tablet ?Commonly known as: MICRONOR ?Take 1 tablet by mouth daily. ?  ?Olopatadine HCl 0.2 % Soln ?Commonly known as: Pataday ?Place 1 drop into both eyes daily as needed. ?  ?Ryaltris 665-25 MCG/ACT Susp ?Generic drug: Olopatadine-Mometasone ?Place 2 sprays twice a day as needed ?Started by: Sigurd Sos, MD ?  ?Rybelsus 7 MG Tabs ?Generic drug: Semaglutide ?Take 7 mg  by mouth daily. ?  ? ?  ? ?Past Medical History:  ?Diagnosis Date  ? Allergy   ? Asthma   ? Diabetes mellitus without complication ([place])   ? DJD (degenerative joint disease) of knee   ? LEFT  ? Endometriosis   ? Fibrocystic breast disease   ? GERD (gastroesophageal reflux disease)   ? History of endometriosis   ? Hyperlipidemia 06/29/2011  ? Preeclampsia   ? ?Past Surgical History:  ?Procedure Laterality Date  ? ABDOMINAL ADHESION SURGERY  07/2004  ? CESAREAN SECTION  02/2004  ? DILATION AND CURETTAGE, DIAGNOSTIC / THERAPEUTIC    ? PARTIAL HYSTERECTOMY    ? still have ovaries  ? ?Otherwise, there have been no changes to her past medical history, surgical history, family history, or social history. ? ?ROS: All others negative except as noted per HPI.  ? ?Objective:  ?BP 104/64   Pulse 77   Temp 98.2 ?F (36.8 ?C)   Resp 16   Ht '5\' 5"'$  (1.651 m)   Wt 194 lb 4 oz (88.1 kg)   SpO2 96%   BMI 32.32 kg/m?  ?Body mass index is 32.32 kg/m?Marland Kitchen ?Physical Exam: ?General Appearance:  Alert, cooperative, no distress, appears stated age  ?Head:  Normocephalic, without obvious abnormality, atraumatic  ?Eyes:  Conjunctiva clear, EOM's intact  ?Nose: Nares normal, hypertrophic turbinates, normal mucosa, no visible anterior polyps, and septum midline  ?Throat: Lips, tongue normal; teeth and gums normal, normal posterior oropharynx, tonsils 2+, and + cobblestoning  ?Neck: Supple, symmetrical  ?Lungs:   clear to auscultation bilaterally, Respirations unlabored, no coughing  ?Heart:  regular rate and rhythm and no murmur, Appears well perfused  ?Extremities: No edema  ?Skin: Skin color, texture, turgor normal, no rashes or lesions on visualized portions of skin  ?Neurologic: No gross deficits  ? ?Assessment/Plan  ? ?Mixed rhinitis-allergic and nonallergic component: ?- allergy testing positive on intradermals to dust mites, weed mix, dog and cockroach with negative lab testing; avoidance as below ?- Start Ryaltris (combination of  steroid nasal spray with antihistamine that doesn't taste bad)-2 sprays twice a day as needed.  She has failed both azelastine and Flonase. ? - if not covered, buy nasacort and use 2 sprays twice daily ? - do not use decongestants more than three days in a row, and no more than 1-2 times per month ? -If this medication is not covered, we can try to get Truett Perna approved which is a special delivery device for fluticasone nasal spray that tends to work better than Flonase ?- Continue over the counter antihistamine daily or daily as needed; can take up to 2 daily.   ?-Your options include Claritin (Loratadine) '10mg'$ , Allegra (Fexofenadine) '180mg'$  ? ?Allergic Conjunctivitis:  ?- Continue Allergy Eye drops: great options include Pataday (Olopatadine) or Zaditor (ketotifen) for eye symptoms daily as needed-both sold over  the counter if not covered by insurance.   ?-Avoid eye drops that say red eye relief as they may contain medications that dry out your eyes. ?- call to get your eye exam sooner ? ?Mild Persistent Asthma:controlled ?- Controller Inhaler: Continue Wixela 250 1 puff once a day; This Should Be Used Everyday: can increase to 1 puff twice a day during flares for at least one week or until symptoms resolve ?- Rinse mouth out after use ?- Rescue Inhaler: Albuterol (Proair/Ventolin) 2 puffs . Use  every 4-6 hours as needed for chest tightness, wheezing, or coughing.  Can also use 15 minutes prior to exercise if you have symptoms with activity. ?- Asthma is not controlled if: ? - Symptoms are occurring >2 times a week OR ? - >2 times a month nighttime awakenings ? - You are requiring systemic steroids (prednisone/steroid injections) more than once per year ? - Your require hospitalization for your asthma. ? - Please call the clinic to schedule a follow up if these symptoms arise ? ? ?It was a pleasure seeing you again in clinic today! ? ?Follow-up in 3 months, sooner if needed.  ? ?Sigurd Sos, MD  ?[facility] ? ? ? ? ? ? ?

## 2021-12-23 IMAGING — US US BREAST*L* LIMITED INC AXILLA
1 series · 3 of 3 positions shown · non-contrast
Comparison: 07/24/2019 and earlier

CLINICAL DATA: Palpable mass in the infraclavicular region of the
LEFT breast/chest wall the last 2 months. Patient first noted mass
following her second Lovid-W1 vaccine which was in the RIGHT arm.

EXAM:
DIGITAL DIAGNOSTIC LEFT MAMMOGRAM WITH CAD AND TOMO
ULTRASOUND LEFT BREAST

[Series 1: us breast*left* limited inc axilla · 0.06mm/px · 3 of 3 slices shown]
[im 1/3]
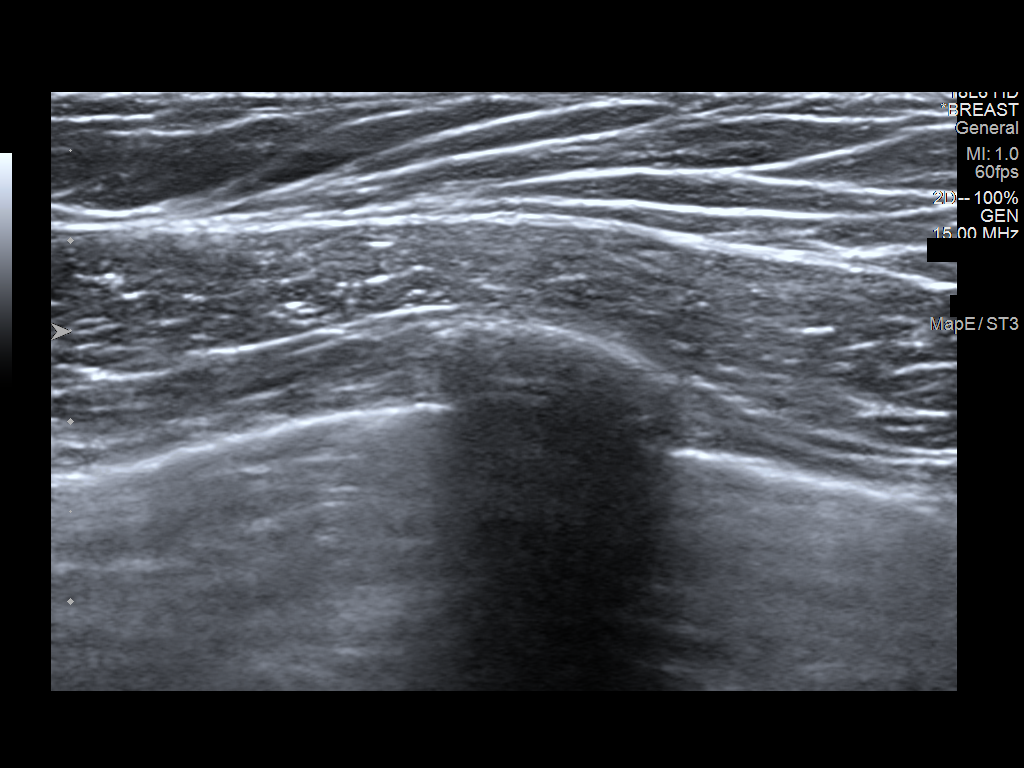
[im 2/3]
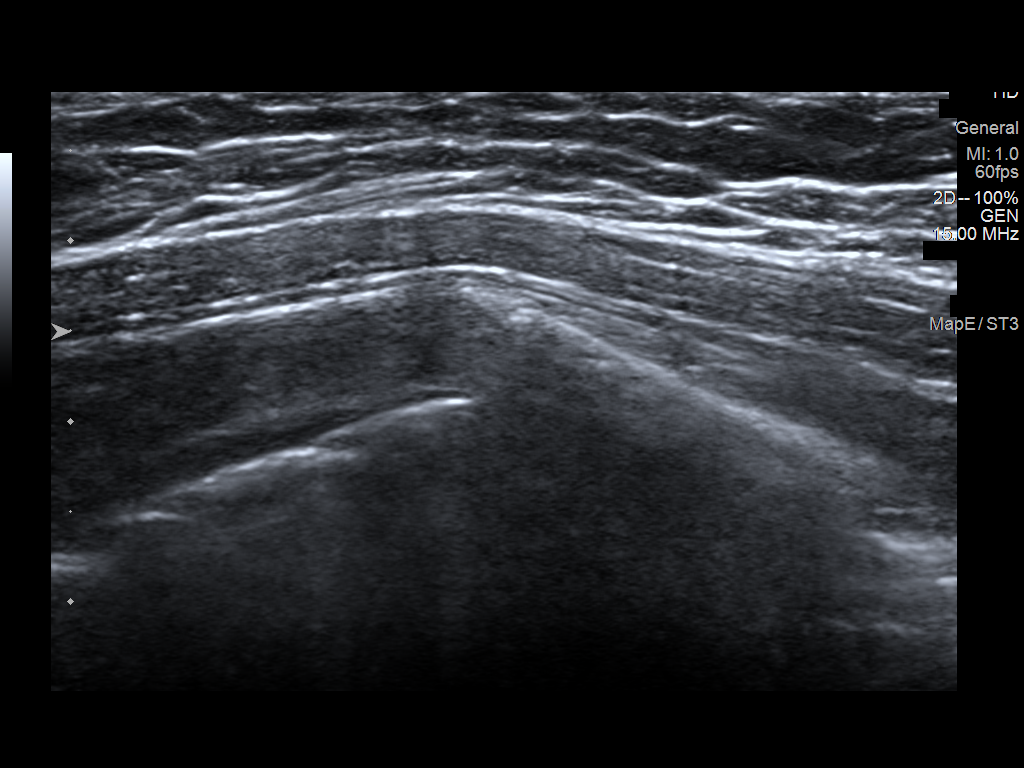
[im 3/3]
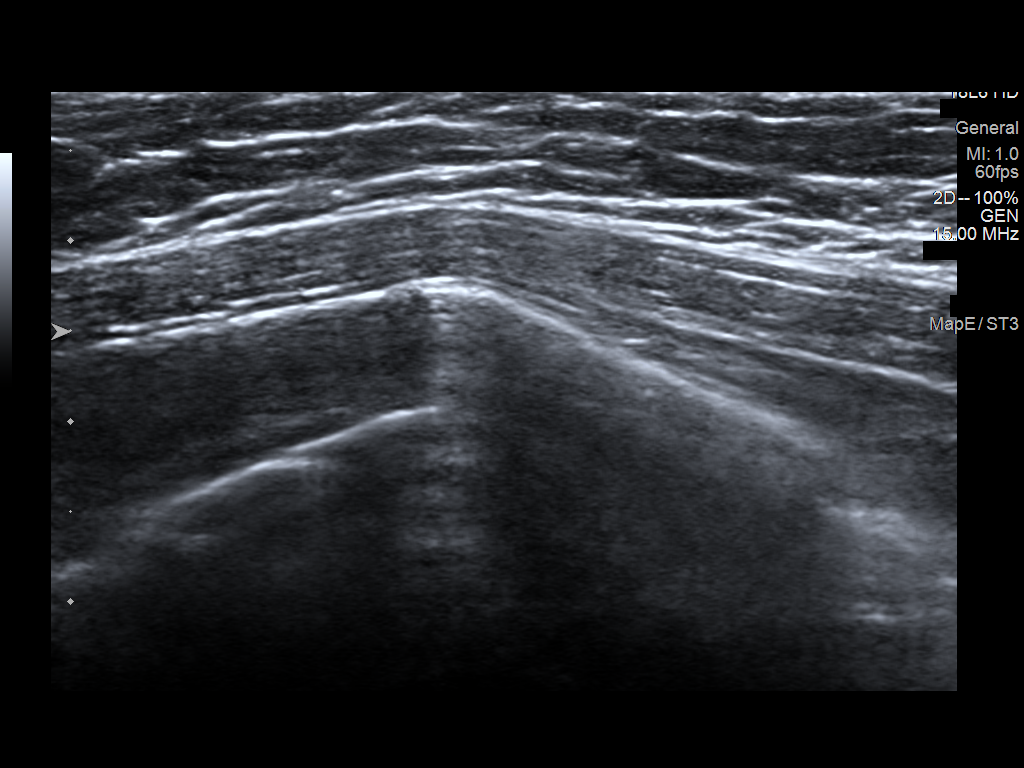

[3 of 3 positions shown; findings below may reference images not displayed]

ACR Breast Density Category b: There are scattered areas of
fibroglandular density.
FINDINGS: No suspicious mass, distortion, or microcalcifications are
identified to suggest presence of malignancy. The area of patient's
concern is high on the chest wall, not amenable to mammographic
imaging.

Mammographic images were processed with CAD.

On physical exam, I palpate soft, mildly tender prominence in the
LEFT infraclavicular region, just LATERAL to the LEFT margin of
sternum.

Targeted ultrasound is performed, showing normal appearing
fibroglandular tissue superficial to a costochondral junction,
corresponding to the area of patient's concern in the 11 o'clock
location 20 centimeters from the nipple. Patient is slightly tender
on exam of this anterior rib and the 2 adjacent anterior ribs.
IMPRESSION: 1.  No mammographic or ultrasound evidence for malignancy.
2. Palpable abnormality and focal tenderness in the LEFT breast
corresponds to a costochondral junction. Findings are consistent
with costochondritis.

RECOMMENDATION:
Consider short course of anti-inflammatory medication, as needed.

Screening mammogram is recommended in July 2020.

I have discussed the findings and recommendations with the patient.
If applicable, a reminder letter will be sent to the patient
regarding the next appointment.

BI-RADS CATEGORY  1: Negative.

## 2021-12-24 ENCOUNTER — Encounter: Payer: Self-pay | Admitting: Internal Medicine

## 2021-12-24 ENCOUNTER — Ambulatory Visit: Payer: BC Managed Care – PPO | Admitting: Internal Medicine

## 2021-12-24 VITALS — BP 104/64 | HR 77 | Temp 98.2°F | Resp 16 | Ht 65.0 in | Wt 194.2 lb

## 2021-12-24 DIAGNOSIS — H1013 Acute atopic conjunctivitis, bilateral: Secondary | ICD-10-CM | POA: Diagnosis not present

## 2021-12-24 DIAGNOSIS — J4530 Mild persistent asthma, uncomplicated: Secondary | ICD-10-CM | POA: Diagnosis not present

## 2021-12-24 DIAGNOSIS — J302 Other seasonal allergic rhinitis: Secondary | ICD-10-CM

## 2021-12-24 DIAGNOSIS — J3089 Other allergic rhinitis: Secondary | ICD-10-CM | POA: Diagnosis not present

## 2021-12-24 DIAGNOSIS — J310 Chronic rhinitis: Secondary | ICD-10-CM

## 2021-12-24 MED ORDER — RYALTRIS 665-25 MCG/ACT NA SUSP: NASAL | 5 refills | Status: AC

## 2021-12-24 NOTE — Patient Instructions (Addendum)
Mixed rhinitis-allergic and nonallergic component: ?- allergy testing positive on intradermals to dust mites, weed mix, dog and cockroach with negative lab testing; avoidance as below ?- Start Ryaltris (combination of steroid nasal spray with antihistamine that doesn't taste bad)-2 sprays twice a day as needed ? - if not covered, buy nasacort and use 2 sprays twice daily ? - do not use decongestants more than three days in a row, and no more than 1-2 times per month ?- Continue over the counter antihistamine daily or daily as needed; can take up to 2 daily.   ?-Your options include Claritin (Loratadine) '10mg'$ , Allegra (Fexofenadine) '180mg'$  ? ?Allergic Conjunctivitis:  ?- Continue Allergy Eye drops: great options include Pataday (Olopatadine) or Zaditor (ketotifen) for eye symptoms daily as needed-both sold over the counter if not covered by insurance.   ?-Avoid eye drops that say red eye relief as they may contain medications that dry out your eyes. ?- call to get your eye exam ? ?Mild Persistent Asthma:controlled ?- Controller Inhaler: Continue Wixela 250 1 puff once a day; This Should Be Used Everyday: can increase to 1 puff twice a day during flares for at least one week or until symptoms resolve ?- Rinse mouth out after use ?- Rescue Inhaler: Albuterol (Proair/Ventolin) 2 puffs . Use  every 4-6 hours as needed for chest tightness, wheezing, or coughing.  Can also use 15 minutes prior to exercise if you have symptoms with activity. ?- Asthma is not controlled if: ? - Symptoms are occurring >2 times a week OR ? - >2 times a month nighttime awakenings ? - You are requiring systemic steroids (prednisone/steroid injections) more than once per year ? - Your require hospitalization for your asthma. ? - Please call the clinic to schedule a follow up if these symptoms arise ? ? ?It was a pleasure seeing you again in clinic today! ? ?Follow-up in 3 months, sooner if needed.  ? ?Claire Sos, MD ?[facility] ? ? ?DUST MITE AVOIDANCE MEASURES: ? ?There are three main measures that need and can be taken to avoid house dust mites: ? ?Reduce accumulation of dust in general ?-reduce furniture, clothing, carpeting, books, stuffed animals, especially in bedroom ? ?Separate yourself from the dust ?-use pillow and mattress encasements (can be found at stores such as [employer] or online) ?-avoid direct exposure to air condition flow ?-use a HEPA filter device, especially in the bedroom; you can also use a HEPA filter vacuum cleaner ?-wipe dust with a moist towel instead of a dry towel or broom when cleaning ? ?Decrease mites and/or their secretions ?-wash clothing and linen and stuffed animals at highest temperature possible, at least every 2 weeks ?-stuffed animals can also be placed in a bag and put in a freezer overnight ? ?Despite the above measures, it is impossible to eliminate dust mites or their allergen completely from your home.  With the above measures the burden of mites in your home can be diminished, with the goal of minimizing your allergic symptoms.  Success will be reached only when implementing and using all means together. ?Control of Cockroach Allergen ? ?Cockroach allergen has been identified as an important cause of acute attacks of asthma, especially in urban settings.  There are fifty-five species of cockroach that exist in the [place], however only three, the [place], [place] species produce allergen that can affect patients with Asthma.  Allergens can be obtained from fecal particles, egg casings  and secretions from cockroaches. ?   ?Remove food sources. ?Reduce access to water. ?Seal access and entry points. ?Spray runways with 0.5-1% Diazinon or Chlorpyrifos ?Blow boric acid power under stoves and refrigerator. ?Place bait stations (hydramethylnon) at feeding sites. ?Control of Dog or Cat Allergen ? ?Avoidance is the best way to manage a dog or cat allergy. If you have  a dog or cat and are allergic to dog or cats, consider removing the dog or cat from the home. ?If you have a dog or cat but don?t want to find it a new home, or if your family wants a pet even though someone in the household is allergic, here are some strategies that may help keep symptoms at bay: ? ?Keep the pet out of your bedroom and restrict it to only a few rooms. Be advised that keeping the dog or cat in only one room will not limit the allergens to that room. ?Don?t pet, hug or kiss the dog or cat; if you do, wash your hands with soap and water. ?High-efficiency particulate air (HEPA) cleaners run continuously in a bedroom or living room can reduce allergen levels over time. ?Regular use of a high-efficiency vacuum cleaner or a central vacuum can reduce allergen levels. ?Giving your dog or cat a bath at least once a week can reduce airborne allergen. ? ? ?

## 2021-12-29 ENCOUNTER — Other Ambulatory Visit: Payer: Self-pay | Admitting: Internal Medicine

## 2022-02-11 ENCOUNTER — Ambulatory Visit (INDEPENDENT_AMBULATORY_CARE_PROVIDER_SITE_OTHER): Payer: BC Managed Care – PPO

## 2022-02-11 ENCOUNTER — Encounter: Payer: Self-pay | Admitting: Podiatry

## 2022-02-11 ENCOUNTER — Ambulatory Visit: Payer: BC Managed Care – PPO | Admitting: Podiatry

## 2022-02-11 DIAGNOSIS — M7752 Other enthesopathy of left foot: Secondary | ICD-10-CM

## 2022-02-11 MED ORDER — TRIAMCINOLONE ACETONIDE 10 MG/ML IJ SUSP
10.0000 mg | Freq: Once | INTRAMUSCULAR | Status: AC
  Administered 2022-02-11: 10 mg

## 2022-02-11 NOTE — Progress Notes (Signed)
Subjective:   Patient ID: Claire Irwin, female   DOB: 51 y.o.   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

## 2022-03-17 ENCOUNTER — Telehealth: Payer: Self-pay | Admitting: Gastroenterology

## 2022-03-17 NOTE — Telephone Encounter (Signed)
Patient called in with complaints of intermittent, LUQ abdominal pain (5/10), constipation, and nausea. A couple of weeks ago she increased red meat in her diet, and since then it seems like food is "settling at the top" of her stomach after she eats. She is still able to hold down food/fluids. She had a bowel movements today, she has been taking milk of mag daily or every other day. Patient has been scheduled for a follow up tomorrow with Nevin Bloodgood, NP at 10:00 am. Patient advised to eat small, frequent meals & increase fluids in the meantime. Patient verbalized all understanding, no further questions.

## 2022-03-17 NOTE — Telephone Encounter (Signed)
Patient called said she would like to speak with a nurse about possibly having diverticulitis.

## 2022-03-18 ENCOUNTER — Encounter: Payer: Self-pay | Admitting: Nurse Practitioner

## 2022-03-18 ENCOUNTER — Ambulatory Visit: Payer: BC Managed Care – PPO | Admitting: Nurse Practitioner

## 2022-03-18 VITALS — BP 106/80 | HR 80 | Resp 16 | Ht 65.0 in | Wt 195.0 lb

## 2022-03-18 DIAGNOSIS — R1013 Epigastric pain: Secondary | ICD-10-CM | POA: Diagnosis not present

## 2022-03-18 DIAGNOSIS — K5900 Constipation, unspecified: Secondary | ICD-10-CM

## 2022-03-18 MED ORDER — PANTOPRAZOLE SODIUM 40 MG PO TBEC: 40.0000 mg | DELAYED_RELEASE_TABLET | Freq: Every day | ORAL | 3 refills | Status: DC

## 2022-03-18 MED ORDER — LINACLOTIDE 290 MCG PO CAPS: 290.0000 ug | ORAL_CAPSULE | Freq: Every day | ORAL | 5 refills | Status: DC

## 2022-03-18 NOTE — Progress Notes (Signed)
Chief Complaint:  upper abdominal pain    Assessment &  Plan   # 51 yo female with recurrent epigastric pain with radiation into chest and increased belching.  Previous work-up has included negative abdominal ultrasound and EGD with findings of chronic gastritis (non-H. pylori related ).  She is no longer on PPI therapy.  Pain secondary to gastritis? She takes Semaglutide which can cause abdominal pain in some patients. However, she describes this as being the same pain as what she had in February 2022 and it appears she was not on semaglutide at that time.  Functional dyspepsia? For time being she will hold off on taking the three supplements she recently started. They probably are not contributing to symptoms but cannot know for sure.  Resume pantoprazole 40 mg 30 minutes before breakfast.  Not taking FD guard suggested at her last visit as she feels it worsened GERD symptoms Follow up with me or Dr. Tarri Glenn in  4-6 weeks. Call sooner for worsening symptoms.  If no improvement consider holding semaglutide and / or trial of TCA for possible functional dyspepsia   # Chronic constipation.  She is up-to-date on colonoscopy Linzess works but has to time dose with work schedule so cannot take it everyday. Currently having good results with QOD MOM.  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 Refills given.    HPI   Sianne is a 51 year old female known to Dr. Tarri Glenn.  She has a PMH of type 2 diabetes, GERD, iron deficiency anemia, diverticulosis. Previously evaluated here for chronic constipation, upper abdominal pain, chronic gastritis without H. Pylori,  iron deficiency anemia,  history of colon polyps.    At the time of her last visit here February 2022 for upper abdominal pain and constipation she was continued on pantoprazole, given a trial of FDgard, continued on Linzess to 290 mcg daily and Amitiza 24 mcg twice daily.  She was  scheduled for an EGD and an abdominal ultrasound for evaluation of upper abdominal pain.  Abdominal ultrasound was unremarkable.  EGD with biopsy showed mild reflux esophagitis  and chronic gastritis without H. Pylori.   Interval History:  She tried the [place] but it her made her reflux worse. She is no longer taking reflux medication except for tums for occasional symptoms. Regarding constipation, it appears from Dr. Payton Emerald last note on 10/15/21 that she intended for patient to take both the linzess and Amitiza. Patient didn't take the Amitiza and Linzess together. 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 MOM works the best but she also has to time it based on her schedule. She is generally taking MOM about QOD and pleased with results.   Georgiana is here today for evaluation of recurrent epigastric pain. This is same pain previously evaluated in 2022. The pain radiates upward into her chest. She describes it as pressure and also sharp. The pain seems worse about an hour after eating. She ad been doing well until around the  4th of July. She consumed a lot of red meat at that time and thinks that contributed to recurrence of the pain. She has also been having increased belching and flatus. She is not taking pantoprazole on a daily basis because her reflux hasn't been active. If she does get reflux symptoms then she takes Tums which help.    She is  taking probiotics, digestive enzymes and "bloom" greens & superfood. She recently started these three supplements.     Previous GI Evaluation   February 2020 abdominal ultrasound Negative.  No gallstones.  No gallbladder wall thickening.  CBD 3 mm.  December 2020 marker study  No retained rings  February 2021 screening colonoscopy --Nonbleeding internal hemorrhoids.  Left-sided diverticulosis, a 1 mm polyp removed from the descending colon.  A less than 1 mm polyp removed from the cecum.  Exam otherwise  normal --Polyp pathology compatible with a tubular adenoma.  No high-grade dysplasia.  A 5-year interval colonoscopy was recommended given her family history of colon polyps  April 2022 EGD for evaluation of upper abdominal pain, GERD, iron deficiency  -- Mild esophagitis.  Remainder of esophagus was normal.  Mild inflammation, erythema and friability in the gastric antrum.  Duodenum was normal. -- Mid and proximal esophageal biopsy showed benign squamous mucosa, no increased intraepithelial eosinophils.  No intestinal metaplasia.  Distal esophageal biopsies showed changes of reflux.  No intestinal metaplasia or dysplasia.  Gastric fundus biopsies showed mild chronic gastritis.  No H. pylori.  Gastric body biopsies compatible with mild chronic gastritis.  No H. pylori.  Antral biopsies showed chronic gastritis.  No intestinal metaplasia or dysplasia.  Duodenal biopsy showed benign small bowel mucosa.  No villous blunting   Imaging   Labs:     Latest Ref Rng & Units 07/23/2021    9:06 AM 08/27/2020    2:26 PM 02/13/2020    2:41 PM  CBC  WBC 4.0 - 10.5 K/uL 5.9  7.8  5.8   Hemoglobin 12.0 - 15.0 g/dL 14.7  15.0  14.6   Hematocrit 36.0 - 46.0 % 44.3  44.5  43.1   Platelets 150.0 - 400.0 K/uL 268.0  312.0  308.0        Latest Ref Rng & Units 07/23/2021    9:06 AM 08/27/2020    2:26 PM 02/13/2020    2:41 PM  Hepatic Function  Total Protein 6.0 - 8.3 g/dL 7.4  7.6  7.4   Albumin 3.5 - 5.2 g/dL 4.3  4.2  4.3   AST 0 - 37 U/L _0 ALT 0 - 35 U/L _1 Alk Phosphatase 39 - 117 U/L 82  107  89   Total Bilirubin 0.2 - 1.2 mg/dL 0.7  0.3  0.5   Bilirubin, Direct 0.0 - 0.3 mg/dL 0.2        Past Medical History:  Diagnosis Date   Allergy    Asthma    Diabetes mellitus without complication (HCC)    DJD (degenerative joint disease) of knee    LEFT   Endometriosis    Fibrocystic breast disease    GERD (gastroesophageal reflux disease)    History of endometriosis     Hyperlipidemia 06/29/2011   Preeclampsia     Past Surgical History:  Procedure Laterality Date   ABDOMINAL ADHESION SURGERY  07/2004   CESAREAN SECTION  02/2004   DILATION AND CURETTAGE, DIAGNOSTIC / THERAPEUTIC     PARTIAL HYSTERECTOMY     still have ovaries    Current Medications, Allergies, Family History and Social History were reviewed in [employer] record.     Current Outpatient Medications  Medication Sig Dispense Refill   albuterol (VENTOLIN HFA) 108 (90 Base) MCG/ACT inhaler Inhale 2 puffs into the lungs every 4 (four) hours as needed for wheezing or  shortness of breath. 8.5 g 2   aspirin EC 81 MG tablet Take 1 tablet (81 mg total) by mouth daily. 90 tablet 11   empagliflozin (JARDIANCE) 25 MG TABS tablet Take 1 tablet (25 mg total) by mouth daily before breakfast. 90 tablet 3   Fexofenadine HCl (ALLEGRA PO) Take by mouth.     loratadine (CLARITIN) 10 MG tablet Take 10 mg by mouth daily as needed for allergies.     montelukast (SINGULAIR) 10 MG tablet Take 1 tablet (10 mg total) by mouth at bedtime. (Patient not taking: Reported on 12/24/2021) 30 tablet 2   norethindrone (MICRONOR) 0.35 MG tablet Take 1 tablet by mouth daily.     Olopatadine HCl (PATADAY) 0.2 % SOLN Place 1 drop into both eyes daily as needed. 2.5 mL 5   Olopatadine-Mometasone (RYALTRIS) 665-25 MCG/ACT SUSP Place 2 sprays twice a day as needed 29 g 5   ONETOUCH ULTRA test strip USE TO MONITOR GLUCOSE LEVELS ONCE PER DAY 100 strip 3   Semaglutide (RYBELSUS) 7 MG TABS Take 7 mg by mouth daily. 30 tablet 3   No current facility-administered medications for this visit.    Review of Systems: No fevers. No shortness of breath. No urinary complaints.    Physical Exam  Wt Readings from Last 3 Encounters:  12/24/21 194 lb 4 oz (88.1 kg)  12/04/21 194 lb (88 kg)  10/10/21 194 lb (88 kg)    BP 106/80 (BP Location: Right Arm, Patient Position: Sitting, Cuff Size: Normal)   Pulse 80    Resp 16   Ht _0  (1.651 m)   Wt 195 lb (88.5 kg)   SpO2 96%   BMI 32.45 kg/m  Constitutional:  Generally well appearing female in no acute distress. Psychiatric: Pleasant. Normal mood and affect. Behavior is normal. EENT: Pupils normal.  Conjunctivae are normal. No scleral icterus. Neck supple.  Cardiovascular: Normal rate, regular rhythm. No edema Pulmonary/chest: Effort normal and breath sounds normal. No wheezing, rales or rhonchi. Abdominal: Soft, nondistended, mild epigastric tenderness. Bowel sounds active throughout. There are no masses palpable. No hepatomegaly. Neurological: Alert and oriented to person place and time. Skin: Skin is warm and dry. No rashes noted.  Tye Savoy, NP  03/18/2022, 8:29 AM

## 2022-03-18 NOTE — Patient Instructions (Addendum)
We have sent the following medications to your pharmacy for you to pick up at your convenience: Linzess 290 mcg daily before breakfast. Pantoprazole 40 mg daily 30 minutes before breakfast.   Stop supplements.   Follow up with Dr. Tarri Glenn in 4-6 weeks.   If you are age 51 or older, your body mass index should be between 23-30. Your Body mass index is 32.45 kg/m. If this is out of the aforementioned range listed, please consider follow up with your Primary Care Provider.  If you are age 39 or younger, your body mass index should be between 19-25. Your Body mass index is 32.45 kg/m. If this is out of the aformentioned range listed, please consider follow up with your Primary Care Provider.   ________________________________________________________  The [facility] providers would like to encourage you to use [facility] to communicate with providers for non-urgent requests or questions.  Due to long hold times on the telephone, sending your provider a message by [facility] may be a faster and more efficient way to get a response.  Please allow 48 business hours for a response.  Please remember that this is for non-urgent requests.  _______________________________________________________

## 2022-03-19 NOTE — Progress Notes (Signed)
Reviewed and agree with management plans. ? ?Leighton Luster L. Adaeze Better, MD, MPH  ?

## 2022-04-23 ENCOUNTER — Ambulatory Visit: Payer: BC Managed Care – PPO | Admitting: Gastroenterology

## 2022-04-23 ENCOUNTER — Encounter: Payer: Self-pay | Admitting: Gastroenterology

## 2022-04-23 VITALS — BP 100/70 | HR 80 | Ht 65.25 in | Wt 195.4 lb

## 2022-04-23 DIAGNOSIS — R1013 Epigastric pain: Secondary | ICD-10-CM

## 2022-04-23 DIAGNOSIS — K5900 Constipation, unspecified: Secondary | ICD-10-CM | POA: Diagnosis not present

## 2022-04-23 MED ORDER — MOTEGRITY 2 MG PO TABS: 2.0000 mg | ORAL_TABLET | Freq: Every day | ORAL | 5 refills | Status: DC

## 2022-04-23 NOTE — Progress Notes (Signed)
Referring Provider: Biagio Borg, MD Primary Care Physician:  Biagio Borg, MD  Chief complaint:  Constipation, abdominal pain   IMPRESSION:  Upper abdominal pain with associated bleching Chronic constipation without alarm features    - normal TSH and calcium    - incompletely relieved Linzess, Miralax, stool softeners, milk of mangesium    - using magnesium citrate for rescue therapy    - SitzMark study showed no retained rings Iron deficiency with anemia noted on 12/21 Reflux despite multiple therapeutic attempts History of colon polyps    - 2 tubular adenomas on colonoscopy 10/11/19    - surveillance colonoscopy recommended 2028 No known family history of colon cancer or polyps  Chronic constipation not responding to laxative therapy without alarm features: Trial of Amitiza instead of Linzess 290 mcg daily was unsuccessful, but, using the two together provides some relief. Given her concurrent upper GI symptoms, I think she would be a good candidate for Motegrity. Consider referral for pelvic floor PT, although SitzMark study showed no retained stools.   Upper abdominal pain: May be related to constipation, although she is unable to correlated the two. Sitz Mark showed significant stool in the right colon, although no retained rings. Must also consider functional dyspepsia. If not improving with improved constipation, will consider trial of TCA or even discontinuation of semaglutide.   Reflux: Reviewed lifestyle modifications. Pantoprazole for now with goal to taper off as symptoms improve.   History of colon polyps: Reviewed pathology results. Surveillance colonoscopy due 2028.    PLAN: - Continue Pantoprazole 40 mg QAM - Continue Linzess 290 mcg daily and Amitiza 24 mcg BID - If insurance will cover Motegrity 50m daily will use this instead of Linzess and Amitizia - See patient instructions for significant dietary recommendations including magnesium oxide - Office  follow-up in 6-8 weeks, earlier if needed  Please see the "Patient Instructions" section for addition details about the plan.  HPI: Claire BENTSENis a 51y.o. female who returns in follow-up. Initially seen in consultation for constipation 08/24/19. Had a colonoscopy 10/11/19. Most recently seen by [name]7/19/23 for upper abdominal pain and constipation. She returns today in scheduled follow-up. She has diabetes, asthma, endometriosis, and a history of a partial hysterectomy 10 years ago.  On previous visits she reported several years of altered bowel habits.  She noticed a change in bowel habits after she was diagnosed with diabetes several years ago. Bowel movement 3-4 times a week with varied consistency in the stool.  Associated bloating.  Some straining  and a sensation of incomplete evacuation. No use of digital maneuvers. No sensation of anorectal obstruction or blockage with 25 percent of bowel movements.  No blood or mucus.  No abdominal pain.   She reported ongoing constipation and a sense of incomplete evacuation despite Linzess, Amitiza 24 mc BID, Miralax, stool softeners, milk of mangesium, and magnesium citrate for rescue therapy.  Linzess increased to 290 daily improved the constipation but she did not like the severe diarrhea. Using the Linzess on weekends to minimize symptoms. Amitiza did not provide complete relief.   Also has a lifetime history of reflux. Taking omeprazole, Pepcid, and then protonix as she finds that she has to keep switching them to control her symptoms.  Takes medication most days but not consistently.  Trial of FDGard worsened her reflux.  Returns in follow-up today.  Continues to have some upper abdominal pain but not as severe as before. She stopped taking  all reflux medications.  Having a bowel movement 2-3 times a week.   Feels abdominal pressure in her epigastrium. Improves with defecation.   She has stopped using supplements without any  significant change.  Evaluation as included:  - Abdominal ultrasound 2/20202: normal - Sitz mark study 08/29/19: No retained marker, stool mainly in the ascending colon - Colonoscopy 10/11/19: 2 tubular adenomas, internal hmoerrhoids, left-sided diverticulosis - Abdominal ultrasound 10/22/20: normal - EGD 12/18/20: mild reflux esophagitis and chronic gastritis without H pylori - normal TSH and calcium - Normal CBC, CMP, ESR   Past Medical History:  Diagnosis Date   Allergy    Asthma    Diabetes mellitus without complication (HCC)    DJD (degenerative joint disease) of knee    LEFT   Endometriosis    Fibrocystic breast disease    GERD (gastroesophageal reflux disease)    History of endometriosis    Hyperlipidemia 06/29/2011   Preeclampsia     Past Surgical History:  Procedure Laterality Date   ABDOMINAL ADHESION SURGERY  07/2004   CESAREAN SECTION  02/2004   DILATION AND CURETTAGE, DIAGNOSTIC / THERAPEUTIC     PARTIAL HYSTERECTOMY     still have ovaries    Current Outpatient Medications  Medication Sig Dispense Refill   ADVAIR DISKUS 250-50 MCG/ACT AEPB Inhale 1 puff into the lungs daily.     albuterol (VENTOLIN HFA) 108 (90 Base) MCG/ACT inhaler Inhale 2 puffs into the lungs every 4 (four) hours as needed for wheezing or shortness of breath. 8.5 g 2   aspirin EC 81 MG tablet Take 1 tablet (81 mg total) by mouth daily. 90 tablet 11   DIGESTIVE ENZYMES PO Take 1 capsule by mouth daily. (Patient not taking: Reported on 05/14/2022)     Fexofenadine HCl (ALLEGRA PO) Take by mouth.     linaclotide (LINZESS) 290 MCG CAPS capsule Take 1 capsule (290 mcg total) by mouth daily before breakfast. (Patient not taking: Reported on 05/14/2022) 30 capsule 5   loratadine (CLARITIN) 10 MG tablet Take 10 mg by mouth daily as needed for allergies.     Magnesium Hydroxide (MILK OF MAGNESIA PO) Take 1 Dose by mouth as needed.     montelukast (SINGULAIR) 10 MG tablet Take 1 tablet (10 mg total) by  mouth at bedtime. (Patient taking differently: Take 10 mg by mouth as needed.) 30 tablet 2   norethindrone (MICRONOR) 0.35 MG tablet Take 1 tablet by mouth daily.     Olopatadine HCl (PATADAY) 0.2 % SOLN Place 1 drop into both eyes daily as needed. (Patient not taking: Reported on 05/14/2022) 2.5 mL 5   Olopatadine-Mometasone (RYALTRIS) 665-25 MCG/ACT SUSP Place 2 sprays twice a day as needed (Patient not taking: Reported on 05/14/2022) 29 g 5   ONETOUCH ULTRA test strip USE TO MONITOR GLUCOSE LEVELS ONCE PER DAY 100 strip 3   pantoprazole (PROTONIX) 40 MG tablet Take 1 tablet (40 mg total) by mouth daily. 90 tablet 3   Probiotic Product (PROBIOTIC DAILY PO) Take 1 capsule by mouth daily. (Patient not taking: Reported on 05/14/2022)     Prucalopride Succinate (MOTEGRITY) 2 MG TABS Take 1 tablet (2 mg total) by mouth daily. 30 tablet 5   Triamcinolone Acetonide (NASACORT ALLERGY 24HR NA) Place 1 spray into the nose daily. (Patient not taking: Reported on 05/14/2022)     empagliflozin (JARDIANCE) 25 MG TABS tablet Take 1 tablet (25 mg total) by mouth daily before breakfast. 90 tablet 3   Semaglutide (RYBELSUS)   7 MG TABS Take 7 mg by mouth daily. 90 tablet 3   No current facility-administered medications for this visit.    Allergies as of 04/23/2022 - Review Complete 04/23/2022  Allergen Reaction Noted   Hydrocodone  02/21/2010   Sulfonamide derivatives      Family History  Problem Relation Age of Onset   Diabetes Mother    Hypertension Mother    Asthma Mother    Allergies Mother    Breast cancer Mother    Diabetes Father    Heart disease Paternal Uncle    Asthma Maternal Grandmother    Allergies Maternal Grandmother    Breast cancer Maternal Grandmother    Cancer Paternal Grandmother        Brain and Lung   Breast cancer Maternal Aunt    Colon cancer Neg Hx    Esophageal cancer Neg Hx    Stomach cancer Neg Hx    Rectal cancer Neg Hx      Physical Exam: General:   Alert,   well-nourished, pleasant and cooperative in NAD Head:  Normocephalic and atraumatic. Eyes:  Sclera clear, no icterus.   Conjunctiva pink. Abdomen:  Soft, nontender, nondistended, normal bowel sounds, no rebound or guarding. No hepatosplenomegaly.   Rectal:  Deferred  Msk:  Symmetrical. No boney deformities LAD: No inguinal or umbilical LAD Extremities:  No clubbing or edema. Neurologic:  Alert and  oriented x4;  grossly nonfocal Skin:  Intact without significant lesions or rashes. Psych:  Alert and cooperative. Normal mood and affect.   Kimberly L. Beavers, MD, MPH 05/25/2022, 3:40 AM    

## 2022-04-23 NOTE — Patient Instructions (Addendum)
It was my pleasure to provide care to you today. Based on our discussion, I am providing you with my recommendations below:  RECOMMENDATION(S):    I recommend that you eat at least 25-30 grams of fiber daily and drink at least 64 ounces of water daily. You will want to gradually increase the fiber in your diet to avoid bloating. You may increase the fiber through diet and through fiber supplements including psyllium and methycellulose.   Natural laxatives include prunes, apples, apricots, cherries, peaches, pears, aloe, rhubarb, kiwi, bananas, mango, papaya, and watermelon. In particular, two kiwi a day has been show to cause less likely to cause bloating than prunes or psyllium.  As long as you have healthy kidneys, another options is using magnesium oxide supplements. I recommend starting with 500 mg daily. You could increase the dose to 1000 mg daily after one week if that doesn't seem to be helping.   I am hoping that your insurance will cover Motegrity. If so, we would use this instead of Linzess and Amitiza.   We briefly discussed eating at least 30 different plant based foods each week to try to keep the bacteria in your gut.   FOLLOW UP:  I would like for you to follow up with me in 6-8 weeks. Please call the office at [phone] to schedule your appointment.  BMI:  If you are age 18 or older, your body mass index should be between 23-30. Your Body mass index is 32.26 kg/m. If this is out of the aforementioned range listed, please consider follow up with your Primary Care Provider.  If you are age 43 or younger, your body mass index should be between 19-25. Your Body mass index is 32.26 kg/m. If this is out of the aformentioned range listed, please consider follow up with your Primary Care Provider.   MY CHART:  The [facility] providers would like to encourage you to use [facility] to communicate with providers for non-urgent requests or questions.  Due to long hold times on the  telephone, sending your provider a message by [facility] may be a faster and more efficient way to get a response.  Please allow 48 business hours for a response.  Please remember that this is for non-urgent requests.   Thank you for trusting me with your gastrointestinal care!    Thornton Park, MD, MPH

## 2022-04-24 ENCOUNTER — Other Ambulatory Visit: Payer: Self-pay | Admitting: Gastroenterology

## 2022-04-27 NOTE — Telephone Encounter (Signed)
Please submit PA for Motegrity.

## 2022-04-28 ENCOUNTER — Telehealth: Payer: Self-pay | Admitting: Pharmacy Technician

## 2022-04-28 ENCOUNTER — Other Ambulatory Visit (HOSPITAL_COMMUNITY): Payer: Self-pay

## 2022-04-28 NOTE — Telephone Encounter (Signed)
Patient Advocate Encounter  Received notification that prior authorization for MOTEGRITY '2MG'$  is required.   PA submitted on 8.29.23 Key [id number] Status is pending    Luciano Cutter, CPhT Patient Advocate Phone: [phone]

## 2022-04-28 NOTE — Telephone Encounter (Signed)
PA has been submitted and telephone encounter has been created 

## 2022-04-29 ENCOUNTER — Other Ambulatory Visit (HOSPITAL_COMMUNITY): Payer: Self-pay

## 2022-04-29 NOTE — Telephone Encounter (Signed)
Left message for patient to call office.  

## 2022-04-29 NOTE — Telephone Encounter (Signed)
Patient Advocate Encounter  Prior Authorization for MOTEGRITY '2MG'$  has been approved.    PA# [id number] Effective dates: 8.29.23 through 8.29.24  Ellora Varnum B. CPhT P: [phone] F: [phone]

## 2022-04-30 NOTE — Telephone Encounter (Signed)
Patient informed that Motegrity is approved.

## 2022-05-14 ENCOUNTER — Ambulatory Visit: Payer: BC Managed Care – PPO | Admitting: Internal Medicine

## 2022-05-14 ENCOUNTER — Encounter: Payer: Self-pay | Admitting: Internal Medicine

## 2022-05-14 VITALS — BP 120/72 | HR 80 | Ht 65.25 in | Wt 195.8 lb

## 2022-05-14 DIAGNOSIS — E1165 Type 2 diabetes mellitus with hyperglycemia: Secondary | ICD-10-CM | POA: Diagnosis not present

## 2022-05-14 LAB — POCT GLYCOSYLATED HEMOGLOBIN (HGB A1C): Hemoglobin A1C: 7.3 % — AB (ref 4.0–5.6)

## 2022-05-14 MED ORDER — EMPAGLIFLOZIN 25 MG PO TABS: 25.0000 mg | ORAL_TABLET | Freq: Every day | ORAL | 3 refills | Status: DC

## 2022-05-14 MED ORDER — RYBELSUS 7 MG PO TABS: 7.0000 mg | ORAL_TABLET | Freq: Every day | ORAL | 3 refills | Status: DC

## 2022-05-14 NOTE — Patient Instructions (Addendum)
STOP Januvia  Start Rybelsus 3 mg , 1 tablet every morning for a month follow by 7 mg tablets daily  Continue Jardiance 25 mg daily    HOW TO TREAT LOW BLOOD SUGARS (Blood sugar LESS THAN 70 MG/DL) Please follow the RULE OF 15 for the treatment of hypoglycemia treatment (when your (blood sugars are less than 70 mg/dL)   STEP 1: Take 15 grams of carbohydrates when your blood sugar is low, which includes:  3-4 GLUCOSE TABS  OR 3-4 OZ OF JUICE OR REGULAR SODA OR ONE TUBE OF GLUCOSE GEL    STEP 2: RECHECK blood sugar in 15 MINUTES STEP 3: If your blood sugar is still low at the 15 minute recheck --> then, go back to STEP 1 and treat AGAIN with another 15 grams of carbohydrates.

## 2022-05-14 NOTE — Progress Notes (Signed)
Name: Claire Irwin  Age/ Sex: 51 y.o., female   MRN/ DOB: [id number], [birth year]     PCP: Biagio Borg, MD   Reason for Endocrinology Evaluation: Type 2 Diabetes Mellitus  Initial Endocrine Consultative Visit: 07/29/2016    PATIENT IDENTIFIER: Claire Irwin is a 51 y.o. female with a past medical history of T2DM, asthma, dyslipidemia. The patient has followed with Endocrinology clinic since 07/29/2016 for consultative assistance with management of her diabetes.  DIABETIC HISTORY:  Claire Irwin was diagnosed with DM 2017, intolerant to metformin-abdominal pain, intolerant to Wilford Grist, tried Rybelsus just a couple of days only because she was afraid of hypoglycemia. Marland Kitchen Her hemoglobin A1c has ranged from 5.8% in 2019, peaking at 7.1% in 2023.  Patient followed up with Dr. Loanne Drilling from 2017 until April 2023 SUBJECTIVE:   During the last visit (12/04/2021):Dr. Loanne Drilling  Today (05/14/2022): Claire Irwin is here for follow-up on diabetes management.  She checks her blood sugars occasionally .   Has IBS-C on linzess which causes diarrhea   Denies nausea and vomiting   Dr. Loanne Drilling has attempted to prescribe Rybelsus but she was concerned about hypoglycemia so she only took 2 tablets of it She is a Radio producer, she eats 2 meals a day and if she skips a meal she will snack on starchy items    HOME DIABETES REGIMEN:  Jardiance 25 mg daily Januvia 100 mg daily   Statin: No ACE-I/ARB: No    METER DOWNLOAD SUMMARY: did not bring     DIABETIC COMPLICATIONS: Microvascular complications:   Denies: CKD, retinopathy , neuropathy Last Eye Exam: Completed 01/2022  Macrovascular complications:   Denies: CAD, CVA, PVD   HISTORY:  Past Medical History:  Past Medical History:  Diagnosis Date   Allergy    Asthma    Diabetes mellitus without complication ([place])    DJD (degenerative joint disease) of knee    LEFT   Endometriosis    Fibrocystic breast disease    GERD  (gastroesophageal reflux disease)    History of endometriosis    Hyperlipidemia 06/29/2011   Preeclampsia    Past Surgical History:  Past Surgical History:  Procedure Laterality Date   ABDOMINAL ADHESION SURGERY  07/2004   CESAREAN SECTION  02/2004   DILATION AND CURETTAGE, DIAGNOSTIC / THERAPEUTIC     PARTIAL HYSTERECTOMY     still have ovaries   Social History:  reports that she has never smoked. She has never used smokeless tobacco. She reports that she does not drink alcohol and does not use drugs. Family History:  Family History  Problem Relation Age of Onset   Diabetes Mother    Hypertension Mother    Asthma Mother    Allergies Mother    Breast cancer Mother    Diabetes Father    Heart disease Paternal Uncle    Asthma Maternal Grandmother    Allergies Maternal Grandmother    Breast cancer Maternal Grandmother    Cancer Paternal Grandmother        Brain and Lung   Breast cancer Maternal Aunt    Colon cancer Neg Hx    Esophageal cancer Neg Hx    Stomach cancer Neg Hx    Rectal cancer Neg Hx      HOME MEDICATIONS: Allergies as of 05/14/2022       Reactions   Hydrocodone    Pt reports it felt like she was spinning while taking.    Sulfonamide Derivatives  Medication List        Accurate as of May 14, 2022 12:16 PM. If you have any questions, ask your nurse or doctor.          Advair Diskus 250-50 MCG/ACT Aepb Generic drug: fluticasone-salmeterol Inhale 1 puff into the lungs daily.   albuterol 108 (90 Base) MCG/ACT inhaler Commonly known as: VENTOLIN HFA Inhale 2 puffs into the lungs every 4 (four) hours as needed for wheezing or shortness of breath.   ALLEGRA PO Take by mouth.   aspirin EC 81 MG tablet Take 1 tablet (81 mg total) by mouth daily.   DIGESTIVE ENZYMES PO Take 1 capsule by mouth daily.   empagliflozin 25 MG Tabs tablet Commonly known as: Jardiance Take 1 tablet (25 mg total) by mouth daily before breakfast.    Januvia 100 MG tablet Generic drug: sitaGLIPtin Take 100 mg by mouth daily.   linaclotide 290 MCG Caps capsule Commonly known as: Linzess Take 1 capsule (290 mcg total) by mouth daily before breakfast.   loratadine 10 MG tablet Commonly known as: CLARITIN Take 10 mg by mouth daily as needed for allergies.   MILK OF MAGNESIA PO Take 1 Dose by mouth as needed.   montelukast 10 MG tablet Commonly known as: Singulair Take 1 tablet (10 mg total) by mouth at bedtime. What changed:  when to take this reasons to take this   Motegrity 2 MG Tabs Generic drug: Prucalopride Succinate Take 1 tablet (2 mg total) by mouth daily.   NASACORT ALLERGY 24HR NA Place 1 spray into the nose daily.   norethindrone 0.35 MG tablet Commonly known as: MICRONOR Take 1 tablet by mouth daily.   Olopatadine HCl 0.2 % Soln Commonly known as: Pataday Place 1 drop into both eyes daily as needed.   OneTouch Ultra test strip Generic drug: glucose blood USE TO MONITOR GLUCOSE LEVELS ONCE PER DAY   pantoprazole 40 MG tablet Commonly known as: PROTONIX Take 1 tablet (40 mg total) by mouth daily.   PROBIOTIC DAILY PO Take 1 capsule by mouth daily.   Ryaltris 366-44 MCG/ACT Susp Generic drug: Olopatadine-Mometasone Place 2 sprays twice a day as needed         OBJECTIVE:   Vital Signs: BP 120/72 (BP Location: Left Arm, Patient Position: Sitting, Cuff Size: Large)   Pulse 80   Ht 5' 5.25" (1.657 m)   Wt 195 lb 12.8 oz (88.8 kg)   SpO2 98%   BMI 32.33 kg/m   Wt Readings from Last 3 Encounters:  05/14/22 195 lb 12.8 oz (88.8 kg)  04/23/22 195 lb 6 oz (88.6 kg)  03/18/22 195 lb (88.5 kg)     Exam: General: Pt appears well and is in NAD  Neck: General: Supple without adenopathy. Thyroid: Thyroid size normal.  No goiter or nodules appreciated.   Lungs: Clear with good BS bilat   Heart: RRR   Abdomen:  soft, nontender  Extremities: No pretibial edema.   Neuro: MS is good with  appropriate affect, pt is alert and Ox3    DM foot exam: 05/14/2022  The skin of the feet is intact without sores or ulcerations. The pedal pulses are 2+ on right and 2+ on left. The sensation is intact to a screening 5.07, 10 gram monofilament bilaterally     DATA REVIEWED:  Lab Results  Component Value Date   HGBA1C 7.3 (A) 05/14/2022   HGBA1C 7.1 (A) 12/04/2021   HGBA1C 6.8 (A) 09/05/2021   Lab  Results  Component Value Date   MICROALBUR <0.7 07/23/2021   LDLCALC 83 07/23/2021   CREATININE 1.16 07/23/2021   Lab Results  Component Value Date   MICRALBCREAT 0.9 07/23/2021     Lab Results  Component Value Date   CHOL 164 07/23/2021   HDL 62.80 07/23/2021   LDLCALC 83 07/23/2021   TRIG 95.0 07/23/2021   CHOLHDL 3 07/23/2021         ASSESSMENT / PLAN / RECOMMENDATIONS:   1) Type 2 Diabetes Mellitus, suboptimally controlled, With out complications - Most recent A1c of 7.3%. Goal A1c <7.0%.    -Her A1c has been gradually trending up from 6.8 to 7.3% today -We discussed in detail options of low-carb snacks, I have encouraged exercise with walking is much as possible -We also discussed switching Januvia to Rybelsus, we did discuss the GI side effects as well as the mechanism of action and that the risk of hypoglycemia is very low -Patient is in agreement of this -We will stop Januvia and start Rybelsus, she was given #30 of 3 mg tablets -We discussed the importance of glucose checks even if it a couple times a week  MEDICATIONS: Stop Januvia Start Rybelsus 3 mg daily, increase to 7 mg daily after a month Continue Jardiance 25 mg daily  EDUCATION / INSTRUCTIONS: BG monitoring instructions: Patient is instructed to check her blood sugars 12-3 times a week. Call [facility] if: BG persistently < 70  I reviewed the Rule of 15 for the treatment of hypoglycemia in detail with the patient. Literature supplied.    2) Diabetic complications:  Eye:  Does not have known diabetic retinopathy.  Neuro/ Feet: Does not have known diabetic peripheral neuropathy .  Renal: Patient does not  have known baseline CKD. She   is not on an ACEI/ARB at present.      F/U in 4 months   Signed electronically by: Mack Guise, MD  [facility]  [facility] [address] [place], [place] [address] Phone: [phone] FAX: [phone]   CC: Biagio Borg, [place] [address] Phone: [phone]  Fax: [phone]  Return to Endocrinology clinic as below: Future Appointments  Date Time Provider [place]  06/23/2022  3:40 PM Thornton Park, MD [facility]

## 2022-05-25 ENCOUNTER — Encounter: Payer: Self-pay | Admitting: Gastroenterology

## 2022-05-28 ENCOUNTER — Ambulatory Visit: Payer: BC Managed Care – PPO | Admitting: Internal Medicine

## 2022-05-28 ENCOUNTER — Encounter: Payer: Self-pay | Admitting: Internal Medicine

## 2022-05-28 VITALS — BP 118/78 | HR 95 | Temp 99.2°F | Wt 192.0 lb

## 2022-05-28 DIAGNOSIS — F411 Generalized anxiety disorder: Secondary | ICD-10-CM

## 2022-05-28 DIAGNOSIS — E1165 Type 2 diabetes mellitus with hyperglycemia: Secondary | ICD-10-CM | POA: Diagnosis not present

## 2022-05-28 DIAGNOSIS — L509 Urticaria, unspecified: Secondary | ICD-10-CM

## 2022-05-28 DIAGNOSIS — J4530 Mild persistent asthma, uncomplicated: Secondary | ICD-10-CM

## 2022-05-28 MED ORDER — METHYLPREDNISOLONE ACETATE 80 MG/ML IJ SUSP
80.0000 mg | Freq: Once | INTRAMUSCULAR | Status: AC
  Administered 2022-05-28: 80 mg via INTRAMUSCULAR

## 2022-05-28 MED ORDER — TRIAMCINOLONE ACETONIDE 0.1 % EX CREA: 1.0000 | TOPICAL_CREAM | Freq: Two times a day (BID) | CUTANEOUS | 0 refills | Status: AC

## 2022-05-28 MED ORDER — PREDNISONE 10 MG PO TABS: ORAL_TABLET | ORAL | 0 refills | Status: DC

## 2022-05-28 NOTE — Assessment & Plan Note (Signed)
Lab Results  Component Value Date   HGBA1C 7.3 (A) 05/14/2022   Stable, pt to continue current medical treatment jardiance 25 mg, rybelsus 7 mg

## 2022-05-28 NOTE — Assessment & Plan Note (Signed)
Mild situational, ok to follow, declines need for change in tx, reassured

## 2022-05-28 NOTE — Assessment & Plan Note (Signed)
Etiology unclear, for depomedrol I'm 80 mg, prednisone taper, triam cr prn itchy lesions,  to f/u any worsening symptoms or concerns

## 2022-05-28 NOTE — Progress Notes (Signed)
Patient ID: Claire Irwin, female   DOB: 1970/09/16, 51 y.o.   MRN: [id number]        Chief Complaint: follow up rash       HPI:  Claire Irwin is a 51 y.o. female here with c/o possible bed bugs, but appears to have diffuse wheal and flare lesions to face, torso, and extremities with marked itching, some better with allegra but still itches too much.  Pt denies chest pain, increased sob or doe, wheezing, orthopnea, PND, increased LE swelling, palpitations, dizziness or syncope.  Just back from beach hotel with finding bed bugs bites in others.   Pt denies polydipsia, polyuria, or new focal neuro s/s.    Pt denies fever, wt loss, night sweats, loss of appetite, or other constitutional symptoms          Wt Readings from Last 3 Encounters:  05/28/22 192 lb (87.1 kg)  05/14/22 195 lb 12.8 oz (88.8 kg)  04/23/22 195 lb 6 oz (88.6 kg)   BP Readings from Last 3 Encounters:  05/28/22 118/78  05/14/22 120/72  04/23/22 100/70         Past Medical History:  Diagnosis Date   Allergy    Asthma    Diabetes mellitus without complication (HCC)    DJD (degenerative joint disease) of knee    LEFT   Endometriosis    Fibrocystic breast disease    GERD (gastroesophageal reflux disease)    History of endometriosis    Hyperlipidemia 06/29/2011   Preeclampsia    Past Surgical History:  Procedure Laterality Date   ABDOMINAL ADHESION SURGERY  07/2004   CESAREAN SECTION  02/2004   DILATION AND CURETTAGE, DIAGNOSTIC / THERAPEUTIC     PARTIAL HYSTERECTOMY     still have ovaries    reports that she has never smoked. She has never used smokeless tobacco. She reports that she does not drink alcohol and does not use drugs. family history includes Allergies in her maternal grandmother and mother; Asthma in her maternal grandmother and mother; Breast cancer in her maternal aunt, maternal grandmother, and mother; Cancer in her paternal grandmother; Diabetes in her father and mother; Heart disease in her  paternal uncle; Hypertension in her mother. Allergies  Allergen Reactions   Hydrocodone     Pt reports it felt like she was spinning while taking.    Sulfonamide Derivatives    Current Outpatient Medications on File Prior to Visit  Medication Sig Dispense Refill   ADVAIR DISKUS 250-50 MCG/ACT AEPB Inhale 1 puff into the lungs daily.     albuterol (VENTOLIN HFA) 108 (90 Base) MCG/ACT inhaler Inhale 2 puffs into the lungs every 4 (four) hours as needed for wheezing or shortness of breath. 8.5 g 2   aspirin EC 81 MG tablet Take 1 tablet (81 mg total) by mouth daily. 90 tablet 11   DIGESTIVE ENZYMES PO Take 1 capsule by mouth daily. (Patient not taking: Reported on 05/14/2022)     empagliflozin (JARDIANCE) 25 MG TABS tablet Take 1 tablet (25 mg total) by mouth daily before breakfast. 90 tablet 3   Fexofenadine HCl (ALLEGRA PO) Take by mouth.     linaclotide (LINZESS) 290 MCG CAPS capsule Take 1 capsule (290 mcg total) by mouth daily before breakfast. (Patient not taking: Reported on 05/14/2022) 30 capsule 5   loratadine (CLARITIN) 10 MG tablet Take 10 mg by mouth daily as needed for allergies.     Magnesium Hydroxide (MILK OF MAGNESIA PO) Take 1  Dose by mouth as needed.     montelukast (SINGULAIR) 10 MG tablet Take 1 tablet (10 mg total) by mouth at bedtime. (Patient taking differently: Take 10 mg by mouth as needed.) 30 tablet 2   norethindrone (MICRONOR) 0.35 MG tablet Take 1 tablet by mouth daily.     Olopatadine HCl (PATADAY) 0.2 % SOLN Place 1 drop into both eyes daily as needed. (Patient not taking: Reported on 05/14/2022) 2.5 mL 5   Olopatadine-Mometasone (RYALTRIS) 665-25 MCG/ACT SUSP Place 2 sprays twice a day as needed (Patient not taking: Reported on 05/14/2022) 29 g 5   ONETOUCH ULTRA test strip USE TO MONITOR GLUCOSE LEVELS ONCE PER DAY 100 strip 3   pantoprazole (PROTONIX) 40 MG tablet Take 1 tablet (40 mg total) by mouth daily. 90 tablet 3   Probiotic Product (PROBIOTIC DAILY PO) Take  1 capsule by mouth daily. (Patient not taking: Reported on 05/14/2022)     Prucalopride Succinate (MOTEGRITY) 2 MG TABS Take 1 tablet (2 mg total) by mouth daily. 30 tablet 5   Semaglutide (RYBELSUS) 7 MG TABS Take 7 mg by mouth daily. 90 tablet 3   Triamcinolone Acetonide (NASACORT ALLERGY 24HR NA) Place 1 spray into the nose daily. (Patient not taking: Reported on 05/14/2022)     No current facility-administered medications on file prior to visit.        ROS:  All others reviewed and negative.  Objective        PE:  BP 118/78 (BP Location: Left Arm, Patient Position: Sitting, Cuff Size: Normal)   Pulse 95   Temp 99.2 F (37.3 C) (Oral)   Wt 192 lb (87.1 kg)   SpO2 97%   BMI 31.71 kg/m                 Constitutional: Pt appears in NAD               HENT: Head: NCAT.                Right Ear: External ear normal.                 Left Ear: External ear normal.                Eyes: . Pupils are equal, round, and reactive to light. Conjunctivae and EOM are normal               Nose: without d/c or deformity               Neck: Neck supple. Gross normal ROM               Cardiovascular: Normal rate and regular rhythm.                 Pulmonary/Chest: Effort normal and breath sounds without rales or wheezing.                Abd:  Soft, NT, ND, + BS, no organomegaly               Neurological: Pt is alert. At baseline orientation, motor grossly intact               Skin: LE edema - none, has diffuse marked hive lesions to face, torso, and extremities               Psychiatric: Pt behavior is normal without agitation , mild nervous  Micro: none  Cardiac tracings I have personally interpreted  today:  none  Pertinent Radiological findings (summarize): none   Lab Results  Component Value Date   WBC 5.9 07/23/2021   HGB 14.7 07/23/2021   HCT 44.3 07/23/2021   PLT 268.0 07/23/2021   GLUCOSE 142 (H) 07/23/2021   CHOL 164 07/23/2021   TRIG 95.0 07/23/2021   HDL 62.80 07/23/2021    LDLCALC 83 07/23/2021   ALT 18 07/23/2021   AST 17 07/23/2021   NA 141 07/23/2021   K 4.1 07/23/2021   CL 106 07/23/2021   CREATININE 1.16 07/23/2021   BUN 11 07/23/2021   CO2 27 07/23/2021   TSH 0.93 07/23/2021   HGBA1C 7.3 (A) 05/14/2022   MICROALBUR <0.7 07/23/2021   Assessment/Plan:  OPHIE BURROWES is a 51 y.o. Black or African American [2] female with  has a past medical history of Allergy, Asthma, Diabetes mellitus without complication ([place]), DJD (degenerative joint disease) of knee, Endometriosis, Fibrocystic breast disease, GERD (gastroesophageal reflux disease), History of endometriosis, Hyperlipidemia (06/29/2011), and Preeclampsia.  Hives Etiology unclear, for depomedrol I'm 80 mg, prednisone taper, triam cr prn itchy lesions,  to f/u any worsening symptoms or concerns  Mild persistent asthma O/w stable, cont inhaler prn  Diabetes (HCC) Lab Results  Component Value Date   HGBA1C 7.3 (A) 05/14/2022   Stable, pt to continue current medical treatment jardiance 25 mg, rybelsus 7 mg   Anxiety state Mild situational, ok to follow, declines need for change in tx, reassured  Followup: Return if symptoms worsen or fail to improve.  Cathlean Cower, MD 05/28/2022 2:52 PM [facility]

## 2022-05-28 NOTE — Patient Instructions (Signed)
Ok to take the allegra 180 mg per day  You had the steroid shot today  Please take all new medication as prescribed - the prednisone, steroid cream  Please continue all other medications as before, and refills have been done if requested.  Please have the pharmacy call with any other refills you may need.  Please keep your appointments with your specialists as you may have planned

## 2022-05-28 NOTE — Assessment & Plan Note (Signed)
O/w stable, cont inhaler prn 

## 2022-06-19 ENCOUNTER — Encounter: Payer: BC Managed Care – PPO | Admitting: Internal Medicine

## 2022-06-23 ENCOUNTER — Ambulatory Visit: Payer: BC Managed Care – PPO | Admitting: Gastroenterology

## 2022-07-13 ENCOUNTER — Other Ambulatory Visit: Payer: Self-pay

## 2022-07-13 MED ORDER — ONETOUCH DELICA LANCETS 33G MISC: 12 refills | Status: AC

## 2022-07-22 ENCOUNTER — Encounter: Payer: Self-pay | Admitting: Internal Medicine

## 2022-07-22 ENCOUNTER — Ambulatory Visit (INDEPENDENT_AMBULATORY_CARE_PROVIDER_SITE_OTHER): Payer: BC Managed Care – PPO | Admitting: Internal Medicine

## 2022-07-22 VITALS — BP 120/74 | HR 85 | Temp 98.9°F | Ht 65.25 in | Wt 189.0 lb

## 2022-07-22 DIAGNOSIS — J3089 Other allergic rhinitis: Secondary | ICD-10-CM

## 2022-07-22 DIAGNOSIS — E538 Deficiency of other specified B group vitamins: Secondary | ICD-10-CM | POA: Diagnosis not present

## 2022-07-22 DIAGNOSIS — Z23 Encounter for immunization: Secondary | ICD-10-CM

## 2022-07-22 DIAGNOSIS — Z0001 Encounter for general adult medical examination with abnormal findings: Secondary | ICD-10-CM | POA: Diagnosis not present

## 2022-07-22 DIAGNOSIS — E559 Vitamin D deficiency, unspecified: Secondary | ICD-10-CM | POA: Diagnosis not present

## 2022-07-22 DIAGNOSIS — E1165 Type 2 diabetes mellitus with hyperglycemia: Secondary | ICD-10-CM

## 2022-07-22 DIAGNOSIS — E785 Hyperlipidemia, unspecified: Secondary | ICD-10-CM

## 2022-07-22 DIAGNOSIS — J302 Other seasonal allergic rhinitis: Secondary | ICD-10-CM

## 2022-07-22 DIAGNOSIS — J4530 Mild persistent asthma, uncomplicated: Secondary | ICD-10-CM | POA: Diagnosis not present

## 2022-07-22 LAB — LIPID PANEL
Cholesterol: 168 mg/dL (ref 0–200)
HDL: 62.4 mg/dL (ref >39.00)
LDL Cholesterol: 93 mg/dL (ref 0–99)
NonHDL: 105.78
Total CHOL/HDL Ratio: 3
Triglycerides: 64 mg/dL (ref 0.0–149.0)
VLDL: 12.8 mg/dL (ref 0.0–40.0)

## 2022-07-22 LAB — CBC WITH DIFFERENTIAL/PLATELET
Basophils Absolute: 0 10*3/uL (ref 0.0–0.1)
Basophils Relative: 0.4 % (ref 0.0–3.0)
Eosinophils Absolute: 0.1 10*3/uL (ref 0.0–0.7)
Eosinophils Relative: 2.3 % (ref 0.0–5.0)
HCT: 47.1 % — ABNORMAL HIGH (ref 36.0–46.0)
Hemoglobin: 15.8 g/dL — ABNORMAL HIGH (ref 12.0–15.0)
Lymphocytes Relative: 39.3 % (ref 12.0–46.0)
Lymphs Abs: 2 10*3/uL (ref 0.7–4.0)
MCHC: 33.6 g/dL (ref 30.0–36.0)
MCV: 87.9 fl (ref 78.0–100.0)
Monocytes Absolute: 0.4 10*3/uL (ref 0.1–1.0)
Monocytes Relative: 7.3 % (ref 3.0–12.0)
Neutro Abs: 2.6 10*3/uL (ref 1.4–7.7)
Neutrophils Relative %: 50.7 % (ref 43.0–77.0)
Platelets: 275 10*3/uL (ref 150.0–400.0)
RBC: 5.36 Mil/uL — ABNORMAL HIGH (ref 3.87–5.11)
RDW: 14.8 % (ref 11.5–15.5)
WBC: 5.1 10*3/uL (ref 4.0–10.5)

## 2022-07-22 LAB — MICROALBUMIN / CREATININE URINE RATIO
Creatinine,U: 64 mg/dL
Microalb Creat Ratio: 1.1 mg/g (ref 0.0–30.0)
Microalb, Ur: <0.7 mg/dL (ref 0.0–1.9)

## 2022-07-22 LAB — URINALYSIS, ROUTINE W REFLEX MICROSCOPIC
Bilirubin Urine: NEGATIVE
Hgb urine dipstick: NEGATIVE
Ketones, ur: NEGATIVE
Leukocytes,Ua: NEGATIVE
Nitrite: NEGATIVE
RBC / HPF: NONE SEEN (ref 0–?)
Specific Gravity, Urine: 1.01 (ref 1.000–1.030)
Total Protein, Urine: NEGATIVE
Urine Glucose: >=1000 — AB
Urobilinogen, UA: 0.2 (ref 0.0–1.0)
WBC, UA: NONE SEEN (ref 0–?)
pH: 7.5 (ref 5.0–8.0)

## 2022-07-22 LAB — HEPATIC FUNCTION PANEL
ALT: 19 U/L (ref 0–35)
AST: 21 U/L (ref 0–37)
Albumin: 4.5 g/dL (ref 3.5–5.2)
Alkaline Phosphatase: 89 U/L (ref 39–117)
Bilirubin, Direct: 0.1 mg/dL (ref 0.0–0.3)
Total Bilirubin: 0.7 mg/dL (ref 0.2–1.2)
Total Protein: 7.8 g/dL (ref 6.0–8.3)

## 2022-07-22 LAB — BASIC METABOLIC PANEL
BUN: 9 mg/dL (ref 6–23)
CO2: 30 mEq/L (ref 19–32)
Calcium: 9.6 mg/dL (ref 8.4–10.5)
Chloride: 107 mEq/L (ref 96–112)
Creatinine, Ser: 1.05 mg/dL (ref 0.40–1.20)
GFR: 61.78 mL/min (ref >60.00)
Glucose, Bld: 91 mg/dL (ref 70–99)
Potassium: 4 mEq/L (ref 3.5–5.1)
Sodium: 143 mEq/L (ref 135–145)

## 2022-07-22 LAB — TSH: TSH: 0.78 u[IU]/mL (ref 0.35–5.50)

## 2022-07-22 LAB — VITAMIN D 25 HYDROXY (VIT D DEFICIENCY, FRACTURES): VITD: 53.42 ng/mL (ref 30.00–100.00)

## 2022-07-22 LAB — VITAMIN B12: Vitamin B-12: 924 pg/mL — ABNORMAL HIGH (ref 211–911)

## 2022-07-22 LAB — HEMOGLOBIN A1C: Hgb A1c MFr Bld: 7.1 % — ABNORMAL HIGH (ref 4.6–6.5)

## 2022-07-22 NOTE — Assessment & Plan Note (Signed)
Lab Results  Component Value Date   LDLCALC 83 07/23/2021   Uncontrolled, pt to continue low chol diet, declines statin due to GI effects possible, and f/u lipids

## 2022-07-22 NOTE — Progress Notes (Signed)
Patient ID: Claire Irwin, female   DOB: 11/25/1970, 51 y.o.   MRN: [id number]         Chief Complaint:: wellness exam and Physical (Gastro and endo specialists feel that diabetic meds could be causing constipation)  ,        HPI:  Claire Irwin is a 51 y.o. female here for wellness exam; plans to call [facility] for eye exam soon, has pap and mammogram done with DR Meising[facility] in early dec, due for shingrix at pharmacy, for Tdap today o/w up to date                        Also Has lost wt recently with better diet.  Pt denies chest pain, increased sob or doe, wheezing, orthopnea, PND, increased LE swelling, palpitations, dizziness or syncope.   Pt denies polydipsia, polyuria, or new focal neuro s/s.  Pt denies fever, wt loss, night sweats, loss of appetite, or other constitutional symptoms  Has ongoing constipation but wants to stay on rybelsus Wt Readings from Last 3 Encounters:  07/22/22 189 lb (85.7 kg)  05/28/22 192 lb (87.1 kg)  05/14/22 195 lb 12.8 oz (88.8 kg)   BP Readings from Last 3 Encounters:  07/22/22 120/74  05/28/22 118/78  05/14/22 120/72   Immunization History  Administered Date(s) Administered   Influenza Whole 07/12/2007   Influenza,inj,Quad PF,6+ Mos 07/01/2016, 08/04/2018   MMR 01/12/2000   OPV 11/03/1971, 12/11/1971, 01/12/1972, 02/22/1973, 03/18/1977   PFIZER Comirnaty(Gray Top)Covid-19 Tri-Sucrose Vaccine 03/10/2021   PFIZER(Purple Top)SARS-COV-2 Vaccination 11/02/2019, 11/24/2019, 07/20/2020   Pfizer Covid-19 Vaccine Bivalent Booster 31yr & up 08/26/2021   Td 01/05/2000   Tdap 11/22/2012, 07/22/2022   Health Maintenance Due  Topic Date Due   OPHTHALMOLOGY EXAM  02/25/2022   Diabetic kidney evaluation - GFR measurement  07/23/2022   Diabetic kidney evaluation - Urine ACR  07/23/2022      Past Medical History:  Diagnosis Date   Allergy    Asthma    Diabetes mellitus without complication (HCC)    DJD (degenerative joint disease) of knee     LEFT   Endometriosis    Fibrocystic breast disease    GERD (gastroesophageal reflux disease)    History of endometriosis    Hyperlipidemia 06/29/2011   Preeclampsia    Past Surgical History:  Procedure Laterality Date   ABDOMINAL ADHESION SURGERY  07/2004   CESAREAN SECTION  02/2004   DILATION AND CURETTAGE, DIAGNOSTIC / THERAPEUTIC     PARTIAL HYSTERECTOMY     still have ovaries    reports that she has never smoked. She has never used smokeless tobacco. She reports that she does not drink alcohol and does not use drugs. family history includes Allergies in her maternal grandmother and mother; Asthma in her maternal grandmother and mother; Breast cancer in her maternal aunt, maternal grandmother, and mother; Cancer in her paternal grandmother; Diabetes in her father and mother; Heart disease in her paternal uncle; Hypertension in her mother. Allergies  Allergen Reactions   Hydrocodone     Pt reports it felt like she was spinning while taking.    Sulfonamide Derivatives    Current Outpatient Medications on File Prior to Visit  Medication Sig Dispense Refill   ADVAIR DISKUS 250-50 MCG/ACT AEPB Inhale 1 puff into the lungs daily.     albuterol (VENTOLIN HFA) 108 (90 Base) MCG/ACT inhaler Inhale 2 puffs into the lungs every 4 (four) hours as needed  for wheezing or shortness of breath. 8.5 g 2   aspirin EC 81 MG tablet Take 1 tablet (81 mg total) by mouth daily. 90 tablet 11   empagliflozin (JARDIANCE) 25 MG TABS tablet Take 1 tablet (25 mg total) by mouth daily before breakfast. 90 tablet 3   Fexofenadine HCl (ALLEGRA PO) Take by mouth.     loratadine (CLARITIN) 10 MG tablet Take 10 mg by mouth daily as needed for allergies.     Magnesium Hydroxide (MILK OF MAGNESIA PO) Take 1 Dose by mouth as needed.     norethindrone (MICRONOR) 0.35 MG tablet Take 1 tablet by mouth daily.     Olopatadine HCl (PATADAY) 0.2 % SOLN Place 1 drop into both eyes daily as needed. 2.5 mL 5    Olopatadine-Mometasone (RYALTRIS) 665-25 MCG/ACT SUSP Place 2 sprays twice a day as needed 29 g 5   OneTouch Delica Lancets 83T MISC Check blood sugar 3 times  a week 100 each 12   ONETOUCH ULTRA test strip USE TO MONITOR GLUCOSE LEVELS ONCE PER DAY 100 strip 3   pantoprazole (PROTONIX) 40 MG tablet Take 1 tablet (40 mg total) by mouth daily. 90 tablet 3   predniSONE (DELTASONE) 10 MG tablet 3 tabs by mouth per day for 3 days,2tabs per day for 3 days,1tab per day for 3 days 18 tablet 0   Semaglutide (RYBELSUS) 7 MG TABS Take 7 mg by mouth daily. 90 tablet 3   triamcinolone cream (KENALOG) 0.1 % Apply 1 Application topically 2 (two) times daily. 30 g 0   linaclotide (LINZESS) 290 MCG CAPS capsule Take 1 capsule (290 mcg total) by mouth daily before breakfast. (Patient not taking: Reported on 05/14/2022) 30 capsule 5   montelukast (SINGULAIR) 10 MG tablet Take 1 tablet (10 mg total) by mouth at bedtime. (Patient taking differently: Take 10 mg by mouth as needed.) 30 tablet 2   Probiotic Product (PROBIOTIC DAILY PO) Take 1 capsule by mouth daily. (Patient not taking: Reported on 05/14/2022)     Prucalopride Succinate (MOTEGRITY) 2 MG TABS Take 1 tablet (2 mg total) by mouth daily. (Patient not taking: Reported on 07/22/2022) 30 tablet 5   Triamcinolone Acetonide (NASACORT ALLERGY 24HR NA) Place 1 spray into the nose daily. (Patient not taking: Reported on 05/14/2022)     No current facility-administered medications on file prior to visit.        ROS:  All others reviewed and negative.  Objective        PE:  BP 120/74 (BP Location: Left Arm, Patient Position: Sitting, Cuff Size: Large)   Pulse 85   Temp 98.9 F (37.2 C) (Oral)   Ht 5' 5.25" (1.657 m)   Wt 189 lb (85.7 kg)   SpO2 97%   BMI 31.21 kg/m                 Constitutional: Pt appears in NAD               HENT: Head: NCAT.                Right Ear: External ear normal.                 Left Ear: External ear normal.                 Eyes: . Pupils are equal, round, and reactive to light. Conjunctivae and EOM are normal  Nose: without d/c or deformity               Neck: Neck supple. Gross normal ROM               Cardiovascular: Normal rate and regular rhythm.                 Pulmonary/Chest: Effort normal and breath sounds without rales or wheezing.                Abd:  Soft, NT, ND, + BS, no organomegaly               Neurological: Pt is alert. At baseline orientation, motor grossly intact               Skin: Skin is warm. No rashes, no other new lesions, LE edema - none               Psychiatric: Pt behavior is normal without agitation   Micro: none  Cardiac tracings I have personally interpreted today:  none  Pertinent Radiological findings (summarize): none   Lab Results  Component Value Date   WBC 5.9 07/23/2021   HGB 14.7 07/23/2021   HCT 44.3 07/23/2021   PLT 268.0 07/23/2021   GLUCOSE 142 (H) 07/23/2021   CHOL 164 07/23/2021   TRIG 95.0 07/23/2021   HDL 62.80 07/23/2021   LDLCALC 83 07/23/2021   ALT 18 07/23/2021   AST 17 07/23/2021   NA 141 07/23/2021   K 4.1 07/23/2021   CL 106 07/23/2021   CREATININE 1.16 07/23/2021   BUN 11 07/23/2021   CO2 27 07/23/2021   TSH 0.93 07/23/2021   HGBA1C 7.3 (A) 05/14/2022   MICROALBUR <0.7 07/23/2021   Assessment/Plan:  Claire Irwin is a 51 y.o. Black or African American [2] female with  has a past medical history of Allergy, Asthma, Diabetes mellitus without complication ([place]), DJD (degenerative joint disease) of knee, Endometriosis, Fibrocystic breast disease, GERD (gastroesophageal reflux disease), History of endometriosis, Hyperlipidemia (06/29/2011), and Preeclampsia.  Encounter for well adult exam with abnormal findings Age and sex appropriate education and counseling updated with regular exercise and diet Referrals for preventative services - none needed Immunizations addressed - for Tdap today, then shingrix at pharmacy Smoking  counseling  - none needed Evidence for depression or other mood disorder - none significant Most recent labs reviewed. I have personally reviewed and have noted: 1) the patient's medical and social history 2) The patient's current medications and supplements 3) The patient's height, weight, and BMI have been recorded in the chart   Diabetes [facility]) Lab Results  Component Value Date   HGBA1C 7.3 (A) 05/14/2022   Stable, pt to continue current medical treatment ., jardiacne 25 mg qd, semiglutide 7 mg for now, f/u endo   Hyperlipidemia Lab Results  Component Value Date   LDLCALC 83 07/23/2021   Uncontrolled, pt to continue low chol diet, declines statin due to GI effects possible, and f/u lipids   Mild persistent asthma Stable, to continue inhaler prn and adviar 250-/50 qd  Seasonal and perennial allergic rhinitis Stable, cont claritin 10 qd   Followup: Return in about 1 year (around 07/23/2023).  Cathlean Cower, MD 07/22/2022 1:54 PM [facility]

## 2022-07-22 NOTE — Assessment & Plan Note (Signed)
Lab Results  Component Value Date   HGBA1C 7.3 (A) 05/14/2022   Stable, pt to continue current medical treatment ., jardiacne 25 mg qd, semiglutide 7 mg for now, f/u endo

## 2022-07-22 NOTE — Patient Instructions (Addendum)
Please have your Shingrix (shingles) shots done at your local pharmacy.  You had the Tdap tetanus shot today  Please continue all other medications as before, and refills have been done if requested.  Please have the pharmacy call with any other refills you may need.  Please continue your efforts at being more active, low cholesterol diet, and weight control.  You are otherwise up to date with prevention measures today.  Please keep your appointments with your specialists as you may have planned - endocrinology  Please go to the LAB at the blood drawing area for the tests to be done  You will be contacted by phone if any changes need to be made immediately.  Otherwise, you will receive a letter about your results with an explanation, but please check with MyChart first.  Please remember to sign up for MyChart if you have not done so, as this will be important to you in the future with finding out test results, communicating by private email, and scheduling acute appointments online when needed.  Please make an Appointment to return for your 1 year visit, or sooner if needed, with Lab testing by Appointment as well, to be done about 3-5 days before at the [facility] (so this is for TWO appointments - please see the scheduling desk as you leave)

## 2022-07-22 NOTE — Assessment & Plan Note (Signed)
Age and sex appropriate education and counseling updated with regular exercise and diet Referrals for preventative services - none needed Immunizations addressed - for Tdap today, then shingrix at pharmacy Smoking counseling  - none needed Evidence for depression or other mood disorder - none significant Most recent labs reviewed. I have personally reviewed and have noted: 1) the patient's medical and social history 2) The patient's current medications and supplements 3) The patient's height, weight, and BMI have been recorded in the chart

## 2022-07-22 NOTE — Assessment & Plan Note (Signed)
Stable, cont claritin 10 qd

## 2022-07-22 NOTE — Assessment & Plan Note (Addendum)
Stable, to continue inhaler prn and adviar 250-/50 qd

## 2022-08-13 ENCOUNTER — Other Ambulatory Visit: Payer: Self-pay | Admitting: Internal Medicine

## 2022-09-09 ENCOUNTER — Ambulatory Visit: Payer: BC Managed Care – PPO | Admitting: Gastroenterology

## 2022-09-09 ENCOUNTER — Encounter: Payer: Self-pay | Admitting: Gastroenterology

## 2022-09-09 VITALS — BP 102/60 | HR 95 | Ht 65.0 in | Wt 189.2 lb

## 2022-09-09 DIAGNOSIS — R1013 Epigastric pain: Secondary | ICD-10-CM | POA: Diagnosis not present

## 2022-09-09 DIAGNOSIS — K5900 Constipation, unspecified: Secondary | ICD-10-CM

## 2022-09-09 MED ORDER — RIFAXIMIN 550 MG PO TABS: 550.0000 mg | ORAL_TABLET | Freq: Three times a day (TID) | ORAL | 0 refills | Status: AC

## 2022-09-09 MED ORDER — MOTEGRITY 2 MG PO TABS: 2.0000 mg | ORAL_TABLET | Freq: Every day | ORAL | 5 refills | Status: AC

## 2022-09-09 NOTE — Progress Notes (Signed)
Referring Provider: Biagio Borg, MD Primary Care Physician:  Biagio Borg, MD  Chief complaint:  Constipation, abdominal pain   IMPRESSION:  Upper abdominal pain with associated malodorous eructation Chronic constipation without alarm features    - normal TSH and calcium    - incompletely relieved Linzess, Miralax, stool softeners, milk of mangesium    - using magnesium citrate for rescue therapy    - SitzMark study showed no retained rings Iron deficiency with anemia noted on 12/21 Reflux and H pylori negative gastritis History of colon polyps    - 2 tubular adenomas on colonoscopy 10/11/19    - surveillance colonoscopy recommended 2028 No known family history of colon cancer or polyps  Chronic constipation not responding to laxative therapy without alarm features: Trial of Amitiza instead of Linzess 290 mcg daily was unsuccessful, but, using the two together provides some relief. Given her concurrent upper GI symptoms, I still think she would be a good candidate for Motegrity. Consider referral for pelvic floor PT, although SitzMark study showed no retained stools. Semaglutide may also be contributing.  Upper abdominal pain: May be related to constipation, although she is unable to correlated the two. Sitz Mark showed significant stool in the right colon, although no retained rings. Must also consider functional dyspepsia. If not improving with improved constipation, will consider trial of TCA or even discontinuation of semaglutide.   Reflux: Reviewed lifestyle modifications. Pantoprazole as needed.  History of colon polyps: Reviewed pathology results. Surveillance colonoscopy due 2028.    PLAN: - Continue Pantoprazole 40 mg QAM - Continue Linzess 290 mcg daily and Amitiza 24 mcg BID - If insurance will cover Motegrity '2mg'$  daily will use this instead of Linzess and Amitizia - Xifaxan 550 mg BID x 14 days - Referral to pelvic floor PT - Discuss diabetes regimen with Dr.  Kelton Pillar, Rygelsus may be contributing - Office follow-up in 6-8 weeks, earlier if needed - Colonoscopy due 2028  Please see the "Patient Instructions" section for addition details about the plan.  HPI: Claire Irwin is a 52 y.o. female who returns in follow-up. Initially seen in consultation for constipation 08/24/19. Had a colonoscopy 10/11/19. Most recently seen 04/23/22 for upper abdominal pain and constipation. She returns today in scheduled follow-up. She has diabetes, asthma, endometriosis, and a history of a partial hysterectomy 10 years ago.  On previous visits she reported several years of altered bowel habits.  She noticed a change in bowel habits after she was diagnosed with diabetes several years ago. Bowel movement 3-4 times a week with varied consistency in the stool.  Associated bloating.  Some straining  and a sensation of incomplete evacuation. No use of digital maneuvers. No sensation of anorectal obstruction or blockage with 25 percent of bowel movements.  No blood or mucus.  No abdominal pain.   She reported ongoing constipation and a sense of incomplete evacuation despite Linzess, Amitiza 24 mc BID, Miralax, stool softeners, milk of mangesium, and magnesium citrate for rescue therapy.  Linzess increased to 290 daily improved the constipation but she did not like the severe diarrhea. Using the Linzess on weekends to minimize symptoms. Amitiza did not provide complete relief.   Also has a lifetime history of reflux. Taking omeprazole, Pepcid, and then protonix as she finds that she has to keep switching them to control her symptoms.  Takes medication most days but not consistently.  Trial of FDGard worsened her reflux.  Returns in follow-up today.  Continues to have  some upper abdominal pain but not as severe as before. She stopped taking all reflux medications.  Having a bowel movement 2-3 times a week.   Feels abdominal pressure in her epigastrium. Improves with defecation.    She has stopped using supplements without any significant change.  Started probiotic pearls recently when symptoms worsened. No longer constipated. Has had 4-5 loose BM every day for the last couple of weeks with associated increased gas and malodorous gas.   She has had some nausea and vomiting with Rybelsus.    Evaluation as included:  - Abdominal ultrasound 2/20202: normal - Sitz mark study 08/29/19: No retained marker, stool mainly in the ascending colon - Colonoscopy 10/11/19: 2 tubular adenomas, internal hmoerrhoids, left-sided diverticulosis - Abdominal ultrasound 10/22/20: normal - EGD 12/18/20: mild reflux esophagitis and chronic gastritis without H pylori - normal TSH and calcium - Normal CBC, CMP, ESR   Past Medical History:  Diagnosis Date   Allergy    Asthma    Diabetes mellitus without complication (HCC)    DJD (degenerative joint disease) of knee    LEFT   Endometriosis    Fibrocystic breast disease    GERD (gastroesophageal reflux disease)    History of endometriosis    Hyperlipidemia 06/29/2011   Preeclampsia     Past Surgical History:  Procedure Laterality Date   ABDOMINAL ADHESION SURGERY  07/2004   CESAREAN SECTION  02/2004   DILATION AND CURETTAGE, DIAGNOSTIC / THERAPEUTIC     PARTIAL HYSTERECTOMY     still have ovaries    Current Outpatient Medications  Medication Sig Dispense Refill   ADVAIR DISKUS 250-50 MCG/ACT AEPB Inhale 1 puff into the lungs daily.     albuterol (VENTOLIN HFA) 108 (90 Base) MCG/ACT inhaler Inhale 2 puffs into the lungs every 4 (four) hours as needed for wheezing or shortness of breath. 8.5 g 2   aspirin EC 81 MG tablet Take 1 tablet (81 mg total) by mouth daily. 90 tablet 11   CVS OLOPATADINE HCL 0.2 % SOLN PLACE 1 DROP INTO BOTH EYES DAILY AS NEEDED. 7.5 mL 1   empagliflozin (JARDIANCE) 25 MG TABS tablet Take 1 tablet (25 mg total) by mouth daily before breakfast. 90 tablet 3   Fexofenadine HCl (ALLEGRA PO) Take by mouth.      Magnesium Hydroxide (MILK OF MAGNESIA PO) Take 1 Dose by mouth as needed.     norethindrone (MICRONOR) 0.35 MG tablet Take 1 tablet by mouth daily.     Olopatadine-Mometasone (RYALTRIS) [id number] MCG/ACT SUSP Place 2 sprays twice a day as needed 29 g 5   OneTouch Delica Lancets 32I MISC Check blood sugar 3 times  a week 100 each 12   ONETOUCH ULTRA test strip USE TO MONITOR GLUCOSE LEVELS ONCE PER DAY 100 strip 3   Probiotic Product (PROBIOTIC DAILY PO) Take 1 capsule by mouth daily.     rifaximin (XIFAXAN) 550 MG TABS tablet Take 1 tablet (550 mg total) by mouth 3 (three) times daily. 42 tablet 0   Semaglutide (RYBELSUS) 7 MG TABS Take 7 mg by mouth daily. 90 tablet 3   Triamcinolone Acetonide (NASACORT ALLERGY 24HR NA) Place 1 spray into the nose daily.     triamcinolone cream (KENALOG) 0.1 % Apply 1 Application topically 2 (two) times daily. 30 g 0   loratadine (CLARITIN) 10 MG tablet Take 10 mg by mouth daily as needed for allergies. (Patient not taking: Reported on 09/09/2022)     montelukast (SINGULAIR) 10 MG  tablet Take 1 tablet (10 mg total) by mouth at bedtime. (Patient taking differently: Take 10 mg by mouth as needed.) 30 tablet 2   pantoprazole (PROTONIX) 40 MG tablet Take 1 tablet (40 mg total) by mouth daily. (Patient not taking: Reported on 09/09/2022) 90 tablet 3   predniSONE (DELTASONE) 10 MG tablet 3 tabs by mouth per day for 3 days,2tabs per day for 3 days,1tab per day for 3 days (Patient not taking: Reported on 09/09/2022) 18 tablet 0   Prucalopride Succinate (MOTEGRITY) 2 MG TABS Take 1 tablet (2 mg total) by mouth daily. 30 tablet 5   No current facility-administered medications for this visit.    Allergies as of 09/09/2022 - Review Complete 09/09/2022  Allergen Reaction Noted   Hydrocodone  02/21/2010   Sulfonamide derivatives      Family History  Problem Relation Age of Onset   Diabetes Mother    Hypertension Mother    Asthma Mother    Allergies Mother    Breast  cancer Mother    Diabetes Father    Heart disease Paternal Uncle    Asthma Maternal Grandmother    Allergies Maternal Grandmother    Breast cancer Maternal Grandmother    Cancer Paternal Grandmother        Brain and Lung   Breast cancer Maternal Aunt    Colon cancer Neg Hx    Esophageal cancer Neg Hx    Stomach cancer Neg Hx    Rectal cancer Neg Hx      Physical Exam: General:   Alert,  well-nourished, pleasant and cooperative in NAD Head:  Normocephalic and atraumatic. Eyes:  Sclera clear, no icterus.   Conjunctiva pink. Abdomen:  Soft, nontender, nondistended, normal bowel sounds, no rebound or guarding. No hepatosplenomegaly.   Rectal:  Deferred  Msk:  Symmetrical. No boney deformities LAD: No inguinal or umbilical LAD Extremities:  No clubbing or edema. Neurologic:  Alert and  oriented x4;  grossly nonfocal Skin:  Intact without significant lesions or rashes. Psych:  Alert and cooperative. Normal mood and affect.   Nakai Pollio L. Tarri Glenn, MD, MPH 09/09/2022, 5:10 PM

## 2022-09-09 NOTE — Patient Instructions (Signed)
We have sent the following medications to your pharmacy for you to pick up at your convenience: Xifaxan 550 mcg three times daily for 14 days.  Motegrity 2 mg daily.   _______________________________________________________  If you are age 52 or older, your body mass index should be between 23-30. Your Body mass index is 31.49 kg/m. If this is out of the aforementioned range listed, please consider follow up with your Primary Care Provider.  If you are age 1 or younger, your body mass index should be between 19-25. Your Body mass index is 31.49 kg/m. If this is out of the aformentioned range listed, please consider follow up with your Primary Care Provider.   ________________________________________________________  The  GI providers would like to encourage you to use [facility] to communicate with providers for non-urgent requests or questions.  Due to long hold times on the telephone, sending your provider a message by [facility] may be a faster and more efficient way to get a response.  Please allow 48 business hours for a response.  Please remember that this is for non-urgent requests.  _______________________________________________________

## 2022-09-11 ENCOUNTER — Telehealth: Payer: Self-pay | Admitting: Gastroenterology

## 2022-09-11 NOTE — Telephone Encounter (Signed)
Patient stated she went to go pick up her medication Motegrity and it would be over $300. Patient is requesting prior authorization or a more affordable option.  Please advise.

## 2022-09-16 ENCOUNTER — Other Ambulatory Visit (HOSPITAL_COMMUNITY): Payer: Self-pay

## 2022-09-16 NOTE — Telephone Encounter (Signed)
PA on file from 8.30.23, that is active until 8.29.24. Pt does have a high copay due to an unmet deductible.

## 2022-09-16 NOTE — Telephone Encounter (Signed)
Left message for patient to call back.

## 2022-09-17 NOTE — Telephone Encounter (Signed)
Patient is returning your call.  

## 2022-09-18 NOTE — Telephone Encounter (Signed)
Left message for patient to call back.

## 2022-09-21 NOTE — Telephone Encounter (Signed)
Left message for patient to call back.

## 2022-09-21 NOTE — Telephone Encounter (Signed)
Spoke with patient regarding Motegrity high copay. She states she is not having any constipation right now, she's been experiencing some diarrhea since starting Rygelsus. She has an appointment with her endocrinologist next week & advised she discuss further then to see if her symptoms are related to starting the new medication and then can follow up with [place] if she has any questions. Pt verbalized all understanding.

## 2022-09-27 ENCOUNTER — Encounter: Payer: Self-pay | Admitting: Internal Medicine

## 2022-09-29 ENCOUNTER — Ambulatory Visit: Payer: BC Managed Care – PPO | Admitting: Internal Medicine

## 2022-09-30 ENCOUNTER — Ambulatory Visit: Payer: BC Managed Care – PPO | Admitting: Internal Medicine

## 2022-10-05 ENCOUNTER — Encounter: Payer: Self-pay | Admitting: Internal Medicine

## 2022-10-05 ENCOUNTER — Ambulatory Visit: Payer: BC Managed Care – PPO | Admitting: Internal Medicine

## 2022-10-05 VITALS — BP 104/70 | HR 100 | Ht 65.0 in | Wt 189.0 lb

## 2022-10-05 DIAGNOSIS — E119 Type 2 diabetes mellitus without complications: Secondary | ICD-10-CM

## 2022-10-05 LAB — POCT GLYCOSYLATED HEMOGLOBIN (HGB A1C): Hemoglobin A1C: 6.3 % — AB (ref 4.0–5.6)

## 2022-10-05 LAB — POCT GLUCOSE (DEVICE FOR HOME USE): POC Glucose: 99 mg/dl (ref 70–99)

## 2022-10-05 MED ORDER — EMPAGLIFLOZIN 25 MG PO TABS: 25.0000 mg | ORAL_TABLET | Freq: Every day | ORAL | 3 refills | Status: DC

## 2022-10-05 MED ORDER — ONETOUCH ULTRA VI STRP: 1.0000 | ORAL_STRIP | Freq: Every day | 3 refills | Status: AC

## 2022-10-05 MED ORDER — RYBELSUS 7 MG PO TABS: 7.0000 mg | ORAL_TABLET | Freq: Every day | ORAL | 3 refills | Status: DC

## 2022-10-05 NOTE — Patient Instructions (Addendum)
  Continue Rybelsus 7 mg tablets daily  Continue Jardiance 25 mg daily    HOW TO TREAT LOW BLOOD SUGARS (Blood sugar LESS THAN 70 MG/DL) Please follow the RULE OF 15 for the treatment of hypoglycemia treatment (when your (blood sugars are less than 70 mg/dL)   STEP 1: Take 15 grams of carbohydrates when your blood sugar is low, which includes:  3-4 GLUCOSE TABS  OR 3-4 OZ OF JUICE OR REGULAR SODA OR ONE TUBE OF GLUCOSE GEL    STEP 2: RECHECK blood sugar in 15 MINUTES STEP 3: If your blood sugar is still low at the 15 minute recheck --> then, go back to STEP 1 and treat AGAIN with another 15 grams of carbohydrates.

## 2022-10-05 NOTE — Progress Notes (Signed)
Name: Claire Irwin  Age/ Sex: 52 y.o., female   MRN/ DOB: [id number], 03/27/1971     PCP: Biagio Borg, MD   Reason for Endocrinology Evaluation: Type 2 Diabetes Mellitus  Initial Endocrine Consultative Visit: 07/29/2016    PATIENT IDENTIFIER: Claire Irwin is a 52 y.o. female with a past medical history of T2DM, asthma, dyslipidemia. The patient has followed with Endocrinology clinic since 07/29/2016 for consultative assistance with management of her diabetes.  DIABETIC HISTORY:  Claire Irwin was diagnosed with DM 2017, intolerant to metformin-abdominal pain, intolerant to Wilford Grist, tried Rybelsus just a couple of days only because she was afraid of hypoglycemia. Marland Kitchen Her hemoglobin A1c has ranged from 5.8% in 2019, peaking at 7.1% in 2023.  Patient followed up with Dr. Loanne Drilling from 2017 until April 2023 SUBJECTIVE:   During the last visit (05/14/2022): A1c 7.3%  Today (10/05/2022): Claire Irwin is here for follow-up on diabetes management.  She checks her blood sugars occasionally .   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 , recently has noted loose stools  Denies nausea and vomiting     HOME DIABETES REGIMEN:  Jardiance 25 mg daily Rybelsus 7 mg daily    Statin: No ACE-I/ARB: No    METER DOWNLOAD SUMMARY: did not bring     DIABETIC COMPLICATIONS: Microvascular complications:   Denies: CKD, retinopathy , neuropathy Last Eye Exam: Completed 01/2022  Macrovascular complications:   Denies: CAD, CVA, PVD   HISTORY:  Past Medical History:  Past Medical History:  Diagnosis Date   Allergy    Asthma    Diabetes mellitus without complication ([place])    DJD (degenerative joint disease) of knee    LEFT   Endometriosis    Fibrocystic breast disease    GERD (gastroesophageal reflux disease)    History of endometriosis    Hyperlipidemia 06/29/2011   Preeclampsia    Past Surgical History:  Past Surgical History:  Procedure Laterality Date   ABDOMINAL ADHESION  SURGERY  07/2004   CESAREAN SECTION  02/2004   DILATION AND CURETTAGE, DIAGNOSTIC / THERAPEUTIC     PARTIAL HYSTERECTOMY     still have ovaries   Social History:  reports that she has never smoked. She has never used smokeless tobacco. She reports that she does not drink alcohol and does not use drugs. Family History:  Family History  Problem Relation Age of Onset   Diabetes Mother    Hypertension Mother    Asthma Mother    Allergies Mother    Breast cancer Mother    Diabetes Father    Heart disease Paternal Uncle    Asthma Maternal Grandmother    Allergies Maternal Grandmother    Breast cancer Maternal Grandmother    Cancer Paternal Grandmother        Brain and Lung   Breast cancer Maternal Aunt    Colon cancer Neg Hx    Esophageal cancer Neg Hx    Stomach cancer Neg Hx    Rectal cancer Neg Hx      HOME MEDICATIONS: Allergies as of 10/05/2022       Reactions   Hydrocodone    Pt reports it felt like she was spinning while taking.    Sulfonamide Derivatives         Medication List        Accurate as of October 05, 2022  3:26 PM. If you have any questions, ask your nurse or doctor.  STOP taking these medications    pantoprazole 40 MG tablet Commonly known as: PROTONIX Stopped by: Dorita Sciara, MD   predniSONE 10 MG tablet Commonly known as: DELTASONE Stopped by: Dorita Sciara, MD       TAKE these medications    Advair Diskus 250-50 MCG/ACT Aepb Generic drug: fluticasone-salmeterol Inhale 1 puff into the lungs daily.   albuterol 108 (90 Base) MCG/ACT inhaler Commonly known as: VENTOLIN HFA Inhale 2 puffs into the lungs every 4 (four) hours as needed for wheezing or shortness of breath.   ALLEGRA PO Take by mouth.   aspirin EC 81 MG tablet Take 1 tablet (81 mg total) by mouth daily.   CVS Olopatadine HCl 0.2 % Soln Generic drug: Olopatadine HCl PLACE 1 DROP INTO BOTH EYES DAILY AS NEEDED.   empagliflozin 25 MG Tabs  tablet Commonly known as: Jardiance Take 1 tablet (25 mg total) by mouth daily before breakfast.   loratadine 10 MG tablet Commonly known as: CLARITIN Take 10 mg by mouth daily as needed for allergies.   MILK OF MAGNESIA PO Take 1 Dose by mouth as needed.   montelukast 10 MG tablet Commonly known as: Singulair Take 1 tablet (10 mg total) by mouth at bedtime. What changed:  when to take this reasons to take this   Motegrity 2 MG Tabs Generic drug: Prucalopride Succinate Take 1 tablet (2 mg total) by mouth daily.   NASACORT ALLERGY 24HR NA Place 1 spray into the nose daily.   norethindrone 0.35 MG tablet Commonly known as: MICRONOR Take 1 tablet by mouth daily.   OneTouch Delica Lancets 01U Misc Check blood sugar 3 times  a week   OneTouch Ultra test strip Generic drug: glucose blood USE TO MONITOR GLUCOSE LEVELS ONCE PER DAY   PROBIOTIC DAILY PO Take 1 capsule by mouth daily.   rifaximin 550 MG Tabs tablet Commonly known as: XIFAXAN Take 1 tablet (550 mg total) by mouth 3 (three) times daily.   Ryaltris 272-53 MCG/ACT Susp Generic drug: Olopatadine-Mometasone Place 2 sprays twice a day as needed   Rybelsus 7 MG Tabs Generic drug: Semaglutide Take 7 mg by mouth daily.   triamcinolone cream 0.1 % Commonly known as: KENALOG Apply 1 Application topically 2 (two) times daily.         OBJECTIVE:   Vital Signs: BP 104/70 (BP Location: Left Arm, Patient Position: Sitting, Cuff Size: Large)   Pulse 100   Ht '5\' 5"'$  (1.651 m)   Wt 189 lb (85.7 kg)   SpO2 97%   BMI 31.45 kg/m   Wt Readings from Last 3 Encounters:  10/05/22 189 lb (85.7 kg)  09/09/22 189 lb 4 oz (85.8 kg)  07/22/22 189 lb (85.7 kg)     Exam: General: Pt appears well and is in NAD  Neck: General: Supple without adenopathy. Thyroid: Thyroid size normal.  No goiter or nodules appreciated.   Lungs: Clear with good BS bilat   Heart: RRR   Abdomen:  soft, nontender  Extremities: No  pretibial edema.   Neuro: MS is good with appropriate affect, pt is alert and Ox3    DM foot exam: 05/14/2022  The skin of the feet is intact without sores or ulcerations. The pedal pulses are 2+ on right and 2+ on left. The sensation is intact to a screening 5.07, 10 gram monofilament bilaterally     DATA REVIEWED:  Lab Results  Component Value Date   HGBA1C 6.3 (A) 10/05/2022  HGBA1C 7.1 (H) 07/22/2022   HGBA1C 7.3 (A) 05/14/2022   Lab Results  Component Value Date   MICROALBUR <0.7 07/22/2022   LDLCALC 93 07/22/2022   CREATININE 1.05 07/22/2022   Lab Results  Component Value Date   MICRALBCREAT 1.1 07/22/2022     Lab Results  Component Value Date   CHOL 168 07/22/2022   HDL 62.40 07/22/2022   LDLCALC 93 07/22/2022   TRIG 64.0 07/22/2022   CHOLHDL 3 07/22/2022         ASSESSMENT / PLAN / RECOMMENDATIONS:   1) Type 2 Diabetes Mellitus, Optimally controlled, Without complications - Most recent A1c of 6.3%. Goal A1c <7.0%.    -Praised the pt on improved glucose control  - She does have occasional difficulty pricking her finger, she was advised to change lancing divide from 3 to 4 but this is painful  - Encouraged hydration   MEDICATIONS: - Continue  Rybelsus 7 mg daily  - Continue Jardiance 25 mg daily  EDUCATION / INSTRUCTIONS: BG monitoring instructions: Patient is instructed to check her blood sugars 2-3 times a week. Call [place] [facility] if: BG persistently < 70  I reviewed the Rule of 15 for the treatment of hypoglycemia in detail with the patient. Literature supplied.    2) Diabetic complications:  Eye: Does not have known diabetic retinopathy.  Neuro/ Feet: Does not have known diabetic peripheral neuropathy .  Renal: Patient does not  have known baseline CKD. She   is not on an ACEI/ARB at present.      F/U in 6 months   Signed electronically by: Mack Guise, MD  [facility]  [place]  [address] [place], [place] [address] Phone: [phone] FAX: [phone]   CC: Biagio Borg, [place] [address] Phone: [phone]  Fax: [phone]  Return to Endocrinology clinic as below: Future Appointments  Date Time Provider [place]  07/26/2023  9:00 AM Biagio Borg, MD [id number] None

## 2022-10-11 ENCOUNTER — Ambulatory Visit (HOSPITAL_COMMUNITY): Payer: Self-pay

## 2022-10-13 ENCOUNTER — Ambulatory Visit: Payer: BC Managed Care – PPO | Admitting: Emergency Medicine

## 2022-10-13 ENCOUNTER — Encounter: Payer: Self-pay | Admitting: Emergency Medicine

## 2022-10-13 VITALS — BP 112/72 | HR 101 | Temp 98.6°F | Ht 65.0 in | Wt 185.5 lb

## 2022-10-13 DIAGNOSIS — R0981 Nasal congestion: Secondary | ICD-10-CM

## 2022-10-13 DIAGNOSIS — J0100 Acute maxillary sinusitis, unspecified: Secondary | ICD-10-CM

## 2022-10-13 MED ORDER — AMOXICILLIN-POT CLAVULANATE 875-125 MG PO TABS: 1.0000 | ORAL_TABLET | Freq: Two times a day (BID) | ORAL | 0 refills | Status: AC

## 2022-10-13 NOTE — Patient Instructions (Signed)

## 2022-10-13 NOTE — Assessment & Plan Note (Signed)
Viral respiratory infection now with secondary bacterial sinus infection.  Will benefit from Augmentin twice a day for 7 days.

## 2022-10-13 NOTE — Progress Notes (Signed)
Claire Irwin 52 y.o.   Chief Complaint  Patient presents with   Acute Visit    Sinus issues and congestion, patient states she fell and hit her head a couple weeks ago, patient is unsure what caused it     HISTORY OF PRESENT ILLNESS: Acute problem visit today. This is a 52 y.o. female complaining of flulike symptoms that started 5 days ago.  Mostly sinus congestion and occasional drainage. Diabetic patient.  About 3 weeks ago got up from bed too fast, it was dark, lost balance and struck a Ecologist.  Lost balance and fell down.  Hit back of the head on the wall.  No loss of consciousness.  No persistent symptoms since.  Feeling better. Kindergarten Pharmacist, hospital.  HPI   Prior to Admission medications   Medication Sig Start Date End Date Taking? Authorizing Provider  aspirin EC 81 MG tablet Take 1 tablet (81 mg total) by mouth daily. 07/01/16  Yes Biagio Borg, MD  [facility] OLOPATADINE HCL 0.2 % SOLN PLACE 1 DROP INTO BOTH EYES DAILY AS NEEDED. 08/13/22  Yes Clemon Chambers, MD  empagliflozin (JARDIANCE) 25 MG TABS tablet Take 1 tablet (25 mg total) by mouth daily before breakfast. 10/05/22  Yes Shamleffer, Melanie Crazier, MD  glucose blood (ONETOUCH ULTRA) test strip 1 each by Other route daily in the afternoon. Use as instructed 10/05/22  Yes Shamleffer, Melanie Crazier, MD  Magnesium Hydroxide (MILK OF MAGNESIA PO) Take 1 Dose by mouth as needed.   Yes [provider]  norethindrone (MICRONOR) 0.35 MG tablet Take 1 tablet by mouth daily. 07/30/20  Yes [provider]  OneTouch Delica Lancets 99991111 MISC Check blood sugar 3 times  a week 07/13/22  Yes Shamleffer, Melanie Crazier, MD  Probiotic Product (PROBIOTIC DAILY PO) Take 1 capsule by mouth daily.   Yes [provider]  Semaglutide (RYBELSUS) 7 MG TABS Take 1 tablet (7 mg total) by mouth daily. 10/05/22  Yes Shamleffer, Melanie Crazier, MD  Triamcinolone Acetonide (NASACORT ALLERGY 24HR NA) Place 1 spray into the nose  daily.   Yes [provider]  triamcinolone cream (KENALOG) 0.1 % Apply 1 Application topically 2 (two) times daily. 05/28/22 05/28/23 Yes Biagio Borg, MD  ADVAIR DISKUS 250-50 MCG/ACT AEPB Inhale 1 puff into the lungs daily. Patient not taking: Reported on 10/05/2022 12/22/21   [provider]  albuterol (VENTOLIN HFA) 108 (90 Base) MCG/ACT inhaler Inhale 2 puffs into the lungs every 4 (four) hours as needed for wheezing or shortness of breath. Patient not taking: Reported on 10/05/2022 09/24/21   Clemon Chambers, MD  Fexofenadine HCl (ALLEGRA PO) Take by mouth. Patient not taking: Reported on 10/05/2022    [provider]  loratadine (CLARITIN) 10 MG tablet Take 10 mg by mouth daily as needed for allergies. Patient not taking: Reported on 09/09/2022    [provider]  montelukast (SINGULAIR) 10 MG tablet Take 1 tablet (10 mg total) by mouth at bedtime. Patient taking differently: Take 10 mg by mouth as needed. 10/06/21 04/23/22  Lynden Oxford Scales, PA-C  Olopatadine-Mometasone [place]) 665-25 MCG/ACT SUSP Place 2 sprays twice a day as needed Patient not taking: Reported on 10/05/2022 12/24/21   Clemon Chambers, MD  Prucalopride Succinate (MOTEGRITY) 2 MG TABS Take 1 tablet (2 mg total) by mouth daily. Patient not taking: Reported on 10/05/2022 09/09/22   Thornton Park, MD  rifaximin (XIFAXAN) 550 MG TABS tablet Take 1 tablet (550 mg total) by mouth  3 (three) times daily. Patient not taking: Reported on 10/05/2022 09/09/22   Thornton Park, MD    Allergies  Allergen Reactions   Hydrocodone     Pt reports it felt like she was spinning while taking.    Sulfonamide Derivatives     Patient Active Problem List   Diagnosis Date Noted   Hives 05/28/2022   Mixed rhinitis 12/24/2021   Mild persistent asthma 12/24/2021   Seasonal and perennial allergic rhinitis 09/24/2021   Allergic conjunctivitis of both eyes 09/24/2021   Left carpal tunnel syndrome 07/19/2021    Bilateral knee pain 08/27/2020   Chalazion of right upper eyelid 08/08/2019   Neck pain 05/03/2019   Nonallopathic lesion of sacral region 04/05/2019   Nonallopathic lesion of lumbosacral region 04/05/2019   Wart of face 04/03/2019   Sinusitis 01/18/2019   Chronic constipation 01/10/2019   Flu-like symptoms 10/31/2018   Nonallopathic lesion of thoracic region 10/11/2018   Nonallopathic lesion of rib cage 10/11/2018   Nonallopathic lesion of cervical region 10/11/2018   Tonsil stone 06/17/2018   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[place]) 06/14/2018   Pharyngitis 05/30/2018   Chronic tonsillitis 05/30/2018   Left shoulder pain 05/30/2018   Fatigue 07/01/2016   Toe pain, bilateral 02/14/2016   Pain in limb 02/14/2016   Diabetes ([place]) 02/14/2016   Anxiety state 01/02/2015   Varicose veins of lower extremities with other complications [id number]   Diastolic dysfunction [id number]   Peripheral edema 04/05/2013   Tenosynovitis of forearm 10/30/2012   Pain, upper back 10/30/2012   Hyperlipidemia 06/29/2011   Encounter for well adult exam with abnormal findings 12/26/2010   OSTEOARTHRITIS, KNEE, LEFT 07/14/2010   BACK PAIN, LUMBAR 02/21/2010   HYPERSOMNIA 07/17/2009   Rash 02/05/2009   BREAST MASS 06/27/2008   Allergic rhinitis 05/31/2008   Asthma 05/31/2008   GERD 05/31/2008    Past Medical History:  Diagnosis Date   Allergy    Asthma    Diabetes mellitus without complication (HCC)    DJD (degenerative joint disease) of knee    LEFT   Endometriosis    Fibrocystic breast disease    GERD (gastroesophageal reflux disease)    History of endometriosis    Hyperlipidemia 06/29/2011   Preeclampsia     Past Surgical History:  Procedure Laterality Date   ABDOMINAL ADHESION SURGERY  07/2004   CESAREAN SECTION  02/2004   DILATION AND CURETTAGE, DIAGNOSTIC / THERAPEUTIC     PARTIAL HYSTERECTOMY     still have ovaries    Social History    Socioeconomic History   Marital status: Legally Separated    Spouse name: Not on file   Number of children: 2   Years of education: Not on file   Highest education level: Not on file  Occupational History   Occupation: Pharmacist, hospital  Tobacco Use   Smoking status: Never   Smokeless tobacco: Never  Vaping Use   Vaping Use: Never used  Substance and Sexual Activity   Alcohol use: No   Drug use: No   Sexual activity: Not on file  Other Topics Concern   Not on file  Social History Narrative   Not on file   Social Determinants of Health   Financial Resource Strain: Not on file  Food Insecurity: Not on file  Transportation Needs: Not on file  Physical Activity: Not on file  Stress: Not on file  Social Connections: Not on file  Intimate Partner Violence: Not on file  Family History  Problem Relation Age of Onset   Diabetes Mother    Hypertension Mother    Asthma Mother    Allergies Mother    Breast cancer Mother    Diabetes Father    Heart disease Paternal Uncle    Asthma Maternal Grandmother    Allergies Maternal Grandmother    Breast cancer Maternal Grandmother    Cancer Paternal Grandmother        Brain and Lung   Breast cancer Maternal Aunt    Colon cancer Neg Hx    Esophageal cancer Neg Hx    Stomach cancer Neg Hx    Rectal cancer Neg Hx      Review of Systems  Constitutional: Negative.  Negative for chills and fever.  HENT:  Positive for congestion and sinus pain.   Respiratory:  Positive for cough. Negative for sputum production.   Cardiovascular:  Negative for chest pain and palpitations.  Gastrointestinal:  Negative for abdominal pain, nausea and vomiting.  Genitourinary: Negative.   Skin: Negative.  Negative for rash.  Neurological:  Negative for dizziness and headaches.  All other systems reviewed and are negative.  Today's Vitals   10/13/22 1340  BP: 112/72  Pulse: (!) 101  Temp: 98.6 F (37 C)  TempSrc: Oral  SpO2: 98%  Weight: 185 lb 8  oz (84.1 kg)  Height: 5' 5"$  (1.651 m)   Body mass index is 30.87 kg/m.   Physical Exam Vitals reviewed.  Constitutional:      Appearance: Normal appearance.  HENT:     Head: Normocephalic.     Right Ear: Tympanic membrane, ear canal and external ear normal.     Left Ear: Tympanic membrane, ear canal and external ear normal.     Nose: Congestion present.     Right Sinus: Maxillary sinus tenderness present.     Left Sinus: Maxillary sinus tenderness present.     Mouth/Throat:     Mouth: Mucous membranes are moist.     Pharynx: Oropharynx is clear.  Eyes:     Extraocular Movements: Extraocular movements intact.     Conjunctiva/sclera: Conjunctivae normal.     Pupils: Pupils are equal, round, and reactive to light.  Cardiovascular:     Rate and Rhythm: Normal rate and regular rhythm.     Pulses: Normal pulses.     Heart sounds: Normal heart sounds.  Pulmonary:     Effort: Pulmonary effort is normal.     Breath sounds: Normal breath sounds.  Musculoskeletal:     Cervical back: No tenderness.  Lymphadenopathy:     Cervical: No cervical adenopathy.  Skin:    General: Skin is warm and dry.     Capillary Refill: Capillary refill takes less than 2 seconds.  Neurological:     General: No focal deficit present.     Mental Status: She is alert and oriented to person, place, and time.  Psychiatric:        Mood and Affect: Mood normal.        Behavior: Behavior normal.      ASSESSMENT & PLAN: A total of 33 minutes was spent with the patient and counseling/coordination of care regarding preparing for this visit, review of most recent office visit notes, review of chronic medical problems, review of all medications, diagnosis of acute sinusitis and need for antibiotic treatment, treatment of sinus congestion, prognosis, documentation, and need for follow-up if no better or worse during the next several days..  Problem List Items  Addressed This Visit       Respiratory    Sinusitis - Primary    Viral respiratory infection now with secondary bacterial sinus infection.  Will benefit from Augmentin twice a day for 7 days.      Relevant Medications   amoxicillin-clavulanate (AUGMENTIN) 875-125 MG tablet   Sinus congestion    May take over-the-counter decongestants. Recommend to use saline nasal spray frequently during the day Advised to stay well-hydrated and rest.      Patient Instructions  Sinus Infection, Adult A sinus infection is soreness and swelling (inflammation) of your sinuses. Sinuses are hollow spaces in the bones around your face. They are located: Around your eyes. In the middle of your forehead. Behind your nose. In your cheekbones. Your sinuses and nasal passages are lined with a fluid called mucus. Mucus drains out of your sinuses. Swelling can trap mucus in your sinuses. This lets germs (bacteria, virus, or fungus) grow, which leads to infection. Most of the time, this condition is caused by a virus. What are the causes? Allergies. Asthma. Germs. Things that block your nose or sinuses. Growths in the nose (nasal polyps). Chemicals or irritants in the air. A fungus. This is rare. What increases the risk? Having a weak body defense system (immune system). Doing a lot of swimming or diving. Using nasal sprays too much. Smoking. What are the signs or symptoms? The main symptoms of this condition are pain and a feeling of pressure around the sinuses. Other symptoms include: Stuffy nose (congestion). This may make it hard to breathe through your nose. Runny nose (drainage). Soreness, swelling, and warmth in the sinuses. A cough that may get worse at night. Being unable to smell and taste. Mucus that collects in the throat or the back of the nose (postnasal drip). This may cause a sore throat or bad breath. Being very tired (fatigued). A fever. How is this diagnosed? Your symptoms. Your medical history. A physical exam. Tests to  find out if your condition is short-term (acute) or long-term (chronic). Your doctor may: Check your nose for growths (polyps). Check your sinuses using a tool that has a light on one end (endoscope). Check for allergies or germs. Do imaging tests, such as an MRI or CT scan. How is this treated? Treatment for this condition depends on the cause and whether it is short-term or long-term. If caused by a virus, your symptoms should go away on their own within 10 days. You may be given medicines to relieve symptoms. They include: Medicines that shrink swollen tissue in the nose. A spray that treats swelling of the nostrils. Rinses that help get rid of thick mucus in your nose (nasal saline washes). Medicines that treat allergies (antihistamines). Over-the-counter pain relievers. If caused by bacteria, your doctor may wait to see if you will get better without treatment. You may be given antibiotic medicine if you have: A very bad infection. A weak body defense system. If caused by growths in the nose, surgery may be needed. Follow these instructions at home: Medicines Take, use, or apply over-the-counter and prescription medicines only as told by your doctor. These may include nasal sprays. If you were prescribed an antibiotic medicine, take it as told by your doctor. Do not stop taking it even if you start to feel better. Hydrate and humidify  Drink enough water to keep your pee (urine) pale yellow. Use a cool mist humidifier to keep the humidity level in your home above 50%. Breathe in  steam for 10-15 minutes, 3-4 times a day, or as told by your doctor. You can do this in the bathroom while a hot shower is running. Try not to spend time in cool or dry air. Rest Rest as much as you can. Sleep with your head raised (elevated). Make sure you get enough sleep each night. General instructions  Put a warm, moist washcloth on your face 3-4 times a day, or as often as told by your doctor. Use  nasal saline washes as often as told by your doctor. Wash your hands often with soap and water. If you cannot use soap and water, use hand sanitizer. Do not smoke. Avoid being around people who are smoking (secondhand smoke). Keep all follow-up visits. Contact a doctor if: You have a fever. Your symptoms get worse. Your symptoms do not get better within 10 days. Get help right away if: You have a very bad headache. You cannot stop vomiting. You have very bad pain or swelling around your face or eyes. You have trouble seeing. You feel confused. Your neck is stiff. You have trouble breathing. These symptoms may be an emergency. Get help right away. Call 911. Do not wait to see if the symptoms will go away. Do not drive yourself to the hospital. Summary A sinus infection is swelling of your sinuses. Sinuses are hollow spaces in the bones around your face. This condition is caused by tissues in your nose that become inflamed or swollen. This traps germs. These can lead to infection. If you were prescribed an antibiotic medicine, take it as told by your doctor. Do not stop taking it even if you start to feel better. Keep all follow-up visits. This information is not intended to replace advice given to you by your health care provider. Make sure you discuss any questions you have with your health care provider. Document Revised: 07/22/2021 Document Reviewed: 07/22/2021 Elsevier Patient Education  2023 [place], MD [facility]

## 2022-10-13 NOTE — Assessment & Plan Note (Signed)
May take over-the-counter decongestants. Recommend to use saline nasal spray frequently during the day Advised to stay well-hydrated and rest.

## 2023-01-26 ENCOUNTER — Telehealth: Payer: Self-pay | Admitting: Internal Medicine

## 2023-01-26 NOTE — Telephone Encounter (Signed)
Pt is calling and would like to switch to dr banks. Please advise

## 2023-01-26 NOTE — Telephone Encounter (Signed)
Ok with me. thanks 

## 2023-01-29 NOTE — Telephone Encounter (Signed)
Ok

## 2023-02-01 ENCOUNTER — Telehealth: Payer: Self-pay | Admitting: Internal Medicine

## 2023-02-01 NOTE — Telephone Encounter (Signed)
disregard

## 2023-02-01 NOTE — Telephone Encounter (Signed)
Lmom for pt to call back. 

## 2023-02-01 NOTE — Telephone Encounter (Signed)
Pt called to sched her TOC appt however the very next one is Dec. 18th at 4pm (which she sched). Pt was wondering if she can be seen earlier around June since she is a diabetic pt and unable to wait that long without a PCP just in case she gets sick.   Please advise.

## 2023-02-10 NOTE — Telephone Encounter (Signed)
Will check with scheduling regarding appt.  Pt would not be without a provider a she can still see her current pcp and is followed by Endo.

## 2023-04-06 ENCOUNTER — Encounter: Payer: Self-pay | Admitting: Internal Medicine

## 2023-04-06 ENCOUNTER — Other Ambulatory Visit: Payer: Self-pay | Admitting: Nurse Practitioner

## 2023-04-06 ENCOUNTER — Ambulatory Visit: Payer: BC Managed Care – PPO | Admitting: Internal Medicine

## 2023-04-06 VITALS — BP 110/70 | HR 77 | Ht 65.0 in | Wt 192.0 lb

## 2023-04-06 DIAGNOSIS — E1165 Type 2 diabetes mellitus with hyperglycemia: Secondary | ICD-10-CM

## 2023-04-06 DIAGNOSIS — Z7984 Long term (current) use of oral hypoglycemic drugs: Secondary | ICD-10-CM | POA: Diagnosis not present

## 2023-04-06 LAB — POCT GLYCOSYLATED HEMOGLOBIN (HGB A1C): Hemoglobin A1C: 6.5 % — AB (ref 4.0–5.6)

## 2023-04-06 MED ORDER — RYBELSUS 14 MG PO TABS: 14.0000 mg | ORAL_TABLET | Freq: Every day | ORAL | 3 refills | Status: AC

## 2023-04-06 NOTE — Patient Instructions (Signed)
Increase Rybelsus 14 mg tablets daily  Continue Jardiance 25 mg daily    HOW TO TREAT LOW BLOOD SUGARS (Blood sugar LESS THAN 70 MG/DL) Please follow the RULE OF 15 for the treatment of hypoglycemia treatment (when your (blood sugars are less than 70 mg/dL)   STEP 1: Take 15 grams of carbohydrates when your blood sugar is low, which includes:  3-4 GLUCOSE TABS  OR 3-4 OZ OF JUICE OR REGULAR SODA OR ONE TUBE OF GLUCOSE GEL    STEP 2: RECHECK blood sugar in 15 MINUTES STEP 3: If your blood sugar is still low at the 15 minute recheck --> then, go back to STEP 1 and treat AGAIN with another 15 grams of carbohydrates.

## 2023-04-06 NOTE — Progress Notes (Signed)
Name: Claire Irwin  Age/ Sex: 52 y.o., female   MRN/ DOB: [id number], 10-Jan-1971     PCP: Corwin Levins, MD   Reason for Endocrinology Evaluation: Type 2 Diabetes Mellitus  Initial Endocrine Consultative Visit: 07/29/2016    PATIENT IDENTIFIER: Claire Irwin is a 52 y.o. female with a past medical history of T2DM, asthma, dyslipidemia. The patient has followed with Endocrinology clinic since 07/29/2016 for consultative assistance with management of her diabetes.  DIABETIC HISTORY:  Claire Irwin was diagnosed with DM 2017, intolerant to metformin-abdominal pain, intolerant to Weyman Croon, tried Rybelsus just a couple of days only because she was afraid of hypoglycemia. Marland Kitchen Her hemoglobin A1c has ranged from 5.8% in 2019, peaking at 7.1% in 2023.  Patient followed up with Claire Irwin from 2017 until April 2023 SUBJECTIVE:   During the last visit (10/05/2022): A1c 6.3%  Today (04/06/2023): Claire Irwin is here for follow-up on diabetes management.  She checks her blood sugars occasionally .   Has IBS-C , uses linzess as needed  Denies nausea and vomiting   She has noted pruritus at the plantar surface bilaterally, she follows occasionally with podiatry, was told possibly a fungus, no prescription was sent   HOME DIABETES REGIMEN:  Jardiance 25 mg daily Rybelsus 7 mg daily     Statin: No ACE-I/ARB: No   METER DOWNLOAD SUMMARY: 7/8-04/06/2023 Fingerstick Blood Glucose Tests = 10 Overall Mean FS Glucose = 142 Standard Deviation = 14  BG Ranges: Low = 123 High = 161  BG Target % Results: % In target = 100 % Over target = 0 % Under target = 0  Hypoglycemic Events/30 Days: BG < 50 = 0 Episodes of symptomatic severe hypoglycemia = 0    DIABETIC COMPLICATIONS: Microvascular complications:   Denies: CKD, retinopathy , neuropathy Last Eye Exam: Completed 03/17/2023  Macrovascular complications:   Denies: CAD, CVA, PVD   HISTORY:  Past Medical History:   Past Medical History:  Diagnosis Date   Allergy    Asthma    Diabetes mellitus without complication (HCC)    DJD (degenerative joint disease) of knee    LEFT   Endometriosis    Fibrocystic breast disease    GERD (gastroesophageal reflux disease)    History of endometriosis    Hyperlipidemia 06/29/2011   Preeclampsia    Past Surgical History:  Past Surgical History:  Procedure Laterality Date   ABDOMINAL ADHESION SURGERY  07/2004   CESAREAN SECTION  02/2004   DILATION AND CURETTAGE, DIAGNOSTIC / THERAPEUTIC     PARTIAL HYSTERECTOMY     still have ovaries   Social History:  reports that she has never smoked. She has never used smokeless tobacco. She reports that she does not drink alcohol and does not use drugs. Family History:  Family History  Problem Relation Age of Onset   Diabetes Mother    Hypertension Mother    Asthma Mother    Allergies Mother    Breast cancer Mother    Diabetes Father    Heart disease Paternal Uncle    Asthma Maternal Grandmother    Allergies Maternal Grandmother    Breast cancer Maternal Grandmother    Cancer Paternal Grandmother        Brain and Lung   Breast cancer Maternal Aunt    Colon cancer Neg Hx    Esophageal cancer Neg Hx    Stomach cancer Neg Hx    Rectal cancer Neg Hx      HOME  MEDICATIONS: Allergies as of 04/06/2023       Reactions   Hydrocodone    Pt reports it felt like she was spinning while taking.    Sulfonamide Derivatives         Medication List        Accurate as of April 06, 2023 12:33 PM. If you have any questions, ask your nurse or doctor.          Advair Diskus 250-50 MCG/ACT Aepb Generic drug: fluticasone-salmeterol Inhale 1 puff into the lungs daily.   albuterol 108 (90 Base) MCG/ACT inhaler Commonly known as: VENTOLIN HFA Inhale 2 puffs into the lungs every 4 (four) hours as needed for wheezing or shortness of breath.   ALLEGRA PO Take by mouth.   aspirin EC 81 MG tablet Take 1 tablet  (81 mg total) by mouth daily.   [facility] Olopatadine HCl 0.2 % Soln Generic drug: Olopatadine HCl PLACE 1 DROP INTO BOTH EYES DAILY AS NEEDED.   empagliflozin 25 MG Tabs tablet Commonly known as: Jardiance Take 1 tablet (25 mg total) by mouth daily before breakfast.   loratadine 10 MG tablet Commonly known as: CLARITIN Take 10 mg by mouth daily as needed for allergies.   MILK OF MAGNESIA PO Take 1 Dose by mouth as needed.   montelukast 10 MG tablet Commonly known as: Singulair Take 1 tablet (10 mg total) by mouth at bedtime. What changed:  when to take this reasons to take this   Motegrity 2 MG Tabs Generic drug: Prucalopride Succinate Take 1 tablet (2 mg total) by mouth daily.   NASACORT ALLERGY 24HR NA Place 1 spray into the nose daily.   norethindrone 0.35 MG tablet Commonly known as: MICRONOR Take 1 tablet by mouth daily.   OneTouch Delica Lancets 33G Misc Check blood sugar 3 times  a week   OneTouch Ultra test strip Generic drug: glucose blood 1 each by Other route daily in the afternoon. Use as instructed   PROBIOTIC DAILY PO Take 1 capsule by mouth daily.   rifaximin 550 MG Tabs tablet Commonly known as: XIFAXAN Take 1 tablet (550 mg total) by mouth 3 (three) times daily.   Ryaltris 161-09 MCG/ACT Susp Generic drug: Olopatadine-Mometasone Place 2 sprays twice a day as needed   Rybelsus 7 MG Tabs Generic drug: Semaglutide Take 1 tablet (7 mg total) by mouth daily.   triamcinolone cream 0.1 % Commonly known as: KENALOG Apply 1 Application topically 2 (two) times daily.         OBJECTIVE:   Vital Signs: BP 110/70 (BP Location: Left Arm, Patient Position: Sitting, Cuff Size: Large)   Pulse 77   Ht 5\' 5"  (1.651 m)   Wt 192 lb (87.1 kg)   SpO2 99%   BMI 31.95 kg/m   Wt Readings from Last 3 Encounters:  04/06/23 192 lb (87.1 kg)  10/13/22 185 lb 8 oz (84.1 kg)  10/05/22 189 lb (85.7 kg)     Exam: General: Pt appears well and is in NAD   Neck: General: Supple without adenopathy. Thyroid: Thyroid size normal.  No goiter or nodules appreciated.   Lungs: Clear with good BS bilat   Heart: RRR   Abdomen:  soft, nontender  Extremities: No pretibial edema.   Neuro: MS is good with appropriate affect, pt is alert and Ox3    DM foot exam: 04/06/2023  The skin of the feet is intact without sores or ulcerations. The pedal pulses are 2+ on right and  2+ on left. The sensation is intact to a screening 5.07, 10 gram monofilament bilaterally     DATA REVIEWED:  Lab Results  Component Value Date   HGBA1C 6.5 (A) 04/06/2023   HGBA1C 6.3 (A) 10/05/2022   HGBA1C 7.1 (H) 07/22/2022   Lab Results  Component Value Date   MICROALBUR <0.7 07/22/2022   LDLCALC 93 07/22/2022   CREATININE 1.05 07/22/2022   Lab Results  Component Value Date   MICRALBCREAT 1.1 07/22/2022     Lab Results  Component Value Date   CHOL 168 07/22/2022   HDL 62.40 07/22/2022   LDLCALC 93 07/22/2022   TRIG 64.0 07/22/2022   CHOLHDL 3 07/22/2022         ASSESSMENT / PLAN / RECOMMENDATIONS:   1) Type 2 Diabetes Mellitus, Optimally controlled, Without complications - Most recent A1c of 6.5%. Goal A1c <7.0%.    -Her A1c continues to be at goal, but it did increase from 6.3% to 6.5% -She was also noted with weight gain -I have recommended increasing Rybelsus based on the above information -We will continue Jardiance  MEDICATIONS: -Increase Rybelsus 14 mg daily  - Continue Jardiance 25 mg daily  EDUCATION / INSTRUCTIONS: BG monitoring instructions: Patient is instructed to check her blood sugars 2-3 times a week. Call [facility] if: BG persistently < 70  I reviewed the Rule of 15 for the treatment of hypoglycemia in detail with the patient. Literature supplied.    2) Diabetic complications:  Eye: Does not have known diabetic retinopathy.  Neuro/ Feet: Does not have known diabetic peripheral neuropathy .  Renal: Patient  does not  have known baseline CKD. She   is not on an ACEI/ARB at present.    3) Pruritus:  -No lesions at the plantar surface bilaterally, I did advise the patient to start wearing 100% cotton socks and see if that would make a difference   F/U in 6 months   Signed electronically by: Lyndle Herrlich, MD  [facility]  [facility] [address] [place], [place] [address] Phone: [phone] FAX: [phone]   CC: Corwin Levins, MD [address] [place] [address] Phone: [phone]  Fax: [phone]  Return to Endocrinology clinic as below: Future Appointments  Date Time Provider Department Center  07/26/2023  9:00 AM Corwin Levins, MD [facility] None  08/18/2023  4:00 PM Deeann Saint, MD [facility]

## 2023-05-06 ENCOUNTER — Ambulatory Visit: Payer: BC Managed Care – PPO | Admitting: Podiatry

## 2023-05-24 ENCOUNTER — Other Ambulatory Visit (HOSPITAL_COMMUNITY): Payer: Self-pay | Admitting: Family Medicine

## 2023-05-24 DIAGNOSIS — R55 Syncope and collapse: Secondary | ICD-10-CM

## 2023-06-11 ENCOUNTER — Ambulatory Visit (HOSPITAL_COMMUNITY): Payer: BC Managed Care – PPO | Attending: Family Medicine

## 2023-06-11 DIAGNOSIS — R55 Syncope and collapse: Secondary | ICD-10-CM | POA: Diagnosis present

## 2023-06-11 LAB — ECHOCARDIOGRAM COMPLETE
Area-P 1/2: 3.81 cm2
S' Lateral: 2 cm

## 2023-06-14 ENCOUNTER — Other Ambulatory Visit (HOSPITAL_COMMUNITY): Payer: BC Managed Care – PPO

## 2023-07-20 ENCOUNTER — Telehealth: Payer: Self-pay

## 2023-07-20 ENCOUNTER — Other Ambulatory Visit (HOSPITAL_COMMUNITY): Payer: Self-pay

## 2023-07-20 NOTE — Telephone Encounter (Signed)
Pharmacy Patient Advocate Encounter   Received notification from CoverMyMeds that prior authorization for Rybelsus 14MG  tablets is required/requested.   Insurance verification completed.   The patient is insured through [employer] .   Per test claim: PA required; PA submitted to above mentioned insurance via CoverMyMeds Key/confirmation #/EOC [id number] Status is pending

## 2023-07-21 NOTE — Telephone Encounter (Signed)
Pharmacy Patient Advocate Encounter  Received notification from [employer] [facility] that Prior Authorization for Rybelsus has been APPROVED from 07-20-2023 to 07-19-2026   PA #/Case ID/Reference #: [id number]

## 2023-07-26 ENCOUNTER — Encounter: Payer: BC Managed Care – PPO | Admitting: Internal Medicine

## 2023-08-18 ENCOUNTER — Encounter: Payer: BC Managed Care – PPO | Admitting: Family Medicine

## 2023-10-02 ENCOUNTER — Other Ambulatory Visit: Payer: Self-pay | Admitting: Internal Medicine

## 2023-10-11 ENCOUNTER — Ambulatory Visit: Payer: BC Managed Care – PPO | Admitting: Internal Medicine

## 2024-04-16 ENCOUNTER — Other Ambulatory Visit: Payer: Self-pay | Admitting: Internal Medicine

## 2024-05-24 ENCOUNTER — Other Ambulatory Visit: Payer: Self-pay | Admitting: Obstetrics and Gynecology

## 2024-05-24 DIAGNOSIS — N644 Mastodynia: Secondary | ICD-10-CM

## 2024-06-09 ENCOUNTER — Other Ambulatory Visit: Payer: Self-pay | Admitting: Obstetrics and Gynecology

## 2024-06-09 ENCOUNTER — Ambulatory Visit: Payer: Self-pay

## 2024-06-09 ENCOUNTER — Ambulatory Visit
Admission: RE | Admit: 2024-06-09 | Discharge: 2024-06-09 | Disposition: A | Discharge status: Home / Self Care | Source: Ambulatory Visit | Attending: Obstetrics and Gynecology | Admitting: Obstetrics and Gynecology

## 2024-06-09 DIAGNOSIS — N644 Mastodynia: Secondary | ICD-10-CM

## 2024-06-12 ENCOUNTER — Other Ambulatory Visit: Payer: Self-pay

## 2024-06-12 ENCOUNTER — Encounter

## 2024-10-19 ENCOUNTER — Ambulatory Visit (HOSPITAL_COMMUNITY): Admit: 2024-10-19 | Admitting: Obstetrics and Gynecology

## 2024-10-19 DIAGNOSIS — R1900 Intra-abdominal and pelvic swelling, mass and lump, unspecified site: Secondary | ICD-10-CM
